# Patient Record
Sex: Male | Born: 1970
Health system: Southern US, Community
[De-identification: ages and names within clinical notes are randomized; demographics above are authoritative.]

## PROBLEM LIST (undated history)

## (undated) DIAGNOSIS — J189 Pneumonia, unspecified organism: Secondary | ICD-10-CM

## (undated) DIAGNOSIS — I2510 Atherosclerotic heart disease of native coronary artery without angina pectoris: Secondary | ICD-10-CM

## (undated) DIAGNOSIS — Z8619 Personal history of other infectious and parasitic diseases: Secondary | ICD-10-CM

## (undated) DIAGNOSIS — I219 Acute myocardial infarction, unspecified: Secondary | ICD-10-CM

## (undated) DIAGNOSIS — C801 Malignant (primary) neoplasm, unspecified: Secondary | ICD-10-CM

## (undated) DIAGNOSIS — K219 Gastro-esophageal reflux disease without esophagitis: Secondary | ICD-10-CM

## (undated) HISTORY — PX: BACK SURGERY: SHX140

## (undated) HISTORY — PX: CARDIAC CATHETERIZATION: SHX172

## (undated) SURGERY — COLONOSCOPY WITH PROPOFOL
Anesthesia: Monitor Anesthesia Care

---

## 1999-09-09 ENCOUNTER — Encounter: Payer: Self-pay | Admitting: Gastroenterology

## 1999-09-09 ENCOUNTER — Ambulatory Visit (HOSPITAL_COMMUNITY): Admission: RE | Admit: 1999-09-09 | Discharge: 1999-09-09 | Payer: Self-pay | Admitting: Gastroenterology

## 2006-07-20 ENCOUNTER — Encounter: Admission: RE | Admit: 2006-07-20 | Discharge: 2006-07-20 | Payer: Self-pay | Admitting: Gastroenterology

## 2013-09-12 ENCOUNTER — Other Ambulatory Visit: Payer: Self-pay | Admitting: Physician Assistant

## 2013-09-12 ENCOUNTER — Ambulatory Visit
Admission: RE | Admit: 2013-09-12 | Discharge: 2013-09-12 | Disposition: A | Discharge status: Home / Self Care | Payer: 59 | Source: Ambulatory Visit | Attending: Physician Assistant | Admitting: Physician Assistant

## 2013-09-12 DIAGNOSIS — R0789 Other chest pain: Secondary | ICD-10-CM

## 2014-07-07 ENCOUNTER — Ambulatory Visit
Admission: RE | Admit: 2014-07-07 | Discharge: 2014-07-07 | Disposition: A | Discharge status: Home / Self Care | Payer: 59 | Source: Ambulatory Visit | Attending: Family Medicine | Admitting: Family Medicine

## 2014-07-07 ENCOUNTER — Other Ambulatory Visit: Payer: Self-pay | Admitting: Family Medicine

## 2014-07-07 DIAGNOSIS — R0781 Pleurodynia: Secondary | ICD-10-CM

## 2014-07-08 ENCOUNTER — Other Ambulatory Visit: Payer: Self-pay | Admitting: Family Medicine

## 2014-07-08 DIAGNOSIS — M546 Pain in thoracic spine: Secondary | ICD-10-CM

## 2014-07-15 ENCOUNTER — Ambulatory Visit
Admission: RE | Admit: 2014-07-15 | Discharge: 2014-07-15 | Disposition: A | Discharge status: Home / Self Care | Payer: 59 | Source: Ambulatory Visit | Attending: Family Medicine | Admitting: Family Medicine

## 2014-07-15 DIAGNOSIS — M546 Pain in thoracic spine: Secondary | ICD-10-CM

## 2014-08-05 ENCOUNTER — Other Ambulatory Visit: Payer: 59

## 2016-01-15 ENCOUNTER — Other Ambulatory Visit: Payer: Self-pay | Admitting: Family Medicine

## 2016-01-15 DIAGNOSIS — M5431 Sciatica, right side: Secondary | ICD-10-CM

## 2016-01-17 ENCOUNTER — Other Ambulatory Visit: Payer: Self-pay

## 2016-01-21 ENCOUNTER — Ambulatory Visit
Admission: RE | Admit: 2016-01-21 | Discharge: 2016-01-21 | Disposition: A | Discharge status: Home / Self Care | Payer: Commercial Managed Care - HMO | Source: Ambulatory Visit | Attending: Family Medicine | Admitting: Family Medicine

## 2016-01-21 DIAGNOSIS — M5431 Sciatica, right side: Secondary | ICD-10-CM

## 2016-10-04 DIAGNOSIS — M5126 Other intervertebral disc displacement, lumbar region: Secondary | ICD-10-CM | POA: Diagnosis not present

## 2016-10-10 ENCOUNTER — Other Ambulatory Visit: Payer: Self-pay | Admitting: Family Medicine

## 2016-10-10 DIAGNOSIS — M5431 Sciatica, right side: Secondary | ICD-10-CM

## 2016-10-12 DIAGNOSIS — Z0000 Encounter for general adult medical examination without abnormal findings: Secondary | ICD-10-CM | POA: Diagnosis not present

## 2016-10-12 DIAGNOSIS — Z13220 Encounter for screening for lipoid disorders: Secondary | ICD-10-CM | POA: Diagnosis not present

## 2016-10-20 DIAGNOSIS — M5126 Other intervertebral disc displacement, lumbar region: Secondary | ICD-10-CM | POA: Diagnosis not present

## 2016-11-03 DIAGNOSIS — Z01812 Encounter for preprocedural laboratory examination: Secondary | ICD-10-CM | POA: Diagnosis not present

## 2016-11-03 DIAGNOSIS — M5126 Other intervertebral disc displacement, lumbar region: Secondary | ICD-10-CM | POA: Diagnosis not present

## 2016-11-08 DIAGNOSIS — M5127 Other intervertebral disc displacement, lumbosacral region: Secondary | ICD-10-CM | POA: Diagnosis not present

## 2016-11-08 DIAGNOSIS — M5117 Intervertebral disc disorders with radiculopathy, lumbosacral region: Secondary | ICD-10-CM | POA: Diagnosis not present

## 2018-04-17 DIAGNOSIS — L237 Allergic contact dermatitis due to plants, except food: Secondary | ICD-10-CM | POA: Diagnosis not present

## 2018-10-02 DIAGNOSIS — M2550 Pain in unspecified joint: Secondary | ICD-10-CM | POA: Diagnosis not present

## 2018-10-02 DIAGNOSIS — Z79899 Other long term (current) drug therapy: Secondary | ICD-10-CM | POA: Diagnosis not present

## 2018-10-02 DIAGNOSIS — R5382 Chronic fatigue, unspecified: Secondary | ICD-10-CM | POA: Diagnosis not present

## 2018-10-18 DIAGNOSIS — N529 Male erectile dysfunction, unspecified: Secondary | ICD-10-CM | POA: Diagnosis not present

## 2019-08-05 ENCOUNTER — Emergency Department (HOSPITAL_COMMUNITY): Payer: 59

## 2019-08-05 ENCOUNTER — Ambulatory Visit (HOSPITAL_COMMUNITY)
Admission: EM | Admit: 2019-08-05 | Discharge: 2019-08-05 | Disposition: A | Discharge status: Home / Self Care | Payer: 59 | Source: Home / Self Care

## 2019-08-05 ENCOUNTER — Encounter (HOSPITAL_COMMUNITY): Payer: Self-pay | Admitting: Family Medicine

## 2019-08-05 ENCOUNTER — Other Ambulatory Visit: Payer: Self-pay

## 2019-08-05 ENCOUNTER — Encounter (HOSPITAL_COMMUNITY): Payer: Self-pay | Admitting: Emergency Medicine

## 2019-08-05 ENCOUNTER — Inpatient Hospital Stay (HOSPITAL_COMMUNITY)
Admission: EM | Admit: 2019-08-05 | Discharge: 2019-08-08 | DRG: 247 | Disposition: A | Discharge status: Home / Self Care | Payer: 59 | Source: Ambulatory Visit | Attending: Internal Medicine | Admitting: Internal Medicine

## 2019-08-05 DIAGNOSIS — I2510 Atherosclerotic heart disease of native coronary artery without angina pectoris: Secondary | ICD-10-CM

## 2019-08-05 DIAGNOSIS — Z8249 Family history of ischemic heart disease and other diseases of the circulatory system: Secondary | ICD-10-CM

## 2019-08-05 DIAGNOSIS — I10 Essential (primary) hypertension: Secondary | ICD-10-CM | POA: Diagnosis present

## 2019-08-05 DIAGNOSIS — E785 Hyperlipidemia, unspecified: Secondary | ICD-10-CM | POA: Diagnosis present

## 2019-08-05 DIAGNOSIS — I200 Unstable angina: Secondary | ICD-10-CM

## 2019-08-05 DIAGNOSIS — I25110 Atherosclerotic heart disease of native coronary artery with unstable angina pectoris: Secondary | ICD-10-CM | POA: Diagnosis present

## 2019-08-05 DIAGNOSIS — K449 Diaphragmatic hernia without obstruction or gangrene: Secondary | ICD-10-CM | POA: Diagnosis present

## 2019-08-05 DIAGNOSIS — I214 Non-ST elevation (NSTEMI) myocardial infarction: Secondary | ICD-10-CM | POA: Diagnosis not present

## 2019-08-05 DIAGNOSIS — I255 Ischemic cardiomyopathy: Secondary | ICD-10-CM | POA: Diagnosis present

## 2019-08-05 DIAGNOSIS — Z955 Presence of coronary angioplasty implant and graft: Secondary | ICD-10-CM

## 2019-08-05 DIAGNOSIS — Z20828 Contact with and (suspected) exposure to other viral communicable diseases: Secondary | ICD-10-CM | POA: Diagnosis present

## 2019-08-05 DIAGNOSIS — R9431 Abnormal electrocardiogram [ECG] [EKG]: Secondary | ICD-10-CM

## 2019-08-05 DIAGNOSIS — Z823 Family history of stroke: Secondary | ICD-10-CM

## 2019-08-05 DIAGNOSIS — I472 Ventricular tachycardia: Secondary | ICD-10-CM | POA: Diagnosis present

## 2019-08-05 DIAGNOSIS — J302 Other seasonal allergic rhinitis: Secondary | ICD-10-CM | POA: Diagnosis present

## 2019-08-05 HISTORY — DX: Gastro-esophageal reflux disease without esophagitis: K21.9

## 2019-08-05 HISTORY — DX: Atherosclerotic heart disease of native coronary artery without angina pectoris: I25.10

## 2019-08-05 HISTORY — DX: Personal history of other infectious and parasitic diseases: Z86.19

## 2019-08-05 LAB — CBC
HCT: 45.5 % (ref 39.0–52.0)
Hemoglobin: 15 g/dL (ref 13.0–17.0)
MCH: 30.7 pg (ref 26.0–34.0)
MCHC: 33 g/dL (ref 30.0–36.0)
MCV: 93.2 fL (ref 80.0–100.0)
Platelets: 232 10*3/uL (ref 150–400)
RBC: 4.88 MIL/uL (ref 4.22–5.81)
RDW: 13.1 % (ref 11.5–15.5)
WBC: 8.5 10*3/uL (ref 4.0–10.5)
nRBC: 0 % (ref 0.0–0.2)

## 2019-08-05 LAB — BASIC METABOLIC PANEL
Anion gap: 14 (ref 5–15)
BUN: 10 mg/dL (ref 6–20)
CO2: 25 mmol/L (ref 22–32)
Calcium: 9.5 mg/dL (ref 8.9–10.3)
Chloride: 103 mmol/L (ref 98–111)
Creatinine, Ser: 0.96 mg/dL (ref 0.61–1.24)
GFR calc Af Amer: >60 mL/min (ref >60)
GFR calc non Af Amer: >60 mL/min (ref >60)
Glucose, Bld: 86 mg/dL (ref 70–99)
Potassium: 4.2 mmol/L (ref 3.5–5.1)
Sodium: 142 mmol/L (ref 135–145)

## 2019-08-05 LAB — HEMOGLOBIN A1C
Hgb A1c MFr Bld: 5.6 % (ref 4.8–5.6)
Mean Plasma Glucose: 114.02 mg/dL

## 2019-08-05 LAB — TROPONIN I (HIGH SENSITIVITY)
Troponin I (High Sensitivity): 2403 ng/L (ref <18)
Troponin I (High Sensitivity): 2143 ng/L (ref <18)

## 2019-08-05 LAB — SARS CORONAVIRUS 2 (TAT 6-24 HRS): SARS Coronavirus 2: NEGATIVE

## 2019-08-05 MED ORDER — ONDANSETRON HCL 4 MG/2ML IJ SOLN: 4.0000 mg | Freq: Four times a day (QID) | INTRAMUSCULAR | Status: DC | PRN

## 2019-08-05 MED ORDER — ASPIRIN EC 81 MG PO TBEC: 81.0000 mg | DELAYED_RELEASE_TABLET | Freq: Every day | ORAL | Status: DC

## 2019-08-05 MED ORDER — ASPIRIN 81 MG PO CHEW
324.0000 mg | CHEWABLE_TABLET | Freq: Once | ORAL | Status: AC
  Administered 2019-08-05: 324 mg via ORAL
  Filled 2019-08-05: qty 4

## 2019-08-05 MED ORDER — CARVEDILOL 3.125 MG PO TABS
3.1250 mg | ORAL_TABLET | Freq: Two times a day (BID) | ORAL | Status: DC
  Filled 2019-08-05: qty 1

## 2019-08-05 MED ORDER — NITROGLYCERIN 0.4 MG SL SUBL
0.4000 mg | SUBLINGUAL_TABLET | SUBLINGUAL | Status: DC | PRN
  Administered 2019-08-05: 0.4 mg via SUBLINGUAL
  Filled 2019-08-05 (×2): qty 1

## 2019-08-05 MED ORDER — NITROGLYCERIN 0.4 MG SL SUBL: 0.4000 mg | SUBLINGUAL_TABLET | SUBLINGUAL | Status: DC | PRN

## 2019-08-05 MED ORDER — SODIUM CHLORIDE 0.9% FLUSH
3.0000 mL | Freq: Two times a day (BID) | INTRAVENOUS | Status: DC
  Administered 2019-08-05 – 2019-08-08 (×4): 3 mL via INTRAVENOUS

## 2019-08-05 MED ORDER — NITROGLYCERIN IN D5W 200-5 MCG/ML-% IV SOLN
0.0000 ug/min | INTRAVENOUS | Status: DC
  Administered 2019-08-05: 5 ug/min via INTRAVENOUS
  Filled 2019-08-05: qty 250

## 2019-08-05 MED ORDER — SODIUM CHLORIDE 0.9% FLUSH
3.0000 mL | Freq: Once | INTRAVENOUS | Status: AC
  Administered 2019-08-05: 3 mL via INTRAVENOUS

## 2019-08-05 MED ORDER — ASPIRIN 81 MG PO CHEW: 324.0000 mg | CHEWABLE_TABLET | ORAL | Status: AC

## 2019-08-05 MED ORDER — ASPIRIN 300 MG RE SUPP: 300.0000 mg | RECTAL | Status: AC

## 2019-08-05 MED ORDER — HEPARIN BOLUS VIA INFUSION
4000.0000 [IU] | Freq: Once | INTRAVENOUS | Status: AC
  Administered 2019-08-05: 4000 [IU] via INTRAVENOUS
  Filled 2019-08-05: qty 4000

## 2019-08-05 MED ORDER — HEPARIN (PORCINE) 25000 UT/250ML-% IV SOLN
800.0000 [IU]/h | INTRAVENOUS | Status: DC
  Administered 2019-08-05: 1000 [IU]/h via INTRAVENOUS
  Filled 2019-08-05: qty 250

## 2019-08-05 MED ORDER — ACETAMINOPHEN 325 MG PO TABS
650.0000 mg | ORAL_TABLET | ORAL | Status: DC | PRN
  Administered 2019-08-06: 01:00:00 650 mg via ORAL
  Filled 2019-08-05: qty 2

## 2019-08-05 NOTE — ED Provider Notes (Signed)
[name]    CSN: [id number] Arrival date & time: 08/05/19  1433      History   Chief Complaint Chief Complaint  Patient presents with  . Chest Pain    HPI Jeff Barnes is a 48 y.o. male.   This is the initial Jeff Barnes urgent care visit for this 48 year old man who is complaining about chest pain for the last 2 weeks.  pcp instructed patient to go to ed.    Patient noticed chest pain 2 weeks ago while hiking.  Patient has noticed chest pain with and without exertion.  Patient has been taking ibuprofen for pain.  Patient reports ibuprofen helps pain.    Patient reports pain initially in center chest and radiates outward to both arms and down both arms.    Patient has a hiatal hernia.   Today noticed pain with the exertion of digging.    However, when he lies down in bed at night, pain reoccurs.    Patient gets sob, diarrhea, diaphoresis during pain episodes     History reviewed. No pertinent past medical history.  There are no active problems to display for this patient.   History reviewed. No pertinent surgical history.     Home Medications    Prior to Admission medications   Medication Sig Start Date End Date Taking? Authorizing Provider  Esomeprazole Magnesium (NEXIUM PO) Take by mouth.   Yes [provider]  Fexofenadine HCl (ALLEGRA PO) Take by mouth.   Yes [provider]    Family History Family History  Problem Relation Age of Onset  . Cancer Father   . CAD Father     Social History Social History   Tobacco Use  . Smoking status: Never Smoker  Substance Use Topics  . Alcohol use: Yes  . Drug use: Never     Allergies   Patient has no known allergies.   Review of Systems Review of Systems   Physical Exam Triage Vital Signs ED Triage Vitals  Enc Vitals Group     BP      Pulse      Resp      Temp      Temp src      SpO2      Weight      Height      Head Circumference    Peak Flow      Pain Score      Pain Loc      Pain Edu?      Excl. in [place]?    No data found.  Updated Vital Signs BP (!) 160/102 (BP Location: Right Arm)   Pulse (!) 103   Temp 98.2 F (36.8 C)   Resp 18   SpO2 100%    Physical Exam Vitals signs and nursing note reviewed.  Constitutional:      General: He is not in acute distress.    Appearance: He is well-developed and normal weight. He is not ill-appearing.  HENT:     Head: Normocephalic.  Cardiovascular:     Rate and Rhythm: Tachycardia present.     Heart sounds: Normal heart sounds.  Pulmonary:     Effort: Pulmonary effort is normal.     Breath sounds: Normal breath sounds.  Abdominal:     Palpations: Abdomen is soft.  Musculoskeletal:     Right lower leg: No edema.     Left lower leg: No edema.  Skin:    General: Skin  is warm.  Neurological:     General: No focal deficit present.     Mental Status: He is alert.  Psychiatric:        Mood and Affect: Mood normal.      UC Treatments / Results  Labs (all labs ordered are listed, but only abnormal results are displayed) Labs Reviewed - No data to display  EKG   Radiology No results found.  Procedures Procedures (including critical care time)  Medications Ordered in UC Medications - No data to display  Initial Impression / Assessment and Plan / UC Course  I have reviewed the triage vital signs and the nursing notes.  Pertinent labs & imaging results that were available during my care of the patient were reviewed by me and considered in my medical decision making (see chart for details).    Final Clinical Impressions(s) / UC Diagnoses   Final diagnoses:  Unstable angina (HCC)  Abnormal EKG     Discharge Instructions     To be treated in the emergency department    ED Prescriptions    None     I have reviewed the PDMP during this encounter.   Jeff Haber, MD 08/05/19 1555

## 2019-08-05 NOTE — ED Notes (Signed)
MD paged d/t pt's pain changing sides from R chest to L chest. MD asked to increase pt's intro. RN increased from 68mcg-10mcg.

## 2019-08-05 NOTE — ED Triage Notes (Signed)
pcp instructed patient to go to ed.    Patient noticed chest pain 2 weeks ago while hiking.  Patient has noticed chest pain with and without exertion.  Patient has been taking ibuprofen for pain.  Patient reports ibuprofen helps pain.    Patient reports pain initially in center chest and radiates outward to both arms and down both arms.    Patient has a hiatal hernia.   Today noticed pain with the exertion of digging.    However, when he lies down in bed at night, pain reoccurs.    Patient gets sob, diarrhea, diaphoresis during pain episodes

## 2019-08-05 NOTE — ED Provider Notes (Signed)
4:33 PM Patient seen by me briefly in triage.  Patient is a pleasant 48 year old male with no significant past medical history who presents from urgent care for further work-up of chest discomfort.  He reports that for the last 2 weeks he has intermittent episodes of a chest discomfort that feels like sharpness and pressure.  It radiates into both of his arms.  He reports nausea, diaphoresis, lightheadedness, and shortness of breath associated with it.  Reports the pain gets very severe and is a 3 out of 10 currently.  It is exertional.  It was worsened when he was hiking.  He reports that his father had heart troubles in his early 51s but the patient has personally never had any history of DVT, PE, or cardiac problems.  He had an EKG urgent care which was concerning prompting him to be sent to the emergency department.  On my brief exam, lungs are clear and chest was nontender.  No murmurs appreciated.  Patient had good pulses in upper extremities.  Patient was not diaphoretic.  I reviewed the EKG from urgent care and then EKG performed here immediately.  I was concerned that although the ST elevations are present in V3 and V4 have normalized, there does appear to be T wave inversion and depression in lead III which is new.  I called Dr. Ellyn Hack with the cardiology STEMI team who did not feel this met STEMI criteria but he agreed with work-up in the emergency department and likely cardiology consultation following.  Patient quickly placed into an exam room to be seen primarily by Dr. Tyrone Nine.  Anticipate Dr. Tyrone Nine working up the patient and speaking to cardiology after.   Kathrine Rieves, Gwenyth Allegra, MD 08/05/19 [phone]

## 2019-08-05 NOTE — ED Notes (Signed)
Pg stemi for doctor tegeler .

## 2019-08-05 NOTE — ED Triage Notes (Signed)
Pt arrives to ED with c/c of chest pain for the last 2 weeks that has seemed to come and go- per pt today he had to dig a grave for his sons dogs and pain got a lot worse- current pain is 4/10.

## 2019-08-05 NOTE — Discharge Instructions (Addendum)
To be treated in the emergency department

## 2019-08-05 NOTE — ED Provider Notes (Signed)
[place] EMERGENCY DEPARTMENT Provider Note   CSN: [id number] Arrival date & time: 08/05/19  1556     History   Chief Complaint Chief Complaint  Patient presents with   Chest Pain    HPI Jeff Barnes is a 48 y.o. male.     48 yo M with a chief complaint of chest pain.  This has been going on for about a week.  Happen first while he was hiking.  Complains of centralized chest pressure that radiates to bilateral arms causes him have shortness of breath diaphoresis and diarrhea.  Has never had anything like this before.  Has gotten better than it was a week ago but is still persistent.  Happens with exertion and improves with rest.  Denies cough congestion or fever denies abdominal pain denies vomiting.  Denies hypertension hyperlipidemia diabetes or smoking.  Father had an MI in his 68s.  Denies history of PE or DVT denies unilateral lower extremity edema denies hemoptysis denies recent surgery immobilization hospitalization or travel.  Denies testosterone use.  Denies history of cancer.  The history is provided by the patient.  Chest Pain Pain location:  Substernal area Pain quality: aching   Pain radiates to:  R shoulder and L shoulder Pain severity:  Severe Onset quality:  Gradual Duration:  1 week Timing:  Intermittent Progression:  Waxing and waning Chronicity:  New Relieved by:  Rest Worsened by:  Exertion Ineffective treatments:  None tried Associated symptoms: diaphoresis and shortness of breath   Associated symptoms: no abdominal pain, no fever, no headache, no palpitations and no vomiting     History reviewed. No pertinent past medical history.  There are no active problems to display for this patient.   No past surgical history on file.      Home Medications    Prior to Admission medications   Medication Sig Start Date End Date Taking? Authorizing Provider  Esomeprazole Magnesium (NEXIUM PO) Take by mouth.    [provider]  Fexofenadine HCl (ALLEGRA PO) Take by mouth.    [provider]    Family History Family History  Problem Relation Age of Onset   Cancer Father    CAD Father     Social History Social History   Tobacco Use   Smoking status: Never Smoker  Substance Use Topics   Alcohol use: Yes   Drug use: Never     Allergies   Patient has no known allergies.   Review of Systems Review of Systems  Constitutional: Positive for diaphoresis. Negative for chills and fever.  HENT: Negative for congestion and facial swelling.   Eyes: Negative for discharge and visual disturbance.  Respiratory: Positive for shortness of breath.   Cardiovascular: Positive for chest pain. Negative for palpitations.  Gastrointestinal: Positive for diarrhea. Negative for abdominal pain and vomiting.  Musculoskeletal: Negative for arthralgias and myalgias.  Skin: Negative for color change and rash.  Neurological: Negative for tremors, syncope and headaches.  Psychiatric/Behavioral: Negative for confusion and dysphoric mood.     Physical Exam Updated Vital Signs BP (!) 153/98    Pulse (!) 102    Temp 98.1 F (36.7 C) (Oral)    Resp (!) 22    Ht 5\' 11"  (1.803 m)    Wt 79.4 kg    SpO2 98%    BMI 24.41 kg/m   Physical Exam Vitals signs and nursing note reviewed.  Constitutional:      Appearance: He is well-developed.  HENT:     Head: Normocephalic and atraumatic.  Eyes:     Pupils: Pupils are equal, round, and reactive to light.  Neck:     Musculoskeletal: Normal range of motion and neck supple.     Vascular: JVD (just above the clavicles) present.  Cardiovascular:     Rate and Rhythm: Normal rate and regular rhythm.     Heart sounds: No murmur. No friction rub. No gallop.   Pulmonary:     Effort: No respiratory distress.     Breath sounds: No wheezing.  Abdominal:     General: There is no distension.     Tenderness: There is no guarding or rebound.  Musculoskeletal:  Normal range of motion.  Skin:    Coloration: Skin is not pale.     Findings: No rash.  Neurological:     Mental Status: He is alert and oriented to person, place, and time.  Psychiatric:        Behavior: Behavior normal.      ED Treatments / Results  Labs (all labs ordered are listed, but only abnormal results are displayed) Labs Reviewed  TROPONIN I (HIGH SENSITIVITY) - Abnormal; Notable for the following components:      Result Value   Troponin I (High Sensitivity) 2,403 (*)    All other components within normal limits  SARS CORONAVIRUS 2 (TAT 6-24 HRS)  BASIC METABOLIC PANEL  CBC  HEPARIN LEVEL (UNFRACTIONATED)  TROPONIN I (HIGH SENSITIVITY)    EKG EKG Interpretation  Date/Time:  Monday August 05 2019 16:00:29 EST Ventricular Rate:  108 PR Interval:  166 QRS Duration: 84 QT Interval:  326 QTC Calculation: 436 R Axis:   116 Text Interpretation: Sinus tachycardia Right axis deviation Septal infarct , age undetermined Abnormal ECG downsloping st depression in inferior leads st elevations in anterior leads now resolved Otherwise no significant change Confirmed by Deno Etienne [phone]) on 08/05/2019 4:51:25 PM   EKG Interpretation  Date/Time:  Monday August 05 2019 17:21:06 EST Ventricular Rate:  98 PR Interval:  166 QRS Duration: 81 QT Interval:  313 QTC Calculation: 400 R Axis:   107 Text Interpretation: Sinus rhythm Right axis deviation Probable anteroseptal infarct, old No significant change since last tracing Confirmed by Deno Etienne [phone]) on 08/05/2019 5:28:14 PM       Radiology No results found.  Procedures Procedures (including critical care time)  Medications Ordered in ED Medications  nitroGLYCERIN (NITROSTAT) SL tablet 0.4 mg (0.4 mg Sublingual Given 08/05/19 1643)  heparin ADULT infusion 100 units/mL (25000 units/228mL sodium chloride 0.45%) (1,000 Units/hr Intravenous New Bag/Given 08/05/19 1730)  nitroGLYCERIN 50 mg in dextrose 5 % 250 mL  (0.2 mg/mL) infusion (has no administration in time range)  sodium chloride flush (NS) 0.9 % injection 3 mL (3 mLs Intravenous Given 08/05/19 1648)  aspirin chewable tablet 324 mg (324 mg Oral Given 08/05/19 1639)  heparin bolus via infusion 4,000 Units (4,000 Units Intravenous Bolus from Bag 08/05/19 1730)     Initial Impression / Assessment and Plan / ED Course  I have reviewed the triage vital signs and the nursing notes.  Pertinent labs & imaging results that were available during my care of the patient were reviewed by me and considered in my medical decision making (see chart for details).        48 yo M with a chief complaint of chest pain.  This is typical in nature, he also had an EKG that was done at urgent care that  was concerning for an ST elevation MI.  Was sent urgently over here repeat EKG without ST elevations.  Case was discussed with the cardiologist who felt that he was not a STEMI currently.  With his dynamic EKG changes felt that he needed to be worked up and likely admitted.  The patient's troponin is elevated.  After some nitroglycerin tablets the patient's pain is down to a 1.  He is started on a heparin drip.  Discussed with cardiology.  Cards to admit.  CRITICAL CARE Performed by: Cecilio Asper   Total critical care time: 35 minutes  Critical care time was exclusive of separately billable procedures and treating other patients.  Critical care was necessary to treat or prevent imminent or life-threatening deterioration.  Critical care was time spent personally by me on the following activities: development of treatment plan with patient and/or surrogate as well as nursing, discussions with consultants, evaluation of patient's response to treatment, examination of patient, obtaining history from patient or surrogate, ordering and performing treatments and interventions, ordering and review of laboratory studies, ordering and review of radiographic  studies, pulse oximetry and re-evaluation of patient's condition.  The patients results and plan were reviewed and discussed.   Any x-rays performed were independently reviewed by myself.   Differential diagnosis were considered with the presenting HPI.  Medications  nitroGLYCERIN (NITROSTAT) SL tablet 0.4 mg (0.4 mg Sublingual Given 08/05/19 1643)  heparin ADULT infusion 100 units/mL (25000 units/259mL sodium chloride 0.45%) (1,000 Units/hr Intravenous New Bag/Given 08/05/19 1730)  nitroGLYCERIN 50 mg in dextrose 5 % 250 mL (0.2 mg/mL) infusion (has no administration in time range)  sodium chloride flush (NS) 0.9 % injection 3 mL (3 mLs Intravenous Given 08/05/19 1648)  aspirin chewable tablet 324 mg (324 mg Oral Given 08/05/19 1639)  heparin bolus via infusion 4,000 Units (4,000 Units Intravenous Bolus from Bag 08/05/19 1730)    Vitals:   08/05/19 1606 08/05/19 1630 08/05/19 1631 08/05/19 1714  BP: (!) 171/111 (!) 166/108  (!) 153/98  Pulse: (!) 110 (!) 108  (!) 102  Resp: 16 (!) 28  (!) 22  Temp: 98.1 F (36.7 C)     TempSrc: Oral     SpO2: 100% 98%  98%  Weight:   79.4 kg   Height:   5\' 11"  (1.803 m)     Final diagnoses:  NSTEMI (non-ST elevated myocardial infarction) [facility])    Admission/ observation were discussed with the admitting physician, patient and/or family and they are comfortable with the plan.    Final Clinical Impressions(s) / ED Diagnoses   Final diagnoses:  NSTEMI (non-ST elevated myocardial infarction) [facility])    ED Discharge Orders    None       Deno Etienne, DO 08/05/19 1745

## 2019-08-05 NOTE — H&P (Addendum)
History & Physical    Patient ID: Jeff Barnes MRN: [id number], DOB/AGE: 1971/02/21   Admit date: 08/05/2019   Primary Physician: Carol Ada, MD Primary Cardiologist: New   Patient Profile    Jeff Barnes is a 48 y.o. male with no prior past medical history who presented to [facility] on 08/05/2019 with a 1 week hx of chest pain .     Past Medical History   History reviewed. No pertinent past medical history.  No past surgical history on file.   Allergies  No Known Allergies  History of Present Illness    Jeff Barnes is a 48 year old male with a history stated above who presented to [facility] from an Urgent Care center for chest pain with an abnormal EKG. Jeff Barnes reports that his chest pain initially occurred while he was hiking last week. He describes the pain as a centralized chest pressure with radiation to his bilateral arms with associated shortness of breath, diaphoresis and diarrhea. He reports it is worse with exertion and improves with rest.  He denies CRFs such as hypertension, hyperlipidemia, DM2 or tobacco use. Does have a significant family history of MI in his father in his 59s.  Given his symptoms, he went to an outside urgent care center in which an EKG was performed with concern for ST elevation in leads V3 and V4 with T wave inversion and depression in lead III. EKG reviewed by interventional cardiologist, Dr. Ellyn Hack, who felt the EKG was not an acute STEMI however would require further ischemic work-up with cardiac catheterization.  HST found to be markedly elevated at 2403. Creatinine stable at 0.96.  All other lab work is within normal limits. CXR with.  Home Medications    Prior to Admission medications   Medication Sig Start Date End Date Taking? Authorizing Provider  Esomeprazole Magnesium (NEXIUM PO) Take by mouth.    [provider]  Fexofenadine HCl (ALLEGRA PO) Take by mouth.    [provider]    Family History     Family History  Problem Relation Age of Onset  . Cancer Father   . CAD Father    Social History    Social History   Socioeconomic History  . Marital status: Single    Spouse name: Not on file  . Number of children: Not on file  . Years of education: Not on file  . Highest education level: Not on file  Occupational History  . Not on file  Social Needs  . Financial resource strain: Not on file  . Food insecurity    Worry: Not on file    Inability: Not on file  . Transportation needs    Medical: Not on file    Non-medical: Not on file  Tobacco Use  . Smoking status: Never Smoker  Substance and Sexual Activity  . Alcohol use: Yes  . Drug use: Never  . Sexual activity: Not on file  Lifestyle  . Physical activity    Days per week: Not on file    Minutes per session: Not on file  . Stress: Not on file  Relationships  . Social Herbalist on phone: Not on file    Gets together: Not on file    Attends religious service: Not on file    Active member of club or organization: Not on file    Attends meetings of clubs or organizations: Not on file    Relationship status: Not on file  .  Intimate partner violence    Fear of current or ex partner: Not on file    Emotionally abused: Not on file    Physically abused: Not on file    Forced sexual activity: Not on file  Other Topics Concern  . Not on file  Social History Narrative  . Not on file    Review of Systems   General: Denies fevers, chills. Admits to good overall health. Cardiovascular: + chest pain. Denies palpitations and SOB at rest or with exertion. Denies lower extremity swelling.  Respiratory- + SOB.denies wheezing, coughing, increased respirations, or sputum production.   Gastrointestinal: + nausea.   Neurological: Denies syncope, falls, extremity weakness, numbness, and tingling. No changes in mental status. Denies abnormal anxiousness or restlessness.  All other systems reviewed and are otherwise  negative except as noted above.  Physical Exam    Blood pressure (!) 153/98, pulse (!) 102, temperature 98.1 F (36.7 C), temperature source Oral, resp. rate (!) 22, height 5\' 11"  (1.803 m), weight 79.4 kg, SpO2 98 %.   General: Well developed, well nourished, NAD Neck: Negative for carotid bruits. No JVD Lungs:Clear to ausculation bilaterally. No wheeze Cardiovascular: RRR with S1 S2. No murmur Abdomen: Soft, non-tender, non-distended. No obvious abdominal masses. Extremities: No edema. DP pulses 2+ bilaterally Neuro: Alert and oriented. No focal deficits. No facial asymmetry. MAE spontaneously. Psych: Responds to questions appropriately with normal affect.    Labs    Troponin (Point of Care Test) No results for input(s): TROPIPOC in the last 72 hours. No results for input(s): CKTOTAL, CKMB, TROPONINI in the last 72 hours. Lab Results  Component Value Date   WBC 8.5 08/05/2019   HGB 15.0 08/05/2019   HCT 45.5 08/05/2019   MCV 93.2 08/05/2019   PLT 232 08/05/2019    Recent Labs  Lab 08/05/19 1613  NA 142  K 4.2  CL 103  CO2 25  BUN 10  CREATININE 0.96  CALCIUM 9.5  GLUCOSE 86   No results found for: CHOL, HDL, LDLCALC, TRIG No results found for: [facility]   Radiology Studies    No results found.  ECG & Cardiac Imaging    08/05/2019: NSR with ST changes in lead III with Q waves in lead V2  Assessment & Plan    1.  Non-STEMI: -Patient presented to an UC center with a one week hx of chest pain which initially occurred while he was hiking last week. He describes the pain as a centralized chest pressure with radiation to his bilateral arms with associated shortness of breath, diaphoresis and diarrhea. He reports it is worse with exertion and improves with rest.   -No CRFs such as hypertension, hyperlipidemia, DM2 or tobacco use. -Continue IV heparin gtt for ACS -HsT>>2403>>>2143 -Keep n.p.o. after midnight and plan for cardiac catheterization for further ischemic  evaluation -Obtain lipid panel and HbA1c for risk stratification -Obtain echocardiogram to evaluate LV function -The patient understands that risks include but are not limited to stroke (1 in 1000), death (1 in 1000), kidney failure [usually temporary] (1 in 500), bleeding (1 in 200), allergic reaction [possibly serious] (1 in 200), and agrees to proceed.    Severity of Illness: The appropriate patient status for this patient is OBSERVATION. Observation status is judged to be reasonable and necessary in order to provide the required intensity of service to ensure the patient's safety. The patient's presenting symptoms, physical exam findings, and initial radiographic and laboratory data in the context of their medical condition  is felt to place them at decreased risk for further clinical deterioration. Furthermore, it is anticipated that the patient will be medically stable for discharge from the hospital within 2 midnights of admission. The following factors support the patient status of observation.   " The patient's presenting symptoms include chest pain . " The physical exam findings include elevated troponin. " The initial radiographic and laboratory data are elevated troponin and abnormal EKG.    Signed, Kathyrn Drown NP-C [facility] Pager: [phone] 08/05/2019, 7:28 PM  ---------------------------------------------------------------------------------------------   History and all data above reviewed.  Patient examined.  I agree with the findings as above.  Jeff Barnes is a pleasant 48 yo male with new onset chest pain that started while hiking and worsened on 07/31/2019. He has no significant past history. His family history is significant for father with MI and stroke. He is currently chest pain free. Troponin elevated at 2400 and downtrending.   Constitutional: No acute distress Eyes: pupils equally round and reactive to light, sclera non-icteric, normal conjunctiva and  lids ENMT: normal dentition, moist mucous membranes Cardiovascular: regular rhythm, normal rate, no murmurs. S1 and S2 normal. Radial pulses normal bilaterally. No jugular venous distention.  Respiratory: clear to auscultation bilaterally GI : normal bowel sounds, soft and nontender. No distention.   MSK: extremities warm, well perfused. No edema.  NEURO: grossly nonfocal exam, moves all extremities. PSYCH: alert and oriented x 3, normal mood and affect.   All available labs, radiology testing, previous records reviewed. Agree with documented assessment and plan of my colleague as stated above with the following additions or changes:  Active Problems:   NSTEMI (non-ST elevated myocardial infarction) ([place])    Plan: We will plan for coronary angiography tomorrow. Informed consent reviewed by Tonny Branch, NP and confirmed. We will also obtain an echo to ensure no evidence of WMA or systolic dysfunction, as patient is mildly tachycardic. No other evidence of HF.   Length of Stay:  LOS: 0 days   Elouise Munroe, MD [facility] 7:28 PM  08/05/2019

## 2019-08-05 NOTE — Progress Notes (Signed)
ANTICOAGULATION CONSULT NOTE - Initial Consult  Pharmacy Consult for heparin Indication: chest pain/ACS   Patient Measurements: Height: 5\' 11"  (180.3 cm) Weight: 175 lb (79.4 kg) IBW/kg (Calculated) : 75.3 Heparin Dosing Weight: 79.4 kg  Vital Signs: Temp: 98.1 F (36.7 C) (11/30 1606) Temp Source: Oral (11/30 1606) BP: 166/108 (11/30 1630) Pulse Rate: 108 (11/30 1630)  Labs: Recent Labs    08/05/19 1613  HGB 15.0  HCT 45.5  PLT 232     Assessment: 49 yo male with chest pain, sent to [facility] from urgent care. Troponin +, EKG changes +. SCr 0.9, h/h plts wnl. Starting heparin infusion while undergoing cardiac workup.   Goal of Therapy:  Heparin level 0.3-0.7 units/ml Monitor platelets by anticoagulation protocol: Yes    Plan:  -Heparin bolus 4000 units x1 then 1000 units/hr -Daily HL, CBC -Check level in 6 hours   Harvel Quale 08/05/2019,5:13 PM

## 2019-08-05 NOTE — ED Notes (Signed)
Patient is being discharged from the Urgent [facility] and sent to the Emergency Department via wheelchair by staff. Per dr Joseph Art, patient is stable but in need of higher level of care due to chest pain for 2 weeks, abnormal EKG. Patient is aware and verbalizes understanding of plan of care.  Vitals:   08/05/19 1534  BP: (!) 160/102  Pulse: (!) 103  Resp: 18  Temp: 98.2 F (36.8 C)  SpO2: 100%

## 2019-08-06 ENCOUNTER — Encounter (HOSPITAL_COMMUNITY): Payer: Self-pay | Admitting: General Practice

## 2019-08-06 ENCOUNTER — Other Ambulatory Visit (HOSPITAL_COMMUNITY): Payer: 59

## 2019-08-06 ENCOUNTER — Encounter (HOSPITAL_COMMUNITY)
Admission: EM | Disposition: A | Discharge status: Home / Self Care | Payer: Self-pay | Source: Home / Self Care | Attending: Internal Medicine

## 2019-08-06 DIAGNOSIS — Z8249 Family history of ischemic heart disease and other diseases of the circulatory system: Secondary | ICD-10-CM | POA: Diagnosis not present

## 2019-08-06 DIAGNOSIS — I2510 Atherosclerotic heart disease of native coronary artery without angina pectoris: Secondary | ICD-10-CM

## 2019-08-06 DIAGNOSIS — I25110 Atherosclerotic heart disease of native coronary artery with unstable angina pectoris: Secondary | ICD-10-CM | POA: Diagnosis present

## 2019-08-06 DIAGNOSIS — I255 Ischemic cardiomyopathy: Secondary | ICD-10-CM | POA: Diagnosis present

## 2019-08-06 DIAGNOSIS — I340 Nonrheumatic mitral (valve) insufficiency: Secondary | ICD-10-CM | POA: Diagnosis not present

## 2019-08-06 DIAGNOSIS — Z823 Family history of stroke: Secondary | ICD-10-CM | POA: Diagnosis not present

## 2019-08-06 DIAGNOSIS — I10 Essential (primary) hypertension: Secondary | ICD-10-CM | POA: Diagnosis present

## 2019-08-06 DIAGNOSIS — I472 Ventricular tachycardia: Secondary | ICD-10-CM | POA: Diagnosis present

## 2019-08-06 DIAGNOSIS — I214 Non-ST elevation (NSTEMI) myocardial infarction: Secondary | ICD-10-CM | POA: Diagnosis present

## 2019-08-06 DIAGNOSIS — J302 Other seasonal allergic rhinitis: Secondary | ICD-10-CM | POA: Diagnosis present

## 2019-08-06 DIAGNOSIS — Z20828 Contact with and (suspected) exposure to other viral communicable diseases: Secondary | ICD-10-CM | POA: Diagnosis present

## 2019-08-06 DIAGNOSIS — E785 Hyperlipidemia, unspecified: Secondary | ICD-10-CM | POA: Diagnosis present

## 2019-08-06 DIAGNOSIS — K449 Diaphragmatic hernia without obstruction or gangrene: Secondary | ICD-10-CM | POA: Diagnosis present

## 2019-08-06 HISTORY — PX: LEFT HEART CATH AND CORONARY ANGIOGRAPHY: CATH118249

## 2019-08-06 HISTORY — PX: CORONARY STENT INTERVENTION: CATH118234

## 2019-08-06 LAB — LIPID PANEL
Cholesterol: 178 mg/dL (ref 0–200)
HDL: 39 mg/dL — ABNORMAL LOW (ref >40)
LDL Cholesterol: 121 mg/dL — ABNORMAL HIGH (ref 0–99)
Total CHOL/HDL Ratio: 4.6 RATIO
Triglycerides: 90 mg/dL (ref <150)
VLDL: 18 mg/dL (ref 0–40)

## 2019-08-06 LAB — CBC
HCT: 43.8 % (ref 39.0–52.0)
HCT: 39.5 % (ref 39.0–52.0)
HCT: 38.7 % — ABNORMAL LOW (ref 39.0–52.0)
Hemoglobin: 14.3 g/dL (ref 13.0–17.0)
Hemoglobin: 12.8 g/dL — ABNORMAL LOW (ref 13.0–17.0)
Hemoglobin: 12.9 g/dL — ABNORMAL LOW (ref 13.0–17.0)
MCH: 30.6 pg (ref 26.0–34.0)
MCH: 30.6 pg (ref 26.0–34.0)
MCH: 30.5 pg (ref 26.0–34.0)
MCHC: 32.6 g/dL (ref 30.0–36.0)
MCHC: 32.4 g/dL (ref 30.0–36.0)
MCHC: 33.3 g/dL (ref 30.0–36.0)
MCV: 93.6 fL (ref 80.0–100.0)
MCV: 94.5 fL (ref 80.0–100.0)
MCV: 91.5 fL (ref 80.0–100.0)
Platelets: 205 10*3/uL (ref 150–400)
Platelets: 197 10*3/uL (ref 150–400)
Platelets: 207 10*3/uL (ref 150–400)
RBC: 4.68 MIL/uL (ref 4.22–5.81)
RBC: 4.18 MIL/uL — ABNORMAL LOW (ref 4.22–5.81)
RBC: 4.23 MIL/uL (ref 4.22–5.81)
RDW: 13.3 % (ref 11.5–15.5)
RDW: 13.2 % (ref 11.5–15.5)
RDW: 13.3 % (ref 11.5–15.5)
WBC: 7.3 10*3/uL (ref 4.0–10.5)
WBC: 9.8 10*3/uL (ref 4.0–10.5)
WBC: 9.4 10*3/uL (ref 4.0–10.5)
nRBC: 0 % (ref 0.0–0.2)
nRBC: 0 % (ref 0.0–0.2)
nRBC: 0 % (ref 0.0–0.2)

## 2019-08-06 LAB — HEPARIN LEVEL (UNFRACTIONATED)
Heparin Unfractionated: 0.9 IU/mL — ABNORMAL HIGH (ref 0.30–0.70)
Heparin Unfractionated: 0.95 IU/mL — ABNORMAL HIGH (ref 0.30–0.70)

## 2019-08-06 LAB — BASIC METABOLIC PANEL
Anion gap: 15 (ref 5–15)
BUN: 12 mg/dL (ref 6–20)
CO2: 20 mmol/L — ABNORMAL LOW (ref 22–32)
Calcium: 9.1 mg/dL (ref 8.9–10.3)
Chloride: 107 mmol/L (ref 98–111)
Creatinine, Ser: 0.87 mg/dL (ref 0.61–1.24)
GFR calc Af Amer: >60 mL/min (ref >60)
GFR calc non Af Amer: >60 mL/min (ref >60)
Glucose, Bld: 93 mg/dL (ref 70–99)
Potassium: 3.9 mmol/L (ref 3.5–5.1)
Sodium: 142 mmol/L (ref 135–145)

## 2019-08-06 LAB — TROPONIN I (HIGH SENSITIVITY)
Troponin I (High Sensitivity): 6442 ng/L (ref <18)
Troponin I (High Sensitivity): 6712 ng/L (ref <18)

## 2019-08-06 LAB — HIV ANTIBODY (ROUTINE TESTING W REFLEX): HIV Screen 4th Generation wRfx: NONREACTIVE

## 2019-08-06 LAB — POCT ACTIVATED CLOTTING TIME
Activated Clotting Time: 373 seconds
Activated Clotting Time: 555 seconds

## 2019-08-06 LAB — CREATININE, SERUM
Creatinine, Ser: 0.87 mg/dL (ref 0.61–1.24)
GFR calc Af Amer: >60 mL/min (ref >60)
GFR calc non Af Amer: >60 mL/min (ref >60)

## 2019-08-06 SURGERY — LEFT HEART CATH AND CORONARY ANGIOGRAPHY
Anesthesia: LOCAL

## 2019-08-06 MED ORDER — SODIUM CHLORIDE 0.9 % WEIGHT BASED INFUSION: 3.0000 mL/kg/h | INTRAVENOUS | Status: DC

## 2019-08-06 MED ORDER — MIDAZOLAM HCL 2 MG/2ML IJ SOLN
INTRAMUSCULAR | Status: DC | PRN
  Administered 2019-08-06: 2 mg via INTRAVENOUS
  Administered 2019-08-06 (×2): 1 mg via INTRAVENOUS

## 2019-08-06 MED ORDER — ACETAMINOPHEN 325 MG PO TABS
650.0000 mg | ORAL_TABLET | ORAL | Status: DC | PRN
  Administered 2019-08-06 (×2): 650 mg via ORAL
  Filled 2019-08-06 (×2): qty 2

## 2019-08-06 MED ORDER — CARVEDILOL 3.125 MG PO TABS
3.1250 mg | ORAL_TABLET | Freq: Two times a day (BID) | ORAL | Status: DC
  Administered 2019-08-06 – 2019-08-07 (×2): 3.125 mg via ORAL
  Filled 2019-08-06 (×2): qty 1

## 2019-08-06 MED ORDER — FENTANYL CITRATE (PF) 100 MCG/2ML IJ SOLN
INTRAMUSCULAR | Status: AC
  Filled 2019-08-06: qty 2

## 2019-08-06 MED ORDER — ATORVASTATIN CALCIUM 80 MG PO TABS
80.0000 mg | ORAL_TABLET | Freq: Every day | ORAL | Status: DC
  Administered 2019-08-06 – 2019-08-07 (×2): 80 mg via ORAL
  Filled 2019-08-06 (×2): qty 1

## 2019-08-06 MED ORDER — LIDOCAINE HCL (PF) 1 % IJ SOLN
INTRAMUSCULAR | Status: DC | PRN
  Administered 2019-08-06: 2 mL

## 2019-08-06 MED ORDER — SODIUM CHLORIDE 0.9% FLUSH
3.0000 mL | Freq: Two times a day (BID) | INTRAVENOUS | Status: DC
  Administered 2019-08-06 – 2019-08-08 (×4): 3 mL via INTRAVENOUS

## 2019-08-06 MED ORDER — CHLORHEXIDINE GLUCONATE CLOTH 2 % EX PADS
6.0000 | MEDICATED_PAD | Freq: Every day | CUTANEOUS | Status: DC
  Administered 2019-08-06 – 2019-08-08 (×3): 6 via TOPICAL

## 2019-08-06 MED ORDER — ASPIRIN 81 MG PO CHEW
81.0000 mg | CHEWABLE_TABLET | Freq: Every day | ORAL | Status: DC
  Administered 2019-08-07 – 2019-08-08 (×2): 81 mg via ORAL
  Filled 2019-08-06 (×2): qty 1

## 2019-08-06 MED ORDER — SODIUM CHLORIDE 0.9 % WEIGHT BASED INFUSION: 1.0000 mL/kg/h | INTRAVENOUS | Status: DC

## 2019-08-06 MED ORDER — LABETALOL HCL 5 MG/ML IV SOLN: 10.0000 mg | INTRAVENOUS | Status: AC | PRN

## 2019-08-06 MED ORDER — OXYCODONE-ACETAMINOPHEN 5-325 MG PO TABS
1.0000 | ORAL_TABLET | Freq: Once | ORAL | Status: AC
  Administered 2019-08-06: 16:00:00 1 via ORAL
  Filled 2019-08-06: qty 1

## 2019-08-06 MED ORDER — VERAPAMIL HCL 2.5 MG/ML IV SOLN
INTRAVENOUS | Status: DC | PRN
  Administered 2019-08-06: 10 mL via INTRA_ARTERIAL

## 2019-08-06 MED ORDER — ONDANSETRON HCL 4 MG/2ML IJ SOLN
INTRAMUSCULAR | Status: AC
  Filled 2019-08-06: qty 2

## 2019-08-06 MED ORDER — TIROFIBAN (AGGRASTAT) BOLUS VIA INFUSION
INTRAVENOUS | Status: DC | PRN
  Administered 2019-08-06: 1985 ug via INTRAVENOUS

## 2019-08-06 MED ORDER — NITROGLYCERIN IN D5W 200-5 MCG/ML-% IV SOLN: 0.0000 ug/min | INTRAVENOUS | Status: DC

## 2019-08-06 MED ORDER — NITROGLYCERIN 1 MG/10 ML FOR IR/CATH LAB
INTRA_ARTERIAL | Status: AC
  Filled 2019-08-06: qty 10

## 2019-08-06 MED ORDER — SODIUM CHLORIDE 0.9% FLUSH: 3.0000 mL | INTRAVENOUS | Status: DC | PRN

## 2019-08-06 MED ORDER — SODIUM CHLORIDE 0.9 % IV SOLN: 250.0000 mL | INTRAVENOUS | Status: DC | PRN

## 2019-08-06 MED ORDER — TIROFIBAN HCL IN NACL 5-0.9 MG/100ML-% IV SOLN
INTRAVENOUS | Status: AC | PRN
  Administered 2019-08-06: 0.15 ug/kg/min via INTRAVENOUS

## 2019-08-06 MED ORDER — HEPARIN SODIUM (PORCINE) 1000 UNIT/ML IJ SOLN
INTRAMUSCULAR | Status: DC | PRN
  Administered 2019-08-06: 3000 [IU] via INTRAVENOUS
  Administered 2019-08-06: 4000 [IU] via INTRAVENOUS
  Administered 2019-08-06: 6000 [IU] via INTRAVENOUS

## 2019-08-06 MED ORDER — SODIUM CHLORIDE 0.9 % WEIGHT BASED INFUSION
3.0000 mL/kg/h | INTRAVENOUS | Status: DC
  Administered 2019-08-06: 06:00:00 3 mL/kg/h via INTRAVENOUS

## 2019-08-06 MED ORDER — IOHEXOL 350 MG/ML SOLN
INTRAVENOUS | Status: DC | PRN
  Administered 2019-08-06: 300 mL

## 2019-08-06 MED ORDER — SODIUM CHLORIDE 0.9 % IV SOLN: INTRAVENOUS | Status: AC

## 2019-08-06 MED ORDER — HEPARIN SODIUM (PORCINE) 1000 UNIT/ML IJ SOLN
INTRAMUSCULAR | Status: AC
  Filled 2019-08-06: qty 1

## 2019-08-06 MED ORDER — ASPIRIN 81 MG PO CHEW
81.0000 mg | CHEWABLE_TABLET | ORAL | Status: AC
  Administered 2019-08-06: 06:00:00 81 mg via ORAL
  Filled 2019-08-06: qty 1

## 2019-08-06 MED ORDER — HEPARIN (PORCINE) IN NACL 1000-0.9 UT/500ML-% IV SOLN
INTRAVENOUS | Status: AC
  Filled 2019-08-06: qty 1000

## 2019-08-06 MED ORDER — SODIUM CHLORIDE 0.9 % IV SOLN
INTRAVENOUS | Status: AC
  Administered 2019-08-06: 16:00:00 via INTRAVENOUS

## 2019-08-06 MED ORDER — ONDANSETRON HCL 4 MG/2ML IJ SOLN
INTRAMUSCULAR | Status: DC | PRN
  Administered 2019-08-06: 4 mg via INTRAVENOUS

## 2019-08-06 MED ORDER — VERAPAMIL HCL 2.5 MG/ML IV SOLN
INTRAVENOUS | Status: AC
  Filled 2019-08-06: qty 2

## 2019-08-06 MED ORDER — MIDAZOLAM HCL 2 MG/2ML IJ SOLN
INTRAMUSCULAR | Status: AC
  Filled 2019-08-06: qty 2

## 2019-08-06 MED ORDER — TICAGRELOR 90 MG PO TABS
ORAL_TABLET | ORAL | Status: AC
  Filled 2019-08-06: qty 2

## 2019-08-06 MED ORDER — TICAGRELOR 90 MG PO TABS
ORAL_TABLET | ORAL | Status: DC | PRN
  Administered 2019-08-06: 180 mg via ORAL

## 2019-08-06 MED ORDER — LIDOCAINE HCL (PF) 1 % IJ SOLN
INTRAMUSCULAR | Status: AC
  Filled 2019-08-06: qty 30

## 2019-08-06 MED ORDER — ONDANSETRON HCL 4 MG/2ML IJ SOLN: 4.0000 mg | Freq: Four times a day (QID) | INTRAMUSCULAR | Status: DC | PRN

## 2019-08-06 MED ORDER — ENOXAPARIN SODIUM 40 MG/0.4ML ~~LOC~~ SOLN
40.0000 mg | SUBCUTANEOUS | Status: DC
  Administered 2019-08-07 – 2019-08-08 (×2): 40 mg via SUBCUTANEOUS
  Filled 2019-08-06 (×2): qty 0.4

## 2019-08-06 MED ORDER — NITROGLYCERIN 1 MG/10 ML FOR IR/CATH LAB
INTRA_ARTERIAL | Status: DC | PRN
  Administered 2019-08-06 (×5): 200 ug via INTRACORONARY

## 2019-08-06 MED ORDER — TIROFIBAN HCL IN NACL 5-0.9 MG/100ML-% IV SOLN
0.0750 ug/kg/min | INTRAVENOUS | Status: AC
  Administered 2019-08-06: 0.075 ug/kg/min via INTRAVENOUS
  Filled 2019-08-06: qty 250
  Filled 2019-08-06: qty 100

## 2019-08-06 MED ORDER — TIROFIBAN HCL IN NACL 5-0.9 MG/100ML-% IV SOLN
INTRAVENOUS | Status: AC
  Filled 2019-08-06: qty 100

## 2019-08-06 MED ORDER — HEPARIN (PORCINE) IN NACL 1000-0.9 UT/500ML-% IV SOLN
INTRAVENOUS | Status: DC | PRN
  Administered 2019-08-06 (×2): 500 mL

## 2019-08-06 MED ORDER — ASPIRIN 81 MG PO CHEW: 81.0000 mg | CHEWABLE_TABLET | ORAL | Status: DC

## 2019-08-06 MED ORDER — TICAGRELOR 90 MG PO TABS
90.0000 mg | ORAL_TABLET | Freq: Two times a day (BID) | ORAL | Status: DC
  Administered 2019-08-06 – 2019-08-08 (×4): 90 mg via ORAL
  Filled 2019-08-06 (×4): qty 1

## 2019-08-06 MED ORDER — HYDRALAZINE HCL 20 MG/ML IJ SOLN: 10.0000 mg | INTRAMUSCULAR | Status: AC | PRN

## 2019-08-06 MED ORDER — SODIUM CHLORIDE 0.9 % WEIGHT BASED INFUSION
1.0000 mL/kg/h | INTRAVENOUS | Status: DC
  Administered 2019-08-06: 1 mL/kg/h via INTRAVENOUS

## 2019-08-06 MED ORDER — ASPIRIN EC 81 MG PO TBEC: 81.0000 mg | DELAYED_RELEASE_TABLET | Freq: Every day | ORAL | Status: DC

## 2019-08-06 MED ORDER — DIAZEPAM 5 MG PO TABS: 5.0000 mg | ORAL_TABLET | Freq: Four times a day (QID) | ORAL | Status: DC | PRN

## 2019-08-06 MED ORDER — FENTANYL CITRATE (PF) 100 MCG/2ML IJ SOLN
INTRAMUSCULAR | Status: DC | PRN
  Administered 2019-08-06 (×3): 25 ug via INTRAVENOUS
  Administered 2019-08-06: 50 ug via INTRAVENOUS

## 2019-08-06 SURGICAL SUPPLY — 20 items
BALLN SAPPHIRE 2.0X12 (BALLOONS) ×2
BALLN SAPPHIRE 2.5X12 (BALLOONS) ×2
BALLN SAPPHIRE ~~LOC~~ 3.25X12 (BALLOONS) ×1 IMPLANT
BALLOON SAPPHIRE 2.0X12 (BALLOONS) IMPLANT
BALLOON SAPPHIRE 2.5X12 (BALLOONS) IMPLANT
CATH INFINITI 5FR ANG PIGTAIL (CATHETERS) ×1 IMPLANT
CATH OPTITORQUE TIG 4.0 5F (CATHETERS) ×1 IMPLANT
CATH VISTA GUIDE 6FR XBLAD3.5 (CATHETERS) ×1 IMPLANT
DEVICE RAD COMP TR BAND LRG (VASCULAR PRODUCTS) ×1 IMPLANT
GLIDESHEATH SLEND SS 6F .021 (SHEATH) ×1 IMPLANT
GUIDEWIRE INQWIRE 1.5J.035X260 (WIRE) IMPLANT
INQWIRE 1.5J .035X260CM (WIRE) ×2
KIT ENCORE 26 ADVANTAGE (KITS) ×1 IMPLANT
KIT HEART LEFT (KITS) ×2 IMPLANT
PACK CARDIAC CATHETERIZATION (CUSTOM PROCEDURE TRAY) ×2 IMPLANT
STENT RESOLUTE ONYX 3.0X18 (Permanent Stent) ×1 IMPLANT
TRANSDUCER W/STOPCOCK (MISCELLANEOUS) ×2 IMPLANT
TUBING CIL FLEX 10 FLL-RA (TUBING) ×2 IMPLANT
WIRE COUGAR XT STRL 190CM (WIRE) ×1 IMPLANT
WIRE PT2 MS 185 (WIRE) ×1 IMPLANT

## 2019-08-06 NOTE — Progress Notes (Signed)
I have reviewed the images from the patient's angiogram with the interventional cardiologist Dr. Shelva Majestic.  The patient had an ostial LAD occlusion that was successfully stented.  Dr. Claiborne Billings has ordered aspirin 81 mg daily, ticagrelor 90 mg twice daily, atorvastatin 80 mg daily, carvedilol 3.125 mg twice daily.  We will obtain an echocardiogram for LV function.

## 2019-08-06 NOTE — Progress Notes (Signed)
ANTICOAGULATION CONSULT NOTE   Pharmacy Consult for heparin/tirofiban Indication: chest pain/ACS   Patient Measurements: Height: 5\' 11"  (180.3 cm) Weight: 175 lb (79.4 kg) IBW/kg (Calculated) : 75.3 Heparin Dosing Weight: 79.4 kg  Vital Signs: Temp: 98.8 F (37.1 C) (12/01 1100) Temp Source: Oral (12/01 1100) BP: 117/81 (12/01 1135) Pulse Rate: 105 (12/01 1135)  Labs: Recent Labs    08/05/19 1613 08/05/19 1807 08/05/19 2300 08/06/19 0407  HGB 15.0  --   --  14.3  HCT 45.5  --   --  43.8  PLT 232  --   --  205  HEPARINUNFRC  --   --  0.90*  --   CREATININE 0.96  --   --  0.87  TROPONINIHS 2,403* 2,143*  --   --      Assessment: 48 yo male with chest pain/NSTEMI now s/p LHC and coronary angio. Pharmacy consulted for tirofiban 18hr post cath. Heparin was stopped before cath.   Heparin level 0.87 (supratherapeutic) on gtt at 800 units/hr but was drawn 2 hr after rate decrease so does not reflect true level.  Goal of Therapy:  Heparin level 0.3-0.7 units/ml Monitor platelets by anticoagulation protocol: Yes   Plan:  -Tirofiban 0.090mcg/kg/min for 18 hrs post cath -Continue to hold heparin  -CBC ~6hr after tirofiban started in cath lab -Monitor for signs of bleeding   Benetta Spar, PharmD, BCPS, BCCP Clinical Pharmacist  Please check AMION for all [place] phone numbers After 10:00 PM, call [place]

## 2019-08-06 NOTE — Care Management (Signed)
08-06-19 Benefits Check submitted for Brilinta. CM will make patient aware of cost once completed. Bethena Roys , RN,BSN Case Manager [phone]

## 2019-08-06 NOTE — ED Notes (Signed)
Pt's cardiac cath informed consent at bedside.

## 2019-08-06 NOTE — Progress Notes (Signed)
ANTICOAGULATION CONSULT NOTE   Pharmacy Consult for heparin Indication: chest pain/ACS   Patient Measurements: Height: 5\' 11"  (180.3 cm) Weight: 175 lb (79.4 kg) IBW/kg (Calculated) : 75.3 Heparin Dosing Weight: 79.4 kg  Vital Signs: Temp: 98.1 F (36.7 C) (11/30 1606) Temp Source: Oral (11/30 1606) BP: 143/94 (12/01 0100) Pulse Rate: 92 (12/01 0100)  Labs: Recent Labs    08/05/19 1613 08/05/19 1807 08/05/19 2300  HGB 15.0  --   --   HCT 45.5  --   --   PLT 232  --   --   HEPARINUNFRC  --   --  0.90*  CREATININE 0.96  --   --   TROPONINIHS 2,403* 2,143*  --      Assessment: 48 yo male with chest pain, sent to [facility] from urgent care. Troponin +, EKG changes +. SCr 0.9, h/h plts wnl. Starting heparin infusion while undergoing cardiac workup.  Heparin level 0.9 (supratherapeutic) on gtt at 1000 units/hr. No issues with line or bleeding reported per RN.  Goal of Therapy:  Heparin level 0.3-0.7 units/ml Monitor platelets by anticoagulation protocol: Yes   Plan:  -Decrease heparin to 800 units/hr -Check level in 6 hours  Sherlon Handing, PharmD, BCPS Please see amion for complete clinical pharmacist phone list 08/06/2019,1:56 AM

## 2019-08-06 NOTE — Interval H&P Note (Signed)
Cath Lab Visit (complete for each Cath Lab visit)  Clinical Evaluation Leading to the Procedure:   ACS: Yes.    Non-ACS:    Anginal Classification: CCS III  Anti-ischemic medical therapy: Minimal Therapy (1 class of medications)  Non-Invasive Test Results: No non-invasive testing performed  Prior CABG: No previous CABG      History and Physical Interval Note:  08/06/2019 7:44 AM  Arlon A Calandrino  has presented today for surgery, with the diagnosis of unstable angina.  The various methods of treatment have been discussed with the patient and family. After consideration of risks, benefits and other options for treatment, the patient has consented to  Procedure(s): LEFT HEART CATH AND CORONARY ANGIOGRAPHY (N/A) as a surgical intervention.  The patient's history has been reviewed, patient examined, no change in status, stable for surgery.  I have reviewed the patient's chart and labs.  Questions were answered to the patient's satisfaction.     Shelva Majestic

## 2019-08-07 ENCOUNTER — Encounter (HOSPITAL_COMMUNITY): Payer: Self-pay | Admitting: Cardiovascular Disease

## 2019-08-07 ENCOUNTER — Inpatient Hospital Stay (HOSPITAL_COMMUNITY): Payer: 59

## 2019-08-07 ENCOUNTER — Telehealth: Payer: Self-pay

## 2019-08-07 DIAGNOSIS — I340 Nonrheumatic mitral (valve) insufficiency: Secondary | ICD-10-CM

## 2019-08-07 LAB — BASIC METABOLIC PANEL
Anion gap: 8 (ref 5–15)
BUN: 7 mg/dL (ref 6–20)
CO2: 24 mmol/L (ref 22–32)
Calcium: 8.5 mg/dL — ABNORMAL LOW (ref 8.9–10.3)
Chloride: 107 mmol/L (ref 98–111)
Creatinine, Ser: 0.72 mg/dL (ref 0.61–1.24)
GFR calc Af Amer: >60 mL/min (ref >60)
GFR calc non Af Amer: >60 mL/min (ref >60)
Glucose, Bld: 93 mg/dL (ref 70–99)
Potassium: 3.7 mmol/L (ref 3.5–5.1)
Sodium: 139 mmol/L (ref 135–145)

## 2019-08-07 LAB — CBC
HCT: 38.9 % — ABNORMAL LOW (ref 39.0–52.0)
Hemoglobin: 12.7 g/dL — ABNORMAL LOW (ref 13.0–17.0)
MCH: 30.5 pg (ref 26.0–34.0)
MCHC: 32.6 g/dL (ref 30.0–36.0)
MCV: 93.3 fL (ref 80.0–100.0)
Platelets: 217 10*3/uL (ref 150–400)
RBC: 4.17 MIL/uL — ABNORMAL LOW (ref 4.22–5.81)
RDW: 13.4 % (ref 11.5–15.5)
WBC: 7.8 10*3/uL (ref 4.0–10.5)
nRBC: 0 % (ref 0.0–0.2)

## 2019-08-07 LAB — ECHOCARDIOGRAM COMPLETE
Height: 71 in
Weight: 2758.4 oz

## 2019-08-07 MED ORDER — ALUM & MAG HYDROXIDE-SIMETH 200-200-20 MG/5ML PO SUSP
30.0000 mL | ORAL | Status: DC | PRN
  Administered 2019-08-07: 14:00:00 30 mL via ORAL
  Filled 2019-08-07: qty 30

## 2019-08-07 MED ORDER — FLUTICASONE PROPIONATE 50 MCG/ACT NA SUSP
2.0000 | Freq: Every day | NASAL | Status: DC | PRN
  Administered 2019-08-07: 10:00:00 2 via NASAL
  Filled 2019-08-07: qty 16

## 2019-08-07 MED ORDER — CARVEDILOL 6.25 MG PO TABS
6.2500 mg | ORAL_TABLET | Freq: Two times a day (BID) | ORAL | Status: DC
  Administered 2019-08-07 – 2019-08-08 (×2): 6.25 mg via ORAL
  Filled 2019-08-07 (×2): qty 1

## 2019-08-07 MED ORDER — POTASSIUM CHLORIDE CRYS ER 20 MEQ PO TBCR
40.0000 meq | EXTENDED_RELEASE_TABLET | Freq: Once | ORAL | Status: AC
  Administered 2019-08-07: 10:00:00 40 meq via ORAL
  Filled 2019-08-07: qty 2

## 2019-08-07 MED ORDER — FLUTICASONE PROPIONATE 50 MCG/ACT NA SUSP: 2.0000 | Freq: Every day | NASAL | Status: DC

## 2019-08-07 MED ORDER — CARVEDILOL 3.125 MG PO TABS: 3.1250 mg | ORAL_TABLET | Freq: Once | ORAL | Status: AC

## 2019-08-07 NOTE — Telephone Encounter (Signed)
Hospital Follow-Up Received: Today Message Contents  Darreld Mclean, PA-C  P Cv Div Nl Toc        Patient already has TOC visit scheduled for 08/15/2019 at 9:20am with Dr. Margaretann Loveless. He will just need TOC phone call.   Thank you!  Callie       Pt currently admitted as of 08/05/2019

## 2019-08-07 NOTE — Progress Notes (Signed)
  Echocardiogram 2D Echocardiogram has been performed.  Bertrand Vowels G Shailynn Fong 08/07/2019, 10:19 AM

## 2019-08-07 NOTE — Progress Notes (Addendum)
Progress Note  Patient Name: Jeff Barnes Date of Encounter: 08/07/2019  Primary Cardiologist: Dr. Elouise Munroe   Subjective   Feels well, just endorses allergy symptoms. Would like to go home when ready. No chest pain or SOB.  Inpatient Medications    Scheduled Meds:  aspirin  81 mg Oral Daily   atorvastatin  80 mg Oral q1800   carvedilol  3.125 mg Oral Once   carvedilol  6.25 mg Oral BID WC   Chlorhexidine Gluconate Cloth  6 each Topical Daily   enoxaparin (LOVENOX) injection  40 mg Subcutaneous Q24H   potassium chloride  40 mEq Oral Once   sodium chloride flush  3 mL Intravenous Q12H   sodium chloride flush  3 mL Intravenous Q12H   ticagrelor  90 mg Oral BID   Continuous Infusions:  sodium chloride     sodium chloride     nitroGLYCERIN Stopped (08/06/19 1851)   PRN Meds: sodium chloride, sodium chloride, acetaminophen, diazepam, nitroGLYCERIN, ondansetron (ZOFRAN) IV, sodium chloride flush, sodium chloride flush   Vital Signs    Vitals:   08/07/19 0640 08/07/19 0700 08/07/19 0741 08/07/19 0900  BP: 136/89 128/85  (!) 143/93  Pulse: 90 100  93  Resp: 19 18  (!) 22  Temp:   98.5 F (36.9 C)   TempSrc:   Oral   SpO2: 98% 97%  99%  Weight:      Height:        Intake/Output Summary (Last 24 hours) at 08/07/2019 0909 Last data filed at 08/07/2019 0700 Gross per 24 hour  Intake 2413.08 ml  Output 600 ml  Net 1813.08 ml   Last 3 Weights 08/07/2019 08/05/2019  Weight (lbs) 172 lb 6.4 oz 175 lb  Weight (kg) 78.2 kg 79.379 kg      Telemetry    2 episodes of NSVT, 6 beats and 7 beats on 12/1, otherwise SR - Personally Reviewed  ECG    NSR, anteroseptal infarct - Personally Reviewed  Physical Exam   GEN: No acute distress.   Neck: No JVD Cardiac: RRR, no murmurs, rubs, or gallops.  Respiratory: Clear to auscultation bilaterally. GI: Soft, nontender, non-distended  MS: No edema; No deformity. Neuro:  Nonfocal  Psych: Normal  affect   Labs    High Sensitivity Troponin:   Recent Labs  Lab 08/05/19 1613 08/05/19 1807 08/06/19 1131 08/06/19 1320  TROPONINIHS 2,403* 2,143* 6,442* 6,712*      Chemistry Recent Labs  Lab 08/05/19 1613 08/06/19 0407 08/06/19 1131 08/07/19 0209  NA 142 142  --  139  K 4.2 3.9  --  3.7  CL 103 107  --  107  CO2 25 20*  --  24  GLUCOSE 86 93  --  93  BUN 10 12  --  7  CREATININE 0.96 0.87 0.87 0.72  CALCIUM 9.5 9.1  --  8.5*  GFRNONAA >60 >60 >60 >60  GFRAA >60 >60 >60 >60  ANIONGAP 14 15  --  8     Hematology Recent Labs  Lab 08/06/19 1131 08/06/19 1600 08/07/19 0209  WBC 9.8 9.4 7.8  RBC 4.18* 4.23 4.17*  HGB 12.8* 12.9* 12.7*  HCT 39.5 38.7* 38.9*  MCV 94.5 91.5 93.3  MCH 30.6 30.5 30.5  MCHC 32.4 33.3 32.6  RDW 13.2 13.3 13.4  PLT 197 207 217    BNPNo results for input(s): BNP, PROBNP in the last 168 hours.   DDimer No results for input(s): DDIMER  in the last 168 hours.   Radiology    Dg Chest Port 1 View  Result Date: 08/05/2019 CLINICAL DATA:  Chest pain EXAM: PORTABLE CHEST 1 VIEW COMPARISON:  07/07/2014 FINDINGS: The heart size and mediastinal contours are within normal limits. Both lungs are clear. The visualized skeletal structures are unremarkable. IMPRESSION: No active disease. Electronically Signed   By: Donavan Foil M.D.   On: 08/05/2019 18:16    Cardiac Studies   Echo pending.  Cath:  Ost LAD to Prox LAD lesion is 100% stenosed.  Post intervention, there is a 0% residual stenosis.  Mid LAD lesion is 50% stenosed.  A stent was successfully placed.   Recent acute coronary syndrome secondary to total ostial occlusion of a large LAD.  Normal large ramus intermediate, dominant left circumflex and nondominant RCA.  There is  faint distal LAD collateralization from the ramus and left circumflex injection.  Mild to moderate LV dysfunction with mid distal anterolateral hypocontractility and estimated EF at approximately 40%.   LVEDP is 11 mm.  Difficult but successful percutaneous coronary intervention with ultimate insertion of a 3.0 x 18 mm Resolute Onyx DES stent postdilated to 3.25 mm with the 100% ostial LAD occlusion being reduced to 0% and TIMI 0 flow being improved to TIMI-3 flow.  Once initially dilated with PTCA there was evidence for ulcerated plaque with thrombus and Aggrastat bolus plus infusion was administered.  Once the LAD was open, there appears to be a smooth 50% narrowing in the mid LAD with a component of systolic muscle bridging.  RECOMMENDATION: DAPT for minimum of 1 year but possibly longer due to the location of the stent.  Initiation of aggressive lipid-lowering therapy with target LDL less than 70.  A 2D echo Doppler study will be performed for improved assessment of LV function.  Initiate carvedilol post MI and initiate ACE-I orARB or possibly Entresto depending upon LV function.   Patient Profile     Jeff A Wilsonis a 48 y.o.male with no prior past medical history who presented to [facility] on 08/05/2019 with a 2 week hx of chest pain .   Assessment & Plan    1. NSTEMI - ostial LAD 100% occlusion, received DES to the LAD. Thrombus present at site of lesion. Continue ASA and Ticagrelor. Continue atorvastatin 80 mg daily. Will increase carvedilol to 6.25 mg BID. Consider ACE ARB ARNI depending on echo. EF appears mildly decreased on LV gram per interventional note. HBA1c normal.  Residual mid LAD disease with bridging, monitor for symptoms and continue GDMT.  2. HLD - continue atorvastatin, recheck lipids in 3 mo for response to therapy LDL is 121.  3. NSVT- monitor on tele.   4. Seasonal allergies - provide flonase per patient request.     For questions or updates, please contact [place] Please consult [email or web address] for contact info under        Signed, Elouise Munroe, MD  08/07/2019, 9:09 AM

## 2019-08-07 NOTE — TOC Benefit Eligibility Note (Signed)
Transition of Care [facility]) Benefit Eligibility Note    Patient Details  Name: Jeff Barnes MRN: [id number] Date of Birth: 1970/11/13   Medication/Dose: Jeff Barnes 90mg  BID  Covered?: No     Prescription Coverage Preferred Pharmacy: [facility]  Spoke with Person/Company/Phone Number:: [employer]       Additional Notes: Clopidogrel is the alternative listed $10.00 for 30 day retail    [employer] Phone Number: 08/07/2019, 8:06 AM

## 2019-08-07 NOTE — Care Management (Signed)
08-07-19 1136 Benefits check completed for Brilinta. Per CMA- Insurance will not pay for Brilinta. CM did make Pharmacist Pilar Plate aware that the patient will need an alternative medication post 30 day free of Brilinta. Patient is agreeable to use the Transition of Care [facility]) Pharmacy. CM will continue to follow for transition of care needs. Bethena Roys, RN,BSN Case Manager [phone]

## 2019-08-07 NOTE — Progress Notes (Signed)
CARDIAC REHAB PHASE I   PRE:  Rate/Rhythm: 89 SR  BP:  Supine:   Sitting: 143/102  Standing:    SaO2: 99%RA  MODE:  Ambulation: 660 ft   POST:  Rate/Rhythm: 101 ST  BP:  Supine:   Sitting: 152/95  Standing:    SaO2: 98%RA 1020-1110 Pt walked 660 ft on RA with steady gait and tolerated well. No CP. MI education completed who voiced understanding. Stressed importance of brilinta with stent. Reviewed NTG use, MI restrictions, heart healthy food choices, walking for ex, risk factors, and CRP 2. Referred to [place].  Pt is interested in participating in Virtual Cardiac and Pulmonary Rehab. Pt advised that Virtual Cardiac and Pulmonary Rehab is provided at no cost to the patient.  Checklist:  1. Pt has smart device  ie smartphone and/or ipad for downloading an app  Yes 2. Reliable internet/wifi service    Yes 3. Understands how to use their smartphone and navigate within an app.  Yes   Pt verbalized understanding and is in agreement.    Graylon Good, RN BSN  08/07/2019 11:06 AM

## 2019-08-08 ENCOUNTER — Inpatient Hospital Stay (HOSPITAL_COMMUNITY): Payer: 59

## 2019-08-08 DIAGNOSIS — I200 Unstable angina: Secondary | ICD-10-CM

## 2019-08-08 DIAGNOSIS — I10 Essential (primary) hypertension: Secondary | ICD-10-CM

## 2019-08-08 DIAGNOSIS — I2510 Atherosclerotic heart disease of native coronary artery without angina pectoris: Secondary | ICD-10-CM

## 2019-08-08 DIAGNOSIS — I214 Non-ST elevation (NSTEMI) myocardial infarction: Secondary | ICD-10-CM

## 2019-08-08 DIAGNOSIS — E785 Hyperlipidemia, unspecified: Secondary | ICD-10-CM

## 2019-08-08 DIAGNOSIS — I255 Ischemic cardiomyopathy: Secondary | ICD-10-CM

## 2019-08-08 LAB — CBC
HCT: 39.8 % (ref 39.0–52.0)
Hemoglobin: 13.2 g/dL (ref 13.0–17.0)
MCH: 30.6 pg (ref 26.0–34.0)
MCHC: 33.2 g/dL (ref 30.0–36.0)
MCV: 92.3 fL (ref 80.0–100.0)
Platelets: 217 10*3/uL (ref 150–400)
RBC: 4.31 MIL/uL (ref 4.22–5.81)
RDW: 13.1 % (ref 11.5–15.5)
WBC: 8 10*3/uL (ref 4.0–10.5)
nRBC: 0 % (ref 0.0–0.2)

## 2019-08-08 LAB — ECHOCARDIOGRAM LIMITED
Height: 71 in
Weight: 2758.4 oz

## 2019-08-08 LAB — MRSA PCR SCREENING: MRSA by PCR: NEGATIVE

## 2019-08-08 MED ORDER — CARVEDILOL 6.25 MG PO TABS: 6.2500 mg | ORAL_TABLET | Freq: Two times a day (BID) | ORAL | 6 refills | Status: DC

## 2019-08-08 MED ORDER — ASPIRIN 81 MG PO CHEW: 81.0000 mg | CHEWABLE_TABLET | Freq: Every day | ORAL | 3 refills | Status: DC

## 2019-08-08 MED ORDER — NITROGLYCERIN 0.4 MG SL SUBL: 0.4000 mg | SUBLINGUAL_TABLET | SUBLINGUAL | 1 refills | Status: DC | PRN

## 2019-08-08 MED ORDER — ATORVASTATIN CALCIUM 80 MG PO TABS: 80.0000 mg | ORAL_TABLET | Freq: Every day | ORAL | 6 refills | Status: DC

## 2019-08-08 MED ORDER — SACUBITRIL-VALSARTAN 24-26 MG PO TABS
1.0000 | ORAL_TABLET | Freq: Two times a day (BID) | ORAL | Status: DC
  Administered 2019-08-08: 11:00:00 1 via ORAL
  Filled 2019-08-08 (×2): qty 1

## 2019-08-08 MED ORDER — PERFLUTREN LIPID MICROSPHERE
1.0000 mL | INTRAVENOUS | Status: AC | PRN
  Administered 2019-08-08: 2 mL via INTRAVENOUS
  Filled 2019-08-08: qty 10

## 2019-08-08 MED ORDER — SACUBITRIL-VALSARTAN 24-26 MG PO TABS: 1.0000 | ORAL_TABLET | Freq: Two times a day (BID) | ORAL | 6 refills | Status: DC

## 2019-08-08 MED ORDER — TICAGRELOR 90 MG PO TABS: 90.0000 mg | ORAL_TABLET | Freq: Two times a day (BID) | ORAL | 3 refills | Status: DC

## 2019-08-08 MED FILL — ASPIRIN LOW DOSE 81 MG CHEW: 81 | 30 days supply | Qty: 30 | Fill #0

## 2019-08-08 MED FILL — NITROGLYCERIN 0.4 MG TAB SL: 0.4 | 8 days supply | Qty: 25 | Fill #0

## 2019-08-08 MED FILL — ENTRESTO 24 MG-26 MG TABLET: 24-26 | 30 days supply | Qty: 60 | Fill #0

## 2019-08-08 MED FILL — ATORVASTATIN CALCIUM 80 MG: 80 | 30 days supply | Qty: 30 | Fill #0

## 2019-08-08 MED FILL — CARVEDILOL 6.25 MG TABLET: 6.25 | 30 days supply | Qty: 60 | Fill #0

## 2019-08-08 MED FILL — BRILINTA 90 MG TABLET: 90 | 30 days supply | Qty: 60 | Fill #0

## 2019-08-08 NOTE — Telephone Encounter (Signed)
08/05/2019 - present (3 days) Jeff Barnes

## 2019-08-08 NOTE — Progress Notes (Signed)
  Echocardiogram 2D Echocardiogram has been performed.  Jeff Barnes A Jeff Barnes 08/08/2019, 10:11 AM

## 2019-08-08 NOTE — Discharge Summary (Addendum)
Discharge Summary    Patient ID: Jeff Barnes MRN: [id number]; DOB: 1971-08-22  Admit date: 08/05/2019 Discharge date: 08/08/2019  Primary Care Provider: Carol Ada, MD  Primary Cardiologist: Elouise Munroe, MD  Primary Electrophysiologist:  None   Discharge Diagnoses    Principal Problem:   NSTEMI (non-ST elevated myocardial infarction) [facility]) Active Problems:   Hyperlipidemia   Hypertension   Ischemic cardiomyopathy   Unstable angina ([place])   CAD (coronary artery disease) s/p DES x2 mid LAD/prox LAD 08/07/19   Diagnostic Studies/Procedures    Cath:  Ost LAD to Prox LAD lesion is 100% stenosed.  Post intervention, there is a 0% residual stenosis.  Mid LAD lesion is 50% stenosed.  A stent was successfully placed.  Recent acute coronary syndrome secondary to total ostial occlusion of a large LAD.  Normal large ramus intermediate, dominant left circumflex and nondominant RCA. There is faint distal LAD collateralization from the ramus and left circumflex injection.  Mild to moderate LV dysfunction with mid distal anterolateral hypocontractility and estimated EF at approximately 40%. LVEDP is 11 mm.  Difficult but successful percutaneous coronary intervention with ultimate insertion of a 3.0 x 18 mm Resolute Onyx DES stent postdilated to 3.25 mm with the 100% ostial LAD occlusion being reduced to 0% and TIMI 0 flow being improved to TIMI-3 flow. Once initially dilated with PTCA there was evidence for ulcerated plaque with thrombus and Aggrastat bolus plus infusion was administered. Once the LAD was open, there appears to be a smooth 50% narrowing in the mid LAD with a component of systolic muscle bridging.  RECOMMENDATION: DAPT for minimum of 1 year but possibly longer due to the location of the stent. Initiation of aggressive lipid-lowering therapy with target LDL less than 70. A 2D echo Doppler study will be performed for improved assessment of LV  function. Initiate carvedilol post MI and initiate ACE-I orARB or possibly Entresto depending upon LV function.   Coronary Diagrams  Diagnostic Dominance: Left  Intervention    Echo 08/07/19  1. Left ventricular ejection fraction, by visual estimation, is 40 to 45%. The left ventricle has mild to moderately decreased function. There is mildly increased left ventricular hypertrophy.  2. Mid and apical anterior wall, mid and apical anterior septum, and apex are abnormal.  3. Given apical akinesis would consider repeat limited echo with contrast to rule out LV thrombus  4. Left ventricular diastolic parameters are consistent with Grade I diastolic dysfunction (impaired relaxation).  5. Global right ventricle has normal systolic function.The right ventricular size is normal. No increase in right ventricular wall thickness.  6. Left atrial size was normal.  7. Right atrial size was normal.  8. The mitral valve is normal in structure. Mild mitral valve regurgitation.  9. The tricuspid valve is normal in structure. Tricuspid valve regurgitation is trivial. 10. The aortic valve is tricuspid. Aortic valve regurgitation is not visualized. No evidence of aortic valve sclerosis or stenosis. 11. The pulmonic valve was not well visualized. Pulmonic valve regurgitation is trivial. 12. TR signal is inadequate for assessing pulmonary artery systolic pressure. 13. The inferior vena cava is normal in size with greater than 50% respiratory variability, suggesting right atrial pressure of 3 mmHg.   Echo limited 08/08/19  1. Left ventricular ejection fraction, by visual estimation, is 30 to 35%. The left ventricle has normal function. There is no left ventricular hypertrophy.  2. There is akinesis of the mid inferoseptal and anterosepatal, apical septal and apical anterior walls.  3. Global right ventricle has normal systolic function.The right ventricular size is normal. No increase in right ventricular  wall thickness.  4. Left atrial size was normal.  5. Right atrial size was normal.  6. The mitral valve is normal in structure. No evidence of mitral valve regurgitation. No evidence of mitral stenosis.  7. The tricuspid valve is normal in structure. Tricuspid valve regurgitation is not demonstrated.  8. The aortic valve is normal in structure. Aortic valve regurgitation is not visualized. No evidence of aortic valve sclerosis or stenosis.  9. The pulmonic valve was normal in structure. Pulmonic valve regurgitation is not visualized. 10. The inferior vena cava is normal in size with greater than 50% respiratory variability, suggesting right atrial pressure of 3 mmHg.  _____________   History of Present Illness     Jeff Barnes is a 48 y.o. male with no prior pmh who presented to [facility] from an urgent care on 08/05/19 for 2 weeks of chest pain. His chest pain began while he was hiking the week prior. It was a centralized chest pressure with radiation to bilateral arms with associated shortness of breath, diaphoresis, and diarrhea. Pain was worse with exertion and improved with rest. He denied history of hypertension, hyperlipidemia, diabetes, or tobacco use. His family history positive for MI in his father in his 35s. The patient first went to an urgent care in which a EKG was performed with concern for ST elevation in the anterior leads with TWI and depression in lead III. EKG was read by Dr. Ellyn Hack who felt this was not an acute STEMI, but needed further ischemic work-up.   Hospital Course     Consultants: None  Upon arrival to the ED HS troponin was markedly elevated at 2403. Creatinine was stable at 0.96. All other labs were otherwise normal. He was started on IV heparin and made NPO for cardiac catheterization the next day. The following day he was taken back to the cath lab and underwent left heart cath showing ostial to proximal lesion 100% stenosed and a mid LAD 50% stenosed. It also  showed mild to mod LV dysfunction EF 40% and LVEDP 11 mmHg. He was treated with successful PCI, DES to prox LAD and DES to mid LAD. Post procedure recommendations for DAPT with Aspirin and Brilinta for 12 months. Also recommended initiation of high intensity statin and BB and a 2D echo doppler to assess improvement of LV function. Carvedilol and Atorvastatin were started. LDL came back at 121. 2D echo later that day showed EF 40-45% and possible LV thrombus with G1DD. Overnight NSVT was noted on telemetry and carvedilol was increased. A1C came back at 5.6. The patient remained chest pain free. A limited echo was obtained to evaluate possible LV thrombus which did not show evidence of this. EF on the limited echo was estimated at 30-35%. Delene Loll was initiated and pressures remained normotensive. The cath site, right radial, remained clean and dry with no signs of hematoma or bruit. Cardiac rehab worked with the patient and he was able to ambulated without chest pain or shortness of breath. Plan to discharge patient with prescriptions for DAPT with Aspirin and Plavix for 12 months. New prescriptions for carvedilol 6.25mg  BID, entresto 24-46 mg BID and Atorvastatin 80 mg daily also sent in. Patient will need follow-up labs in 6-8 weeks for lipid management. TOC visit was made for hospital follow up.   The patient was evaluated by Dr. Margaretann Loveless on 08/08/19 and felt to be  stable for discharge.   GEN:No acute distress.   Neck:No JVD Cardiac:RRR, no murmurs, rubs, or gallops.  Respiratory:Clear to auscultation bilaterally. NX:[id number], nontender, non-distended  MS:No edema; No deformity; right radial cath site clean and dry; no bruit or hematoma Neuro:Nonfocal  Psych: Normal affect    Did the patient have an acute coronary syndrome (MI, NSTEMI, STEMI, etc) this admission?:  Yes                               AHA/ACC Clinical Performance & Quality Measures: 1. Aspirin prescribed? - Yes 2. ADP Receptor  Inhibitor (Plavix/Clopidogrel, Brilinta/Ticagrelor or Effient/Prasugrel) prescribed (includes medically managed patients)? - Yes 3. Beta Blocker prescribed? - Yes 4. High Intensity Statin (Lipitor 40-80mg  or Crestor 20-40mg ) prescribed? - Yes 5. EF assessed during THIS hospitalization? - Yes 6. For EF <40%, was ACEI/ARB prescribed? -Not Applicable 7. For EF <40%, Aldosterone Antagonist (Spironolactone or Eplerenone) prescribed? - Not Applicable (EF >/= [id number]) 8. Cardiac Rehab Phase II ordered (Included Medically managed Patients)? - Yes   _____________  Discharge Vitals Blood pressure 124/88, pulse 88, temperature 98.6 F (37 C), temperature source Oral, resp. rate 16, height 5\' 11"  (1.803 m), weight 78.2 kg, SpO2 100 %.  Filed Weights   08/05/19 1631 08/07/19 0500  Weight: 79.4 kg 78.2 kg    Labs & Radiologic Studies    CBC Recent Labs    08/07/19 0209 08/08/19 0223  WBC 7.8 8.0  HGB 12.7* 13.2  HCT 38.9* 39.8  MCV 93.3 92.3  PLT 217 [id number]   Basic Metabolic Panel Recent Labs    08/06/19 0407 08/06/19 1131 08/07/19 0209  NA 142  --  139  K 3.9  --  3.7  CL 107  --  107  CO2 20*  --  24  GLUCOSE 93  --  93  BUN 12  --  7  CREATININE 0.87 0.87 0.72  CALCIUM 9.1  --  8.5*   Liver Function Tests No results for input(s): AST, ALT, ALKPHOS, BILITOT, PROT, ALBUMIN in the last 72 hours. No results for input(s): LIPASE, AMYLASE in the last 72 hours. High Sensitivity Troponin:   Recent Labs  Lab 08/05/19 1613 08/05/19 1807 08/06/19 1131 08/06/19 1320  TROPONINIHS 2,403* 2,143* 6,442* 6,712*    BNP Invalid input(s): POCBNP D-Dimer No results for input(s): DDIMER in the last 72 hours. Hemoglobin A1C Recent Labs    08/05/19 1900  HGBA1C 5.6   Fasting Lipid Panel Recent Labs    08/06/19 0407  CHOL 178  HDL 39*  LDLCALC 121*  TRIG 90  CHOLHDL 4.6   Thyroid Function Tests No results for input(s): TSH, T4TOTAL, T3FREE, THYROIDAB in the last 72 hours.   Invalid input(s): FREET3 _____________  Dg Chest Port 1 View  Result Date: 08/05/2019 CLINICAL DATA:  Chest pain EXAM: PORTABLE CHEST 1 VIEW COMPARISON:  07/07/2014 FINDINGS: The heart size and mediastinal contours are within normal limits. Both lungs are clear. The visualized skeletal structures are unremarkable. IMPRESSION: No active disease. Electronically Signed   By: Donavan Foil M.D.   On: 08/05/2019 18:16   Disposition   Pt is being discharged home today in good condition.  Follow-up Plans & Appointments    Follow-up Information    Elouise Munroe, MD Follow up on 08/16/2019.   Specialties: Cardiology, Radiology Why: Please go to hospital follow-up December 11th at 8:00 AM Contact information: [place]  [place] [address] [phone]          Discharge Instructions    Amb Referral to Cardiac Rehabilitation   Complete by: As directed    Diagnosis:  Coronary Stents NSTEMI     After initial evaluation and assessments completed: Virtual Based Care may be provided alone or in conjunction with Phase 2 Cardiac Rehab based on patient barriers.: Yes      Discharge Medications   Allergies as of 08/08/2019   No Known Allergies     Medication List    TAKE these medications   ALLEGRA PO Take 1 tablet by mouth daily.   aspirin 81 MG chewable tablet Chew 1 tablet (81 mg total) by mouth daily. Start taking on: August 09, 2019   atorvastatin 80 MG tablet Commonly known as: LIPITOR Take 1 tablet (80 mg total) by mouth daily at 6 PM.   carvedilol 6.25 MG tablet Commonly known as: COREG Take 1 tablet (6.25 mg total) by mouth 2 (two) times daily with a meal.   NEXIUM PO Take 1 tablet by mouth daily.   nitroGLYCERIN 0.4 MG SL tablet Commonly known as: NITROSTAT Place 1 tablet (0.4 mg total) under the tongue every 5 (five) minutes x 3 doses as needed for chest pain.   sacubitril-valsartan 24-26 MG Commonly known as: ENTRESTO Take 1  tablet by mouth 2 (two) times daily.   ticagrelor 90 MG Tabs tablet Commonly known as: BRILINTA Take 1 tablet (90 mg total) by mouth 2 (two) times daily.          Outstanding Labs/Studies     Duration of Discharge Encounter   Greater than 30 minutes including physician time.  Signed, Ramar Nobrega Ninfa Meeker, PA-C 08/08/2019, 11:44 AM  ---------------------------------------------------------------------------------------------   History and all data above reviewed.  Patient examined.  I agree with the findings as above.  Jeff Barnes feels well today but is eager to go home.  He is inquiring about his restrictions.  We discussed stress management.  Constitutional: No acute distress Eyes: pupils equally round and reactive to light, sclera non-icteric, normal conjunctiva and lids ENMT: normal dentition, moist mucous membranes Cardiovascular: regular rhythm, normal rate, no murmurs. S1 and S2 normal. Radial pulses normal bilaterally. No jugular venous distention.  Respiratory: clear to auscultation bilaterally GI : normal bowel sounds, soft and nontender. No distention.   MSK: extremities warm, well perfused. No edema.  NEURO: grossly nonfocal exam, moves all extremities. PSYCH: alert and oriented x 3, normal mood and affect.   All available labs, radiology testing, previous records reviewed. Agree with documented assessment and plan of my colleague as stated above with the following additions or changes:  Principal Problem:   NSTEMI (non-ST elevated myocardial infarction) [facility]) Active Problems:   Hyperlipidemia   Hypertension   Ischemic cardiomyopathy   Unstable angina ([place])   CAD (coronary artery disease) s/p DES x2 mid LAD/prox LAD 08/07/19    Plan: NSTEMI-patient will be dismissed on aspirin 81 mg daily, ticagrelor 90 mg twice daily.  His insurance does not cover ticagrelor, so at our follow-up appointment we will discuss transition to Plavix after the first 30 days.   We will also dismissed on carvedilol status post MI as well as Entresto given that LV function is mild to moderately reduced at 40 to 45%.  We have initiated atorvastatin 80 mg daily.  Echocardiogram demonstrates mid and apical anterior wall, mid and apical anterior septum hypokinesis and apical akinesis.  Limited echo was performed to ensure no  apical thrombus was present I have personally reviewed these images.  I discussed with the patient that I would like to see him in follow-up next week to ensure that he is feeling well.  We will review how he is tolerating Entresto and medical therapy.  He felt well at time of discharge.  Time Spent Directly with Patient:  I have spent a total of 35 minutes with the patient reviewing hospital notes, telemetry, EKGs, labs and examining the patient as well as establishing an assessment and plan that was discussed personally with the patient.  > 50% of time was spent in direct patient care.  Length of Stay:  LOS: 2 days   Elouise Munroe, MD [facility] 3:01 PM  08/08/2019

## 2019-08-08 NOTE — Discharge Instructions (Signed)
Heart Attack A heart attack occurs when blood and oxygen supply to the heart is cut off. A heart attack causes damage to the heart that cannot be fixed. A heart attack is also called a myocardial infarction, or MI. If you think you are having a heart attack, do not wait to see if the symptoms will go away. Get medical help right away. What are the causes? This condition may be caused by:  A fatty substance (plaque) in the blood vessels (arteries). This can block the flow of blood to the heart.  A blood clot in the blood vessels that go to the heart. The blood clot blocks blood flow.  Low blood pressure.  An abnormal heartbeat.  Some diseases, such as problems in red blood cells (anemia)orproblems in breathing (respiratory failure).  Tightening (spasm) of a blood vessel that cuts off blood to the heart.  A tear in a blood vessel of the heart.  High blood pressure. What increases the risk? The following factors may make you more likely to develop this condition:  Aging. The older you are, the higher your risk.  Having a personal or family history of chest pain, heart attack, stroke, or narrowing of the arteries in the legs, arms, head, or stomach (peripheral artery disease).  Being male.  Smoking.  Not getting regular exercise.  Being overweight or obese.  Having high blood pressure.  Having high cholesterol.  Having diabetes.  Drinking too much alcohol.  Using illegal drugs, such as cocaine or methamphetamine. What are the signs or symptoms? Symptoms of this condition include:  Chest pain. It may feel like: ? Crushing or squeezing. ? Tightness, pressure, fullness, or heaviness.  Pain in the arm, neck, jaw, back, or upper body.  Shortness of breath.  Heartburn.  Upset stomach (indigestion).  Feeling like you may vomit (nauseous).  Cold sweats.  Feeling tired.  Sudden light-headedness. How is this treated? A heart attack must be treated as soon as  possible. Treatment may include:  Medicines to: ? Break up or dissolve blood clots. ? Thin blood and help prevent blood clots. ? Treat blood pressure. ? Improve blood flow to the heart. ? Reduce pain. ? Reduce cholesterol.  Procedures to widen a blocked artery and keep it open.  Open heart surgery.  Receiving oxygen.  Making your heart strong again (cardiac rehabilitation) through exercise, education, and counseling. Follow these instructions at home: Medicines  Take over-the-counter and prescription medicines only as told by your doctor. You may need to take medicine: ? To keep your blood from clotting too easily. ? To control blood pressure. ? To lower cholesterol. ? To control heart rhythms.  Do not take these medicines unless your doctor says it is okay: ? NSAIDs, such as ibuprofen. ? Supplements that have vitamin A, vitamin E, or both. ? Hormone replacement therapy that has estrogen with or without progestin. Lifestyle      Do not use any products that have nicotine or tobacco, such as cigarettes, e-cigarettes, and chewing tobacco. If you need help quitting, ask your doctor.  Avoid secondhand smoke.  Exercise regularly. Ask your doctor about a cardiac rehab program.  Eat heart-healthy foods. Your doctor will tell you what foods to eat.  Stay at a healthy weight.  Lower your stress level.  Do not use illegal drugs. Alcohol use  Do not drink alcohol if: ? Your doctor tells you not to drink. ? You are pregnant, may be pregnant, or are planning to become pregnant.  If you drink alcohol: ? Limit how much you use to:  0-1 drink a day for women.  0-2 drinks a day for men. ? Know how much alcohol is in your drink. In the [place] one drink equals one 12 oz bottle of beer (355 mL), one 5 oz glass of wine (148 mL), or one 1 oz glass of hard liquor (44 mL). General instructions  Work with your doctor to treat other problems you may have, such as diabetes or high  blood pressure.  Get screened for depression. Get treatment if needed.  Keep your vaccines up to date. Get the flu shot (influenza vaccine) every year.  Keep all follow-up visits as told by your doctor. This is important. Contact a doctor if:  You feel very sad.  You have trouble doing your daily activities. Get help right away if:  You have sudden, unexplained discomfort in your chest, arms, back, neck, jaw, or upper body.  You have shortness of breath.  You have sudden sweating or clammy skin.  You feel like you may vomit.  You vomit.  You feel tired or weak.  You get light-headed or dizzy.  You feel your heart beating fast.  You feel your heart skipping beats.  You have blood pressure that is higher than 180/120. These symptoms may be an emergency. Do not wait to see if the symptoms will go away. Get medical help right away. Call your local emergency services (911 in the [place] Do not drive yourself to the hospital. Summary  A heart attack occurs when blood and oxygen supply to the heart is cut off.  Do not take NSAIDs unless your doctor says it is okay.  Do not smoke. Avoid secondhand smoke.  Exercise regularly. Ask your doctor about a cardiac rehab program. This information is not intended to replace advice given to you by your health care provider. Make sure you discuss any questions you have with your health care provider. Document Released: 02/21/2012 Document Revised: 12/03/2018 Document Reviewed: 12/03/2018 Elsevier Patient Education  [employer].

## 2019-08-08 NOTE — Progress Notes (Signed)
CARDIAC REHAB PHASE I   PRE:  Rate/Rhythm: 103 ST  BP:  Supine:   Sitting: 126/89  Standing:    SaO2:   MODE:  Ambulation: 740 ft   POST:  Rate/Rhythm: 105 ST  BP:  Supine:   Sitting: 132/82  Standing:    SaO2: 98%RA 0822-0836 Pt walked 740 ft on RA and tolerated well. No CP. Hoping to go home.   Graylon Good, RN BSN  08/08/2019 8:32 AM

## 2019-08-12 NOTE — Telephone Encounter (Signed)
[id number] 

## 2019-08-13 NOTE — Telephone Encounter (Signed)
Follow up ° ° °Patient is returning your call. Please call. ° ° ° °

## 2019-08-13 NOTE — Telephone Encounter (Signed)
Lm to call back ./cy 

## 2019-08-13 NOTE — Telephone Encounter (Signed)
  Patient contacted regarding discharge from [facility] on 08/08/19.  Patient understands to follow up with provider DR Margaretann Loveless on 08/15/19 at 9:20 AM at [facility]. Patient understands discharge instructions? YES Patient understands medications and regiment? YES Patient understands to bring all medications to this visit? YES

## 2019-08-15 ENCOUNTER — Other Ambulatory Visit: Payer: Self-pay

## 2019-08-15 ENCOUNTER — Ambulatory Visit: Payer: 59 | Admitting: Internal Medicine

## 2019-08-15 ENCOUNTER — Telehealth: Payer: Self-pay | Admitting: *Deleted

## 2019-08-15 ENCOUNTER — Encounter: Payer: Self-pay | Admitting: Internal Medicine

## 2019-08-15 VITALS — BP 114/74 | HR 79 | Ht 71.0 in | Wt 176.4 lb

## 2019-08-15 DIAGNOSIS — I214 Non-ST elevation (NSTEMI) myocardial infarction: Secondary | ICD-10-CM | POA: Diagnosis not present

## 2019-08-15 DIAGNOSIS — E785 Hyperlipidemia, unspecified: Secondary | ICD-10-CM | POA: Diagnosis not present

## 2019-08-15 DIAGNOSIS — I2510 Atherosclerotic heart disease of native coronary artery without angina pectoris: Secondary | ICD-10-CM

## 2019-08-15 DIAGNOSIS — I255 Ischemic cardiomyopathy: Secondary | ICD-10-CM | POA: Diagnosis not present

## 2019-08-15 NOTE — Patient Instructions (Signed)
Medication Instructions:  NO CHANGES *If you need a refill on your cardiac medications before your next appointment, please call your pharmacy*  Lab Work: LIPID IN 3 MONTHS - MARCH 2021  If you have labs (blood work) drawn today and your tests are completely normal, you will receive your results only by: Marland Kitchen MyChart Message (if you have MyChart) OR . A paper copy in the mail If you have any lab test that is abnormal or we need to change your treatment, we will call you to review the results.  Testing/Procedures: WILL BE SCHEDULE AT [facility] Your physician has requested that you have an echocardiogram. Echocardiography is a painless test that uses sound waves to create images of your heart. It provides your doctor with information about the size and shape of your heart and how well your heart's chambers and valves are working. This procedure takes approximately one hour. There are no restrictions for this procedure.    Follow-Up: At [facility], you and your health needs are our priority.  As part of our continuing mission to provide you with exceptional heart care, we have created designated Provider Care Teams.  These Care Teams include your primary Cardiologist (physician) and Advanced Practice Providers (APPs -  Physician Assistants and Nurse Practitioners) who all work together to provide you with the care you need, when you need it.  Your next appointment:   3 month(s)  The format for your next appointment:   In Person  Provider:   Cherlynn Kaiser, MD  Other Instructions

## 2019-08-15 NOTE — Progress Notes (Signed)
Cardiology Office Note:    Date:  08/15/2019   ID:  Jeff Barnes, DOB [birth year], MRN [id number]  PCP:  Carol Ada, MD  Cardiologist:  Elouise Munroe, MD  Electrophysiologist:  None   Referring MD: Carol Ada, MD   Chief Complaint: f/u NSTEMI  History of Present Illness:    Jeff Barnes is a 48 y.o. male with a hx of GERD who presents for follow-up after hospitalization for NSTEMI.  Initially met the patient in hospital.  He presented to [facility] from an urgent care on August 05, 2019 for 2 weeks of chest pain, which he initially noticed while hiking.  Family history of MI with his father having an MI in his 3s.  Initial ECG on presentation to the hospital was concerning for ST elevations however was reviewed by the on-call interventional cardiologist Dr. Ellyn Hack who felt it did not represent an ST elevation MI but probable NSTEMI.  Troponin was 2400.  The patient proceeded to cath where he was found to have an ostial to proximal LAD lesion which was 100% stenosed in the mid LAD 50% stenosis, LV function was felt to be mildly reduced with an EF of 40%.  He received a DES to the proximal LAD and DES to the mid LAD.  He was initiated on aspirin and Brilinta for dual antiplatelet therapy for 12 months.  Echocardiogram obtained showed EF 40 to 45%.  Though the dismissal summary mentions possibility of LV thrombus, echocardiogram mentioned excluding thrombus, none was definitively seen.  Limited echo pursued with contrast and no LV thrombus was noted.  Patient is feeling quite well and is appreciative of care in the hospital.  He is tolerating medical therapy well at this time.  Post MI medical therapy includes: aspirin 81 mg daily, Brilinta 90 mg twice daily, atorvastatin 80 mg daily, carvedilol 6.25 mg twice daily.  Due to reduced ejection fraction Entresto 24-26 mg twice daily was initiated while in hospital.  He is tolerating it well.  The patient denies  chest pain, chest pressure, dyspnea at rest or with exertion, palpitations, PND, orthopnea, or leg swelling. Denies syncope or presyncope. Denies dizziness or lightheadedness. Denies snoring and has not been evaluated for sleep apnea.  Past Medical History:  Diagnosis Date  . Coronary artery disease   . GERD (gastroesophageal reflux disease)   . H/O scarlet fever     Past Surgical History:  Procedure Laterality Date  . BACK SURGERY    . CARDIAC CATHETERIZATION    . CORONARY STENT INTERVENTION  08/06/2019  . CORONARY STENT INTERVENTION N/A 08/06/2019   Procedure: CORONARY STENT INTERVENTION;  Surgeon: Troy Sine, MD;  Location: [facility];  Service: Cardiovascular;  Laterality: N/A;  . LEFT HEART CATH AND CORONARY ANGIOGRAPHY N/A 08/06/2019   Procedure: LEFT HEART CATH AND CORONARY ANGIOGRAPHY;  Surgeon: Troy Sine, MD;  Location: [facility];  Service: Cardiovascular;  Laterality: N/A;    Current Medications: No outpatient medications have been marked as taking for the 08/15/19 encounter (Office Visit) with Elouise Munroe, MD.     Allergies:   Patient has no known allergies.   Social History   Socioeconomic History  . Marital status: Single    Spouse name: Not on file  . Number of children: Not on file  . Years of education: Not on file  . Highest education level: Not on file  Occupational History  . Not on file  Tobacco Use  .  Smoking status: Never Smoker  . Smokeless tobacco: Never Used  Substance and Sexual Activity  . Alcohol use: Yes  . Drug use: Never  . Sexual activity: Not on file  Other Topics Concern  . Not on file  Social History Narrative  . Not on file   Social Determinants of Health   Financial Resource Strain:   . Difficulty of Paying Living Expenses: Not on file  Food Insecurity:   . Worried About Charity fundraiser in the Last Year: Not on file  . Ran Out of Food in the Last Year: Not on file  Transportation Needs:    . Lack of Transportation (Medical): Not on file  . Lack of Transportation (Non-Medical): Not on file  Physical Activity:   . Days of Exercise per Week: Not on file  . Minutes of Exercise per Session: Not on file  Stress:   . Feeling of Stress : Not on file  Social Connections:   . Frequency of Communication with Friends and Family: Not on file  . Frequency of Social Gatherings with Friends and Family: Not on file  . Attends Religious Services: Not on file  . Active Member of Clubs or Organizations: Not on file  . Attends Archivist Meetings: Not on file  . Marital Status: Not on file     Family History: The patient's family history includes CAD in his father; Cancer in his father.  ROS:   Please see the history of present illness.    All other systems reviewed and are negative.  EKGs/Labs/Other Studies Reviewed:    The following studies were reviewed today:  EKG: Normal sinus rhythm, rightward axis, anteroseptal infarct age indeterminate  Epworth Sleepiness Scale: N/A  Recent Labs: 08/07/2019: BUN 7; Creatinine, Ser 0.72; Potassium 3.7; Sodium 139 08/08/2019: Hemoglobin 13.2; Platelets 217  Recent Lipid Panel    Component Value Date/Time   CHOL 178 08/06/2019 0407   TRIG 90 08/06/2019 0407   HDL 39 (L) 08/06/2019 0407   CHOLHDL 4.6 08/06/2019 0407   VLDL 18 08/06/2019 0407   LDLCALC 121 (H) 08/06/2019 0407    Physical Exam:    VS:  BP 114/74   Pulse 79   Ht '5\' 11"'  (1.803 m)   Wt 176 lb 6.4 oz (80 kg)   SpO2 98%   BMI 24.60 kg/m     Wt Readings from Last 5 Encounters:  08/15/19 176 lb 6.4 oz (80 kg)  08/07/19 172 lb 6.4 oz (78.2 kg)     Constitutional: No acute distress Eyes: sclera non-icteric, normal conjunctiva and lids ENMT: normal dentition, moist mucous membranes Cardiovascular: regular rhythm, normal rate, no murmurs. S1 and S2 normal. Radial pulses normal bilaterally. No jugular venous distention.  Radial cath site is well-healing.  Respiratory: clear to auscultation bilaterally GI : normal bowel sounds, soft and nontender. No distention.   MSK: extremities warm, well perfused. No edema.  NEURO: grossly nonfocal exam, moves all extremities. PSYCH: alert and oriented x 3, normal mood and affect.   ASSESSMENT:    1. NSTEMI (non-ST elevated myocardial infarction) ([place])   2. Hyperlipidemia LDL goal <70   3. Ischemic cardiomyopathy   4. Coronary artery disease involving native coronary artery of native heart without angina pectoris    PLAN:    NSTEMI-Mr. Lerew is on appropriate therapy after NSTEMI status post PCI.  Continue dual antiplatelet therapy. I have instructed the patient that dual antiplatelet therapy should be taken for 1 year without interruption.  We have discussed the consequences of interrupted dual antiplatelet therapy and the risk for in-stent thrombosis.  The pharmacist in the hospital raise questions about insurance coverage of Brilinta.  After looking into this with our pharmacist colleagues, appropriate coverage options have been identified.  The patient will not need to switch to Plavix at this time, however we have discussed reloading and transition to Plavix if necessary prior to the 33-monthmark.  It may be reasonable to continue Brilinta for a longer period of time given the ostial LAD lesion, we will revisit this on subsequent visits.  Continue atorvastatin, beta-blocker, Entresto.  Ischemic cardiomyopathy with reduced systolic function-we have initiated Entresto in the hospital.  Patient's blood pressure is appropriate but may not tolerate further up titration at this time, we will continue this dose of Entresto currently and repeat an echocardiogram in 3 months to ensure we have appropriate impact of therapy.  Hyperlipidemia-patient's LDL is greater than goal of 70, he is on atorvastatin 80 mg daily.  We should recheck a lipid panel at the time of his next visit in March if not at goal, we should  discuss additional therapy is needed to help with LDL to goal.  TIME SPENT WITH PATIENT: 25 minutes of direct patient care. More than 50% of that time was spent on coordination of care and counseling regarding the above noted problems..Cherlynn Kaiser MD [place]  [facility]   Medication Adjustments/Labs and Tests Ordered: Current medicines are reviewed at length with the patient today.  Concerns regarding medicines are outlined above.  Orders Placed This Encounter  Procedures  . Lipid panel  . EKG 12-Lead  . ECHOCARDIOGRAM COMPLETE   No orders of the defined types were placed in this encounter.   Patient Instructions  Medication Instructions:  NO CHANGES *If you need a refill on your cardiac medications before your next appointment, please call your pharmacy*  Lab Work: LIPID IN 3 MONTHS - MARCH 2021  If you have labs (blood work) drawn today and your tests are completely normal, you will receive your results only by: .Marland KitchenMyChart Message (if you have MyChart) OR . A paper copy in the mail If you have any lab test that is abnormal or we need to change your treatment, we will call you to review the results.  Testing/Procedures: WILL BE SCHEDULE AT 1[place]Your physician has requested that you have an echocardiogram. Echocardiography is a painless test that uses sound waves to create images of your heart. It provides your doctor with information about the size and shape of your heart and how well your heart's chambers and valves are working. This procedure takes approximately one hour. There are no restrictions for this procedure.    Follow-Up: [facility] and your health needs are our priority.  As part of our continuing mission to provide you with exceptional heart care, we have created designated Provider Care Teams.  These Care Teams include your primary Cardiologist (physician) and Advanced Practice Providers (APPs -  Physician  Assistants and Nurse Practitioners) who all work together to provide you with the care you need, when you need it.  Your next appointment:   3 month(s)  The format for your next appointment:   In Person  Provider:   GCherlynn Kaiser MD  Other Instructions

## 2019-08-15 NOTE — Telephone Encounter (Signed)
LEft message to call back -   OFFICE CONTACTED  Jeff Barnes  Is available for  Pick up - at the cost of $24. 99-  Office can supply a  [employer] for  Cost of $5 if patient choose to use. Awaiting return call.

## 2019-08-15 NOTE — Telephone Encounter (Signed)
CALLED LEFT DETAILED MESSAGE  THAT MEDICATION CAN BE PURCHASED AT PHARMACY - IF PATIENT WANTS SAVING CARD IT IS AVAILABLE MAY PICK UP OR CAN GO ONLINE  OBTAIN A COPY .  MAY CALL BACK IF NEEDED

## 2019-08-16 ENCOUNTER — Ambulatory Visit: Payer: 59 | Admitting: Internal Medicine

## 2019-08-27 MED ORDER — CARVEDILOL 6.25 MG PO TABS: 6.2500 mg | ORAL_TABLET | Freq: Two times a day (BID) | ORAL | 3 refills | Status: DC

## 2019-08-27 MED ORDER — SACUBITRIL-VALSARTAN 24-26 MG PO TABS: 1.0000 | ORAL_TABLET | Freq: Two times a day (BID) | ORAL | 3 refills | Status: DC

## 2019-08-27 MED ORDER — ATORVASTATIN CALCIUM 80 MG PO TABS: 80.0000 mg | ORAL_TABLET | Freq: Every day | ORAL | 3 refills | Status: DC

## 2019-08-27 NOTE — Telephone Encounter (Signed)
Refilled medication and sent to [facility],

## 2019-09-02 ENCOUNTER — Telehealth: Payer: Self-pay | Admitting: *Deleted

## 2019-09-02 NOTE — Telephone Encounter (Signed)
Started  Prior authorization for  Praxair 24/26mg   180 tablets , 3 refills   called and spoke to  Representative  dx  Ischemic cardiomapthy I25.5 NSTEMI- I21.4  medication was approved.  called and verified with  Pharmacy  Sent a mychart message to patient.

## 2019-09-10 NOTE — Telephone Encounter (Signed)
Open error 

## 2019-09-11 ENCOUNTER — Telehealth (HOSPITAL_COMMUNITY): Payer: Self-pay

## 2019-09-11 ENCOUNTER — Telehealth (HOSPITAL_COMMUNITY): Payer: Self-pay | Admitting: *Deleted

## 2019-09-11 NOTE — Telephone Encounter (Signed)
Cardiac Rehab Medication Review by a Pharmacist  Does the patient  feel that his/her medications are working for him/her?  yes  Has the patient been experiencing any side effects to the medications prescribed?  no  Does the patient measure his/her own blood pressure?  yes   Does the patient have any problems obtaining medications due to transportation or finances?   no  Understanding of regimen: good Understanding of indications: good Potential of compliance: excellent    Pharmacist comments: none    Acey Lav, PharmD  PGY1 Acute Care Pharmacy Resident [phone] 09/11/2019 5:53 PM

## 2019-09-11 NOTE — Telephone Encounter (Signed)
Spoke with patient completed health history. Confirmed appointment for orientation tomorrow 09/11/18.Barnet Pall, RN,BSN 09/11/2019 9:16 AM

## 2019-09-12 ENCOUNTER — Other Ambulatory Visit: Payer: Self-pay

## 2019-09-12 ENCOUNTER — Encounter (HOSPITAL_COMMUNITY)
Admission: RE | Admit: 2019-09-12 | Discharge: 2019-09-12 | Disposition: A | Discharge status: Home / Self Care | Payer: 59 | Source: Ambulatory Visit | Attending: Internal Medicine | Admitting: Internal Medicine

## 2019-09-12 ENCOUNTER — Encounter (HOSPITAL_COMMUNITY): Payer: Self-pay

## 2019-09-12 VITALS — BP 108/80 | HR 83 | Temp 96.6°F | Ht 69.75 in | Wt 171.3 lb

## 2019-09-12 DIAGNOSIS — Z955 Presence of coronary angioplasty implant and graft: Secondary | ICD-10-CM

## 2019-09-12 DIAGNOSIS — I214 Non-ST elevation (NSTEMI) myocardial infarction: Secondary | ICD-10-CM | POA: Insufficient documentation

## 2019-09-12 NOTE — Progress Notes (Signed)
Pt is interested in participating in Virtual Cardiac and Pulmonary Rehab. Pt advised that Virtual Cardiac and Pulmonary Rehab is provided at no cost to the patient.  Checklist:  1. Pt has smart device  ie smartphone and/or ipad for downloading an app  Yes 2. Reliable internet/wifi service    Yes 3. Understands how to use their smartphone and navigate within an app.  Yes   Pt verbalized understanding and is in agreement.            Confirm Consent - In the setting of the current Covid19 crisis, you are scheduled for a phone visit with your Cardiac or Pulmonary team member.  Just as we do with many in-gym visits, in order for you to participate in this visit, we must obtain consent.  If you'd like, I can send this to your mychart (if signed up) or email for you to review.  Otherwise, I can obtain your verbal consent now.  By agreeing to a telephone visit, we'd like you to understand that the technology does not allow for your Cardiac or Pulmonary Rehab team member to perform a physical assessment, and thus may limit their ability to fully assess your ability to perform exercise programs. If your provider identifies any concerns that need to be evaluated in person, we will make arrangements to do so.  Finally, though the technology is pretty good, we cannot assure that it will always work on either your or our end and we cannot ensure that we have a secure connection.  Cardiac and Pulmonary Rehab Telehealth visits and "At Home" cardiac and pulmonary rehab are provided at no cost to you.        Are you willing to proceed?"        STAFF: Did the patient verbally acknowledge consent to telehealth visit? Document YES/NO here: Yes     Barnet Pall RN  Cardiac and Pulmonary Rehab Staff        Date 09/12/19    @ Time 1437

## 2019-09-12 NOTE — Progress Notes (Signed)
Cardiac Individual Treatment Plan  Patient Details  Name: Jeff Barnes MRN: [id number] Date of Birth: 07/15/1971 Referring Provider:     CARDIAC REHAB PHASE II ORIENTATION from 09/12/2019 in [place]  Referring Provider  Cherlynn Kaiser A MD      Initial Encounter Date:    CARDIAC REHAB PHASE II ORIENTATION from 09/12/2019 in [place]  Date  09/12/19      Visit Diagnosis: NSTEMI (non-ST elevated myocardial infarction) ([place]) 08/05/19  S/P drug eluting coronary stent placement 08/06/19  Patient's Home Medications on Admission:  Current Outpatient Medications:  .  aspirin 81 MG chewable tablet, Chew 1 tablet (81 mg total) by mouth daily., Disp: 90 tablet, Rfl: 3 .  atorvastatin (LIPITOR) 80 MG tablet, Take 1 tablet (80 mg total) by mouth daily at 6 PM., Disp: 90 tablet, Rfl: 3 .  carvedilol (COREG) 6.25 MG tablet, Take 1 tablet (6.25 mg total) by mouth 2 (two) times daily with a meal., Disp: 180 tablet, Rfl: 3 .  Esomeprazole Magnesium (NEXIUM PO), Take 1 tablet by mouth daily. , Disp: , Rfl:  .  Fexofenadine HCl (ALLEGRA PO), Take 1 tablet by mouth daily. , Disp: , Rfl:  .  nitroGLYCERIN (NITROSTAT) 0.4 MG SL tablet, Place 1 tablet (0.4 mg total) under the tongue every 5 (five) minutes x 3 doses as needed for chest pain., Disp: 25 tablet, Rfl: 1 .  sacubitril-valsartan (ENTRESTO) 24-26 MG, Take 1 tablet by mouth 2 (two) times daily., Disp: 180 tablet, Rfl: 3 .  ticagrelor (BRILINTA) 90 MG TABS tablet, Take 1 tablet (90 mg total) by mouth 2 (two) times daily., Disp: 180 tablet, Rfl: 3  Past Medical History: Past Medical History:  Diagnosis Date  . Coronary artery disease   . GERD (gastroesophageal reflux disease)   . H/O scarlet fever     Tobacco Use: Social History   Tobacco Use  Smoking Status Never Smoker  Smokeless Tobacco Never Used    Labs: Recent Review Scientist, physiological    Labs for ITP Cardiac  and Pulmonary Rehab Latest Ref Rng & Units 08/05/2019 08/06/2019   Cholestrol 0 - 200 mg/dL - 178   LDLCALC 0 - 99 mg/dL - 121(H)   HDL >40 mg/dL - 39(L)   Trlycerides <150 mg/dL - 90   Hemoglobin A1c 4.8 - 5.6 % 5.6 -      Capillary Blood Glucose: No results found for: GLUCAP   Exercise Target Goals: Exercise Program Goal: Individual exercise prescription set using results from initial 6 min walk test and THRR while considering  patient's activity barriers and safety.   Exercise Prescription Goal: Starting with aerobic activity 30 plus minutes a day, 3 days per week for initial exercise prescription. Provide home exercise prescription and guidelines that participant acknowledges understanding prior to discharge.  Activity Barriers & Risk Stratification: Activity Barriers & Cardiac Risk Stratification - 09/12/19 1423      Activity Barriers & Cardiac Risk Stratification   Activity Barriers  None    Cardiac Risk Stratification  High       6 Minute Walk: 6 Minute Walk    Row Name 09/12/19 1330         6 Minute Walk   Phase  Initial     Distance  1852 feet     Walk Time  6 minutes     # of Rest Breaks  0     MPH  3.51  METS  5.3     RPE  11     Perceived Dyspnea   0     VO2 Peak  18.5     Symptoms  No     Resting HR  83 bpm     Resting BP  108/80     Resting Oxygen Saturation   97 %     Exercise Oxygen Saturation  during 6 min walk  97 %     Max Ex. HR  108 bpm     Max Ex. BP  116/62     2 Minute Post BP  102/60        Oxygen Initial Assessment:   Oxygen Re-Evaluation:   Oxygen Discharge (Final Oxygen Re-Evaluation):   Initial Exercise Prescription: Initial Exercise Prescription - 09/12/19 1300      Date of Initial Exercise RX and Referring Provider   Date  09/12/19    Referring Provider  Elouise Munroe MD      Track   Minutes  45      Prescription Details   Frequency (times per week)  3x    Duration  Progress to 30 minutes of continuous  aerobic without signs/symptoms of physical distress      Intensity   THRR 40-80% of Max Heartrate  69-138    Ratings of Perceived Exertion  11-13    Perceived Dyspnea  0-4      Progression   Progression  Continue progressive overload as per policy without signs/symptoms or physical distress.      Resistance Training   Training Prescription  No       Perform Capillary Blood Glucose checks as needed.  Exercise Prescription Changes:   Exercise Comments:   Exercise Goals and Review: Exercise Goals    Row Name 09/12/19 1339             Exercise Goals   Increase Physical Activity  Yes       Intervention  Provide advice, education, support and counseling about physical activity/exercise needs.;Develop an individualized exercise prescription for aerobic and resistive training based on initial evaluation findings, risk stratification, comorbidities and participant's personal goals.       Expected Outcomes  Short Term: Attend rehab on a regular basis to increase amount of physical activity.;Long Term: Add in home exercise to make exercise part of routine and to increase amount of physical activity.;Long Term: Exercising regularly at least 3-5 days a week.       Increase Strength and Stamina  Yes       Intervention  Provide advice, education, support and counseling about physical activity/exercise needs.;Develop an individualized exercise prescription for aerobic and resistive training based on initial evaluation findings, risk stratification, comorbidities and participant's personal goals.       Expected Outcomes  Short Term: Increase workloads from initial exercise prescription for resistance, speed, and METs.;Short Term: Perform resistance training exercises routinely during rehab and add in resistance training at home;Long Term: Improve cardiorespiratory fitness, muscular endurance and strength as measured by increased METs and functional capacity (6MWT)       Able to understand and  use rate of perceived exertion (RPE) scale  Yes       Intervention  Provide education and explanation on how to use RPE scale       Expected Outcomes  Short Term: Able to use RPE daily in rehab to express subjective intensity level;Long Term:  Able to use RPE to guide intensity level when exercising independently  Knowledge and understanding of Target Heart Rate Range (THRR)  Yes       Intervention  Provide education and explanation of THRR including how the numbers were predicted and where they are located for reference       Expected Outcomes  Short Term: Able to state/look up THRR;Long Term: Able to use THRR to govern intensity when exercising independently;Short Term: Able to use daily as guideline for intensity in rehab       Able to check pulse independently  Yes       Intervention  Provide education and demonstration on how to check pulse in carotid and radial arteries.;Review the importance of being able to check your own pulse for safety during independent exercise       Expected Outcomes  Short Term: Able to explain why pulse checking is important during independent exercise;Long Term: Able to check pulse independently and accurately       Understanding of Exercise Prescription  Yes       Intervention  Provide education, explanation, and written materials on patient's individual exercise prescription       Expected Outcomes  Short Term: Able to explain program exercise prescription;Long Term: Able to explain home exercise prescription to exercise independently          Exercise Goals Re-Evaluation :    Discharge Exercise Prescription (Final Exercise Prescription Changes):   Nutrition:  Target Goals: Understanding of nutrition guidelines, daily intake of sodium 1500mg , cholesterol 200mg , calories 30% from fat and 7% or less from saturated fats, daily to have 5 or more servings of fruits and vegetables.  Biometrics: Pre Biometrics - 09/12/19 1331      Pre Biometrics    Height  5' 9.75" (1.772 m)    Weight  77.7 kg    Waist Circumference  35 inches    Hip Circumference  36 inches    Waist to Hip Ratio  0.97 %    BMI (Calculated)  24.75    Triceps Skinfold  12 mm    % Body Fat  22.3 %    Grip Strength  44 kg    Flexibility  17 in    Single Leg Stand  30 seconds        Nutrition Therapy Plan and Nutrition Goals:   Nutrition Assessments:   Nutrition Goals Re-Evaluation:   Nutrition Goals Discharge (Final Nutrition Goals Re-Evaluation):   Psychosocial: Target Goals: Acknowledge presence or absence of significant depression and/or stress, maximize coping skills, provide positive support system. Participant is able to verbalize types and ability to use techniques and skills needed for reducing stress and depression.  Initial Review & Psychosocial Screening: Initial Psych Review & Screening - 09/12/19 1139      Initial Review   Current issues with  None Identified      Family Dynamics   Good Support System?  Yes   [name] has his girlfriend and two children for support     Barriers   Psychosocial barriers to participate in program  The patient should benefit from training in stress management and relaxation.      Screening Interventions   Interventions  Encouraged to exercise    Expected Outcomes  Short Term goal: Identification and review with participant of any Quality of Life or Depression concerns found by scoring the questionnaire.;Long Term Goal: Stressors or current issues are controlled or eliminated.       Quality of Life Scores: Quality of Life - 09/12/19 1346  Quality of Life   Select  Quality of Life      Quality of Life Scores   Health/Function Pre  18.07 %    Socioeconomic Pre  17.44 %    Psych/Spiritual Pre  18.93 %    Family Pre  21.5 %    GLOBAL Pre  18.59 %      Scores of 19 and below usually indicate a poorer quality of life in these areas.  A difference of  2-3 points is a clinically meaningful  difference.  A difference of 2-3 points in the total score of the Quality of Life Index has been associated with significant improvement in overall quality of life, self-image, physical symptoms, and general health in studies assessing change in quality of life.  PHQ-9: Recent Review Flowsheet Data    Depression screen [facility] 2/9 09/12/2019   Decreased Interest 0   Down, Depressed, Hopeless 0   PHQ - 2 Score 0     Interpretation of Total Score  Total Score Depression Severity:  1-4 = Minimal depression, 5-9 = Mild depression, 10-14 = Moderate depression, 15-19 = Moderately severe depression, 20-27 = Severe depression   Psychosocial Evaluation and Intervention:   Psychosocial Re-Evaluation:   Psychosocial Discharge (Final Psychosocial Re-Evaluation):   Vocational Rehabilitation: Provide vocational rehab assistance to qualifying candidates.   Vocational Rehab Evaluation & Intervention: Vocational Rehab - 09/12/19 1141      Initial Vocational Rehab Evaluation & Intervention   Assessment shows need for Vocational Rehabilitation  No       Education: Education Goals: Education classes will be provided on a weekly basis, covering required topics. Participant will state understanding/return demonstration of topics presented.  Learning Barriers/Preferences: Learning Barriers/Preferences - 09/12/19 1347      Learning Barriers/Preferences   Learning Barriers  None    Learning Preferences  Individual Instruction;Written Material;Verbal Instruction;Skilled Demonstration       Education Topics: Hypertension, Hypertension Reduction -Define heart disease and high blood pressure. Discus how high blood pressure affects the body and ways to reduce high blood pressure.   Exercise and Your Heart -Discuss why it is important to exercise, the FITT principles of exercise, normal and abnormal responses to exercise, and how to exercise safely.   Angina -Discuss definition of angina, causes  of angina, treatment of angina, and how to decrease risk of having angina.   Cardiac Medications -Review what the following cardiac medications are used for, how they affect the body, and side effects that may occur when taking the medications.  Medications include Aspirin, Beta blockers, calcium channel blockers, ACE Inhibitors, angiotensin receptor blockers, diuretics, digoxin, and antihyperlipidemics.   Congestive Heart Failure -Discuss the definition of CHF, how to live with CHF, the signs and symptoms of CHF, and how keep track of weight and sodium intake.   Heart Disease and Intimacy -Discus the effect sexual activity has on the heart, how changes occur during intimacy as we age, and safety during sexual activity.   Smoking Cessation / COPD -Discuss different methods to quit smoking, the health benefits of quitting smoking, and the definition of COPD.   Nutrition I: Fats -Discuss the types of cholesterol, what cholesterol does to the heart, and how cholesterol levels can be controlled.   Nutrition II: Labels -Discuss the different components of food labels and how to read food label   Heart Parts/Heart Disease and PAD -Discuss the anatomy of the heart, the pathway of blood circulation through the heart, and these are affected by heart  disease.   Stress I: Signs and Symptoms -Discuss the causes of stress, how stress may lead to anxiety and depression, and ways to limit stress.   Stress II: Relaxation -Discuss different types of relaxation techniques to limit stress.   Warning Signs of Stroke / TIA -Discuss definition of a stroke, what the signs and symptoms are of a stroke, and how to identify when someone is having stroke.   Knowledge Questionnaire Score: Knowledge Questionnaire Score - 09/12/19 1424      Knowledge Questionnaire Score   Pre Score  22/24       Core Components/Risk Factors/Patient Goals at Admission: Personal Goals and Risk Factors at Admission  - 09/12/19 1347      Core Components/Risk Factors/Patient Goals on Admission   Intervention  Provide education and support for participant on nutrition & aerobic/resistive exercise along with prescribed medications to achieve LDL 70mg , HDL >40mg .    Expected Outcomes  Short Term: Participant states understanding of desired cholesterol values and is compliant with medications prescribed. Participant is following exercise prescription and nutrition guidelines.;Long Term: Cholesterol controlled with medications as prescribed, with individualized exercise RX and with personalized nutrition plan. Value goals: LDL < 70mg , HDL > 40 mg.       Core Components/Risk Factors/Patient Goals Review:    Core Components/Risk Factors/Patient Goals at Discharge (Final Review):    ITP Comments: ITP Comments    Row Name 09/12/19 1138           ITP Comments  Dr Fransico Him MD, Medical Director          Comments:  attended orientation on 09/12/2019 to review rules and guidelines for program.  Completed 6 minute walk test, Intitial ITP, and exercise prescription.  VSS. Telemetry-Sinus Rhythm.  Asymptomatic. Safety measures and social distancing in place per CDC guidelines. Patient downloaded the virtual cardiac rehab better heart APP and set up an appointment to meet with the RD.Barnet Pall, RN,BSN 09/12/2019 2:36 PM

## 2019-09-12 NOTE — Progress Notes (Signed)
I have reviewed a Home Exercise Prescription with Jeff Barnes . Jeff Barnes is currently exercising at home. The patient was advised to continue to walk 5-7 days a week for 30-45 minutes.  Jeff Barnes and I discussed how to progress their exercise prescription.  Pt will start to incorporate hills in his hike in about two weeks for 1 day to see how it is tolerated. If it is tolerated well, pt will begin to walk trails a little more challenging. The patient stated that their goals were build stamina back up for jogging/running The patient stated that they understand the exercise prescription. We reviewed exercise guidelines, target heart rate during exercise, RPE Scale, weather conditions, NTG use, endpoints for exercise, warmup and cool down. Patient is encouraged to come to me with any questions. I will continue to follow up with the patient to assist them with progression and safety.    Carma Lair MS, ACSM CEP 3:08 PM 09/12/2019

## 2019-09-18 ENCOUNTER — Inpatient Hospital Stay (HOSPITAL_COMMUNITY)
Admission: RE | Admit: 2019-09-18 | Discharge: 2019-09-18 | Disposition: A | Discharge status: Home / Self Care | Payer: 59 | Source: Ambulatory Visit

## 2019-09-18 ENCOUNTER — Other Ambulatory Visit: Payer: Self-pay

## 2019-09-18 NOTE — Progress Notes (Signed)
Jeff Barnes 49 y.o. male Nutrition Note - VIRTUAL CARDIAC REHAB Spoke with pt. Nutrition Plan and Nutrition Survey goals reviewed with pt. Pt is following a Heart Healthy diet.  Pt with dx of CHF. Per discussion, pt does not use canned/convenience foods often. Pt does not add salt to food. Pt does not eat out frequently.   Reviewed diet recall.  [name] has made appropriate diet changes for heart health. He is eating very limited types of foods. Discussed expanding food groups. Recommended <2 alcoholic beverages/day.  Reviewed beverage size. [name] is experiencing a lot of stress around his diet and craving foods he feels are restricted. Recommended finding balance between stress and strict diet. Discussed incorporating cooking some of his favorite foods at home to limit saturated fats and sodium while also feeling satisfied.   Education provided: heart healthy/mediterranean, low sodium, label reading.  Provided handouts and reinforcement via email.   Pt expressed understanding of the information reviewed.  Lab Results  Component Value Date   HGBA1C 5.6 08/05/2019    Wt Readings from Last 3 Encounters:  09/12/19 171 lb 4.8 oz (77.7 kg)  08/15/19 176 lb 6.4 oz (80 kg)  08/07/19 172 lb 6.4 oz (78.2 kg)    Nutrition Diagnosis Food-and nutrition-related knowledge deficit related to lack of exposure to information as related to diagnosis of: ? CVD ?   Nutrition Intervention ? Pt's individual nutrition plan reviewed with pt. ? Benefits of adopting Heart Healthy diet discussed  ? Continue client-centered nutrition education by RD, as part of interdisciplinary care.  Goal(s)  ? Pt to build a healthy plate including vegetables, fruits, whole grains, and low-fat dairy products in a heart healthy meal plan. ? Pt to minimize stress around diet.   Plan:    Will provide client-centered nutrition education as part of interdisciplinary care  Monitor and evaluate progress toward  nutrition goal with team.   Michaele Offer, MS, RDN, LDN

## 2019-09-25 ENCOUNTER — Encounter (HOSPITAL_COMMUNITY)
Admission: RE | Admit: 2019-09-25 | Discharge: 2019-09-25 | Disposition: A | Discharge status: Home / Self Care | Payer: Self-pay | Source: Ambulatory Visit | Attending: Internal Medicine | Admitting: Internal Medicine

## 2019-09-25 ENCOUNTER — Telehealth: Payer: Self-pay | Admitting: Internal Medicine

## 2019-09-25 ENCOUNTER — Other Ambulatory Visit: Payer: Self-pay

## 2019-09-25 DIAGNOSIS — I214 Non-ST elevation (NSTEMI) myocardial infarction: Secondary | ICD-10-CM | POA: Insufficient documentation

## 2019-09-25 DIAGNOSIS — Z955 Presence of coronary angioplasty implant and graft: Secondary | ICD-10-CM | POA: Insufficient documentation

## 2019-09-25 NOTE — Progress Notes (Addendum)
Virtual Cardiac Rehab Telephone Note:  Successful telephone call to patient to continue follow up and assessment of patients "chest discomfort" during exercise today. Per patient, his chest discomfort today was less of a "stabbing pain" and more of an "ache" that occurred during sit-ups and lasted approximately 3 min. Rated discomfort 2/10 The "ache" resolved on its own once resting. Patient denies correlation between meals, exercise, stress, and  chest discomfort. Per patient, "ache" and "stabbing" pain "seems to occur early morning or late evening".  Jeff Barnes re-educated on the appropriate use of SL nitroglycerine.   Notified cardiology office and detailed message left with [facility] requesting call from office to patient for further assessment and recommendations.  Plan: Will continue to monitor patient closely via [facility] app/telephone encounters  Jeff Barnes, BSN

## 2019-09-25 NOTE — Telephone Encounter (Signed)
Attempted to call the [facility].Marland Kitchen but LM for her to call our office back when she is back in the morning. Dr. Margaretann Loveless will be here 09/26/19.   Tried to call the pt but No answer at his home number to assess his symptoms and possible appt.

## 2019-09-25 NOTE — Telephone Encounter (Signed)
Pt had chest discomfort 2/10 pain scale lasting 3 minutes during his virtual cardiac rehab today. He said the CP happens both resting and with exertion, and it was not GERD or food related. The  Truddie Crumble would like to speak with Dr. Margaretann Loveless or her Nurse to discuss this patient. She is concerned that he is not providing accurate information to both Dr. Margaretann Loveless and the Rehab facility.   Truddie Crumble was getting ready to leave for the day. If Dr. Margaretann Loveless would like to touch base with her it would have to be tomorrow

## 2019-09-25 NOTE — Progress Notes (Signed)
Virtual Cardiac Rehab Note:  Successful telephone encounter to Mr. Jeff Barnes to follow up on symptoms with exercise documented in the [facility] Virtual Cardiac Rehab App. [name] documented "chest discomfort" during his exercise session today. [name] is s/p NSTEMI 08/05/19 with DES to LAD 08/06/19. Unfortunately [name] can not talk in depth as he is "late logging into a meeting". He does state he is not having any chest discomfort currently, that this is not the first time he has had discomfort since discharged from hospital, and his cardiologist is aware, however upon review of last  Cardiology OV note from 08/15/19, there is not mention of intermittent chest discomfort. Dr. Margaretann Loveless specifically documents "The patient denies chest pain, chest pressure, dyspnea at rest or with exertion, palpitations, PND, orthopnea, or leg swelling. Denies syncope or presyncope. Denies dizziness or lightheadedness. Denies snoring and has not been evaluated for sleep apnea."   [name] is open to a follow up call today after 4 pm to discuss symptoms more in depth. Will also address elevated HR of 153 during today's exercise which is > max target of 138.  Plan: Will call patient back today after 4. Will also route this note to Dr. Wonda Cheng E. Laray Anger, BSN

## 2019-10-01 NOTE — Telephone Encounter (Signed)
Called Jeff Barnes to discuss patient, she stated that she wanted to just check to make sure patient was okay as he states he has chest pain with the activities- I tried to contact patient, unable to reach him.  Will try again and see if symptoms are better.

## 2019-10-02 NOTE — Progress Notes (Signed)
Cardiology Office Note:    Date:  10/03/2019   ID:  JACINTO Barnes, DOB June 05, 1971, MRN [id number]  PCP:  Carol Ada, MD  Cardiologist:  Elouise Munroe, MD  Electrophysiologist:  None   Referring MD: Carol Ada, MD   Chief Complaint: Chest pain  History of Present Illness:    Jeff Barnes is a 49 y.o. male with a hx of GERD who presents for follow-up after hospitalization for NSTEMI, now with chest pain during cardiac rehab.   I initially met the patient in hospital.  He presented to [facility] from an urgent care on August 05, 2019 for 2 weeks of chest pain, which he initially noticed while hiking.  Family history of MI with his father having an MI in his 21s.  Initial ECG on presentation to the hospital was concerning for ST elevations however was reviewed by the on-call interventional cardiologist Dr. Ellyn Hack who felt it did not represent an ST elevation MI but probable NSTEMI.  Troponin was 2400.  The patient proceeded to cath where he was found to have an ostial to proximal LAD lesion which was 100% stenosed in the mid LAD 50% stenosis, LV function was felt to be mildly reduced with an EF of 40%.  He received a DES to the proximal LAD and DES to the mid LAD.  He was initiated on aspirin and Brilinta for dual antiplatelet therapy for 12 months.  Echocardiogram obtained showed EF 40 to 45%.  Though the dismissal summary mentions possibility of LV thrombus, echocardiogram mentioned excluding thrombus, none was definitively seen. Limited echo pursued with contrast and no LV thrombus was noted.  Symptoms today: Chest pressure - morning and evening, with exertion and can be at rest. Not like mi pain in his opinion. Daily at least once. Lasts minutes, 5-10, he will push through, 10-20 mins before it goes away.  He was encouraged by cardiac rehab to take nitroglycerin when he experienced chest pressure, and his chest discomfort is nitro responsive.  Fast hr 160 -  no sx when exercising.  Incidentally noted on his smart watch and when he reported in his cardiac rehab journal.  It can occur while exercising or at rest but he has fast recovery.  No palpitations.  Continues on medical therapy for ischemic cardiomyopathy with mildly-moderately reduced ejection fraction.  Has mild positional dizziness but otherwise is overall well.  Blood pressure is low today.  Heart rate normal.  Continues on carvedilol 6.25 mg twice daily, Entresto 24-26 mg twice daily, aspirin 81 mg daily, ticagrelor 90 mg twice daily.  No missed doses of dual antiplatelet therapy.  Past Medical History:  Diagnosis Date  . Coronary artery disease   . GERD (gastroesophageal reflux disease)   . H/O scarlet fever     Past Surgical History:  Procedure Laterality Date  . BACK SURGERY    . CARDIAC CATHETERIZATION    . CORONARY STENT INTERVENTION  08/06/2019  . CORONARY STENT INTERVENTION N/A 08/06/2019   Procedure: CORONARY STENT INTERVENTION;  Surgeon: Troy Sine, MD;  Location: [place] [facility];  Service: Cardiovascular;  Laterality: N/A;  . LEFT HEART CATH AND CORONARY ANGIOGRAPHY N/A 08/06/2019   Procedure: LEFT HEART CATH AND CORONARY ANGIOGRAPHY;  Surgeon: Troy Sine, MD;  Location: [place] [facility];  Service: Cardiovascular;  Laterality: N/A;    Current Medications: Current Meds  Medication Sig  . aspirin 81 MG chewable tablet Chew 1 tablet (81 mg total) by mouth  daily.  . atorvastatin (LIPITOR) 80 MG tablet Take 1 tablet (80 mg total) by mouth daily at 6 PM.  . carvedilol (COREG) 6.25 MG tablet Take 1 tablet (6.25 mg total) by mouth 2 (two) times daily with a meal.  . Esomeprazole Magnesium (NEXIUM PO) Take 1 tablet by mouth daily.   Marland Kitchen Fexofenadine HCl (ALLEGRA PO) Take 1 tablet by mouth daily.   . nitroGLYCERIN (NITROSTAT) 0.4 MG SL tablet Place 1 tablet (0.4 mg total) under the tongue every 5 (five) minutes x 3 doses as needed for chest pain.  .  sacubitril-valsartan (ENTRESTO) 24-26 MG Take 1 tablet by mouth 2 (two) times daily.  . ticagrelor (BRILINTA) 90 MG TABS tablet Take 1 tablet (90 mg total) by mouth 2 (two) times daily.     Allergies:   Patient has no known allergies.   Social History   Socioeconomic History  . Marital status: Single    Spouse name: Not on file  . Number of children: 2  . Years of education: 16  . Highest education level: Bachelor's degree (e.g., BA, AB, BS)  Occupational History  . Not on file  Tobacco Use  . Smoking status: Never Smoker  . Smokeless tobacco: Never Used  Substance and Sexual Activity  . Alcohol use: Yes  . Drug use: Never  . Sexual activity: Not on file  Other Topics Concern  . Not on file  Social History Narrative  . Not on file   Social Determinants of Health   Financial Resource Strain:   . Difficulty of Paying Living Expenses: Not on file  Food Insecurity:   . Worried About Charity fundraiser in the Last Year: Not on file  . Ran Out of Food in the Last Year: Not on file  Transportation Needs:   . Lack of Transportation (Medical): Not on file  . Lack of Transportation (Non-Medical): Not on file  Physical Activity:   . Days of Exercise per Week: Not on file  . Minutes of Exercise per Session: Not on file  Stress:   . Feeling of Stress : Not on file  Social Connections:   . Frequency of Communication with Friends and Family: Not on file  . Frequency of Social Gatherings with Friends and Family: Not on file  . Attends Religious Services: Not on file  . Active Member of Clubs or Organizations: Not on file  . Attends Archivist Meetings: Not on file  . Marital Status: Not on file     Family History: The patient's family history includes CAD in his father; Cancer in his father.  ROS:   Please see the history of present illness.    All other systems reviewed and are negative.  EKGs/Labs/Other Studies Reviewed:    The following studies were reviewed  today:  EKG: Normal sinus rhythm, rate 88, rightward axis, septal infarct.  Compared to the ECG 08/15/2019, improvement in the anterior T wave abnormality related to NSTEMI with ostial 100% occlusion of the LAD.  Recent Labs: 08/07/2019: BUN 7; Creatinine, Ser 0.72; Potassium 3.7; Sodium 139 08/08/2019: Hemoglobin 13.2; Platelets 217  Recent Lipid Panel    Component Value Date/Time   CHOL 178 08/06/2019 0407   TRIG 90 08/06/2019 0407   HDL 39 (L) 08/06/2019 0407   CHOLHDL 4.6 08/06/2019 0407   VLDL 18 08/06/2019 0407   LDLCALC 121 (H) 08/06/2019 0407    Physical Exam:    VS:  BP 93/66   Pulse 88  Temp (!) 96.8 F (36 C)   Ht '5\' 11"'  (1.803 m)   Wt 167 lb 9.6 oz (76 kg)   SpO2 95%   BMI 23.38 kg/m     Wt Readings from Last 5 Encounters:  10/03/19 167 lb 9.6 oz (76 kg)  10/01/19 171 lb (77.6 kg)  09/12/19 171 lb 4.8 oz (77.7 kg)  08/15/19 176 lb 6.4 oz (80 kg)  08/07/19 172 lb 6.4 oz (78.2 kg)     Constitutional: No acute distress Eyes: sclera non-icteric, normal conjunctiva and lids ENMT: normal dentition, moist mucous membranes Cardiovascular: regular rhythm, normal rate, no murmurs. S1 and S2 normal. Radial cath site on right well-healed, small incisional scar. No jugular venous distention.  Respiratory: clear to auscultation bilaterally GI : normal bowel sounds, soft and nontender. No distention.   MSK: extremities warm, well perfused. No edema.  NEURO: grossly nonfocal exam, moves all extremities. PSYCH: alert and oriented x 3, normal mood and affect.      ASSESSMENT:    1. Stable angina pectoris ([place])   2. NSTEMI (non-ST elevated myocardial infarction) ([place])   3. Hyperlipidemia LDL goal <70   4. Ischemic cardiomyopathy   5. Coronary artery disease involving native coronary artery of native heart without angina pectoris   6. Precordial pain    PLAN:    Chest pain -chest pain with typical features including with exertion, though can occur at rest as well,  relieved by rest and nitroglycerin, and substernal to left-sided, it will be important to exclude ischemia early in his recovery from high risk NSTEMI.  We will schedule a treadmill Myoview stress test to evaluate exercise capacity and symptoms with exertion as well as perfusion.  We will follow-up after stress testing.  I am encouraged to see that the ECG T wave abnormality in the anterior leads has improved, though there is evidence of septal Q wave as expected.  Continues on medical therapy with dual antiplatelet therapy. I have instructed the patient that dual antiplatelet therapy should be taken for 1 year without interruption.  We have discussed the consequences of interrupted dual antiplatelet therapy and the risk for in-stent thrombosis.  Continues on atorvastatin, carvedilol, and Entresto for heart failure therapy.  We discussed that if the Myoview is abnormal or he has concerning symptoms on the treadmill, we will have a low threshold for heart catheterization to evaluate his LAD stent and 50% occlusion distal to the stent.  If stress Myoview and treadmill test are normal, we will likely consider long-acting nitrate therapy.  Given his relative hypotension we will have to adjust medications to account for this.  He does get a mild headache with sublingual nitroglycerin.  HLD -continues on atorvastatin 80 mg daily without incident.  We will recheck lipids in a few months.  ICM/CAD -continues on Entresto and carvedilol without difficulties.  Has mild dizziness with change in position, we will observe this over time.  Total time of encounter: 30 minutes total time of encounter, including 20 minutes spent in face-to-face patient care. This time includes coordination of care and counseling regarding chest pain after high risk NSTEMI. Remainder of non-face-to-face time involved reviewing chart documents/testing relevant to the patient encounter and documentation in the medical record.  Cherlynn Kaiser,  MD [place]  [facility]   Medication Adjustments/Labs and Tests Ordered: Current medicines are reviewed at length with the patient today.  Concerns regarding medicines are outlined above.  Orders Placed This Encounter  Procedures  . Myocardial  Perfusion Imaging  . EKG 12-Lead   No orders of the defined types were placed in this encounter.   Patient Instructions  Medication Instructions: Continue Same Medications  Lab Work: None ordered   Testing/Procedures: Stress Myoview   Follow-Up: At [facility], you and your health needs are our priority.  As part of our continuing mission to provide you with exceptional heart care, we have created designated Provider Care Teams.  These Care Teams include your primary Cardiologist (physician) and Advanced Practice Providers (APPs -  Physician Assistants and Nurse Practitioners) who all work together to provide you with the care you need, when you need it.  Your next appointment:  After Stress Myoview   The format for your next appointment:  Office   Provider:  Dr.Zaniya Mcaulay

## 2019-10-02 NOTE — Telephone Encounter (Signed)
Lm to call back ./cy 

## 2019-10-02 NOTE — Telephone Encounter (Signed)
Spoke with pt and has noted chest pain with activity and pt has taken ntg a few times with relief Per pt this pain is different than when had MI but at the same time ntg has relieved pain  Appt made with Dr Margaretann Loveless for tom at 8:00 am .Adonis Housekeeper

## 2019-10-03 ENCOUNTER — Encounter: Payer: Self-pay | Admitting: Internal Medicine

## 2019-10-03 ENCOUNTER — Ambulatory Visit: Payer: 59 | Admitting: Internal Medicine

## 2019-10-03 ENCOUNTER — Other Ambulatory Visit: Payer: Self-pay

## 2019-10-03 VITALS — BP 93/66 | HR 88 | Temp 96.8°F | Ht 71.0 in | Wt 167.6 lb

## 2019-10-03 DIAGNOSIS — E785 Hyperlipidemia, unspecified: Secondary | ICD-10-CM

## 2019-10-03 DIAGNOSIS — I255 Ischemic cardiomyopathy: Secondary | ICD-10-CM

## 2019-10-03 DIAGNOSIS — I208 Other forms of angina pectoris: Secondary | ICD-10-CM | POA: Diagnosis not present

## 2019-10-03 DIAGNOSIS — I214 Non-ST elevation (NSTEMI) myocardial infarction: Secondary | ICD-10-CM | POA: Diagnosis not present

## 2019-10-03 DIAGNOSIS — I2089 Other forms of angina pectoris: Secondary | ICD-10-CM

## 2019-10-03 DIAGNOSIS — I2510 Atherosclerotic heart disease of native coronary artery without angina pectoris: Secondary | ICD-10-CM

## 2019-10-03 DIAGNOSIS — R072 Precordial pain: Secondary | ICD-10-CM

## 2019-10-03 NOTE — Patient Instructions (Addendum)
Medication Instructions: Continue Same Medications  Lab Work: None ordered   Testing/Procedures: Stress Myoview   Follow-Up: At [facility], you and your health needs are our priority.  As part of our continuing mission to provide you with exceptional heart care, we have created designated Provider Care Teams.  These Care Teams include your primary Cardiologist (physician) and Advanced Practice Providers (APPs -  Physician Assistants and Nurse Practitioners) who all work together to provide you with the care you need, when you need it.  Your next appointment:  After Stress Myoview   The format for your next appointment:  Office   Provider:  Dr.Acharya

## 2019-10-04 ENCOUNTER — Other Ambulatory Visit (HOSPITAL_COMMUNITY): Payer: 59

## 2019-10-04 ENCOUNTER — Telehealth (HOSPITAL_COMMUNITY): Payer: Self-pay | Admitting: *Deleted

## 2019-10-04 ENCOUNTER — Encounter (HOSPITAL_COMMUNITY)
Admission: RE | Admit: 2019-10-04 | Discharge: 2019-10-04 | Disposition: A | Discharge status: Home / Self Care | Payer: 59 | Source: Ambulatory Visit | Attending: Internal Medicine | Admitting: Internal Medicine

## 2019-10-04 NOTE — Telephone Encounter (Signed)
Spoke to [place] this morning regarding the virtual cardiac rehab better heats APP. Jeff Barnes is exercising 4-6 days a week and says that using the better hearts APP helps to keep him accountable. Jeff Barnes says that he generally feels good and is also using exercise bands at home. Jeff Barnes also he does not feel that he chest pressure he as experienced is not as bad as he talked to Dr Margaretann Loveless yesterday. Jeff Barnes appreciates the consultation with Michaele Offer RD so that he has more choices in his diet.Barnet Pall, RN,BSN 10/04/2019 10:33 AM

## 2019-10-05 ENCOUNTER — Other Ambulatory Visit: Payer: Self-pay | Admitting: Internal Medicine

## 2019-10-07 ENCOUNTER — Other Ambulatory Visit (HOSPITAL_COMMUNITY)
Admission: RE | Admit: 2019-10-07 | Discharge: 2019-10-07 | Disposition: A | Discharge status: Home / Self Care | Payer: 59 | Source: Ambulatory Visit | Attending: Cardiovascular Disease | Admitting: Cardiovascular Disease

## 2019-10-07 DIAGNOSIS — Z20822 Contact with and (suspected) exposure to covid-19: Secondary | ICD-10-CM | POA: Insufficient documentation

## 2019-10-07 DIAGNOSIS — Z01812 Encounter for preprocedural laboratory examination: Secondary | ICD-10-CM | POA: Diagnosis not present

## 2019-10-07 LAB — SARS CORONAVIRUS 2 (TAT 6-24 HRS): SARS Coronavirus 2: NEGATIVE

## 2019-10-08 ENCOUNTER — Encounter (HOSPITAL_COMMUNITY): Payer: 59

## 2019-10-08 ENCOUNTER — Telehealth (HOSPITAL_COMMUNITY): Payer: Self-pay

## 2019-10-08 NOTE — Telephone Encounter (Signed)
Encounter complete. 

## 2019-10-10 ENCOUNTER — Ambulatory Visit (HOSPITAL_COMMUNITY)
Admission: RE | Admit: 2019-10-10 | Discharge: 2019-10-10 | Disposition: A | Discharge status: Home / Self Care | Payer: 59 | Source: Ambulatory Visit | Attending: Cardiovascular Disease | Admitting: Cardiovascular Disease

## 2019-10-10 ENCOUNTER — Other Ambulatory Visit: Payer: Self-pay

## 2019-10-10 DIAGNOSIS — R072 Precordial pain: Secondary | ICD-10-CM | POA: Insufficient documentation

## 2019-10-10 LAB — MYOCARDIAL PERFUSION IMAGING
Estimated workload: 13.4 METS
Exercise duration (min): 12 min
Exercise duration (sec): 0 s
LV dias vol: 116 mL (ref 62–150)
LV sys vol: 50 mL
MPHR: 173 {beats}/min
Peak HR: 162 {beats}/min
Percent HR: 93 %
Rest HR: 71 {beats}/min
SDS: 1
SRS: 0
SSS: 1
TID: 0.89

## 2019-10-10 MED ORDER — TECHNETIUM TC 99M TETROFOSMIN IV KIT
31.5000 | PACK | Freq: Once | INTRAVENOUS | Status: AC | PRN
  Administered 2019-10-10: 31.5 via INTRAVENOUS
  Filled 2019-10-10: qty 32

## 2019-10-10 MED ORDER — TECHNETIUM TC 99M TETROFOSMIN IV KIT
10.1000 | PACK | Freq: Once | INTRAVENOUS | Status: AC | PRN
  Administered 2019-10-10: 10.1 via INTRAVENOUS
  Filled 2019-10-10: qty 11

## 2019-10-11 ENCOUNTER — Telehealth (HOSPITAL_COMMUNITY): Payer: Self-pay

## 2019-10-11 NOTE — Telephone Encounter (Signed)
Pt insurance is active and benefits verified through [employer] Co-pay $25, DED $500/0 met, out of pocket $3,000/$163.64 met, co-insurance 0%. no pre-authorization required. Passport, 10/11/2019@1:22pm, REF# [id number]  Will contact patient to see if he is interested in the Cardiac Rehab Program. If interested, patient will need to complete follow up appt. Once completed, patient will be contacted for scheduling upon review by the RN Navigator. 

## 2019-10-14 ENCOUNTER — Ambulatory Visit: Payer: 59 | Admitting: Internal Medicine

## 2019-10-14 NOTE — Addendum Note (Signed)
Encounter addended by: Sol Passer on: 10/14/2019 11:31 AM  Actions taken: Flowsheet data copied forward, Flowsheet accepted

## 2019-10-14 NOTE — Addendum Note (Signed)
Encounter addended by: Sol Passer on: 10/14/2019 11:37 AM  Actions taken: Flowsheet accepted

## 2019-10-15 ENCOUNTER — Telehealth (HOSPITAL_COMMUNITY): Payer: Self-pay

## 2019-10-15 NOTE — Telephone Encounter (Signed)
Pt called to return phone call. Pt would love to return for in person exercise, but states work schedule is hectic. Pt will continue to participate in virtual cardiac rehab. Encouraged pt to call and schedule in person exercise once his schedule levels out. Will continue to follow pt on Better Hearts App.   Carma Lair MS, ACSM CEP 9:22 AM 10/15/2019

## 2019-10-16 ENCOUNTER — Encounter (HOSPITAL_COMMUNITY): Payer: Self-pay | Admitting: *Deleted

## 2019-10-16 DIAGNOSIS — I214 Non-ST elevation (NSTEMI) myocardial infarction: Secondary | ICD-10-CM

## 2019-10-16 DIAGNOSIS — Z955 Presence of coronary angioplasty implant and graft: Secondary | ICD-10-CM

## 2019-10-16 NOTE — Progress Notes (Signed)
Cardiac Individual Treatment Plan  Patient Details  Name: Jeff Barnes MRN: [id number] Date of Birth: Aug 18, 1971 Referring Provider:     CARDIAC REHAB PHASE II ORIENTATION from 09/12/2019 in [facility]  Referring Provider  Cherlynn Kaiser A MD      Initial Encounter Date:    CARDIAC REHAB PHASE II ORIENTATION from 09/12/2019 in [facility]  Date  09/12/19      Visit Diagnosis: NSTEMI (non-ST elevated myocardial infarction) ([place]) 08/05/19  S/P drug eluting coronary stent placement 08/06/19  Patient's Home Medications on Admission:  Current Outpatient Medications:  .  aspirin 81 MG chewable tablet, Chew 1 tablet (81 mg total) by mouth daily., Disp: 90 tablet, Rfl: 3 .  atorvastatin (LIPITOR) 80 MG tablet, Take 1 tablet (80 mg total) by mouth daily at 6 PM., Disp: 90 tablet, Rfl: 3 .  carvedilol (COREG) 6.25 MG tablet, Take 1 tablet (6.25 mg total) by mouth 2 (two) times daily with a meal., Disp: 180 tablet, Rfl: 3 .  Esomeprazole Magnesium (NEXIUM PO), Take 1 tablet by mouth daily. , Disp: , Rfl:  .  Fexofenadine HCl (ALLEGRA PO), Take 1 tablet by mouth daily. , Disp: , Rfl:  .  nitroGLYCERIN (NITROSTAT) 0.4 MG SL tablet, Place 1 tablet (0.4 mg total) under the tongue every 5 (five) minutes x 3 doses as needed for chest pain., Disp: 25 tablet, Rfl: 1 .  sacubitril-valsartan (ENTRESTO) 24-26 MG, Take 1 tablet by mouth 2 (two) times daily., Disp: 180 tablet, Rfl: 3 .  ticagrelor (BRILINTA) 90 MG TABS tablet, Take 1 tablet (90 mg total) by mouth 2 (two) times daily., Disp: 180 tablet, Rfl: 3  Past Medical History: Past Medical History:  Diagnosis Date  . Coronary artery disease   . GERD (gastroesophageal reflux disease)   . H/O scarlet fever     Tobacco Use: Social History   Tobacco Use  Smoking Status Never Smoker  Smokeless Tobacco Never Used    Labs: Recent Review Scientist, physiological    Labs for ITP Cardiac  and Pulmonary Rehab Latest Ref Rng & Units 08/05/2019 08/06/2019   Cholestrol 0 - 200 mg/dL - 178   LDLCALC 0 - 99 mg/dL - 121(H)   HDL >40 mg/dL - 39(L)   Trlycerides <150 mg/dL - 90   Hemoglobin A1c 4.8 - 5.6 % 5.6 -      Capillary Blood Glucose: No results found for: GLUCAP   Exercise Target Goals: Exercise Program Goal: Individual exercise prescription set using results from initial 6 min walk test and THRR while considering  patient's activity barriers and safety.   Exercise Prescription Goal: Starting with aerobic activity 30 plus minutes a day, 3 days per week for initial exercise prescription. Provide home exercise prescription and guidelines that participant acknowledges understanding prior to discharge.  Activity Barriers & Risk Stratification: Activity Barriers & Cardiac Risk Stratification - 09/12/19 1423      Activity Barriers & Cardiac Risk Stratification   Activity Barriers  None    Cardiac Risk Stratification  High       6 Minute Walk: 6 Minute Walk    Row Name 09/12/19 1330         6 Minute Walk   Phase  Initial     Distance  1852 feet     Walk Time  6 minutes     # of Rest Breaks  0     MPH  3.51  METS  5.3     RPE  11     Perceived Dyspnea   0     VO2 Peak  18.5     Symptoms  No     Resting HR  83 bpm     Resting BP  108/80     Resting Oxygen Saturation   97 %     Exercise Oxygen Saturation  during 6 min walk  97 %     Max Ex. HR  108 bpm     Max Ex. BP  116/62     2 Minute Post BP  102/60        Oxygen Initial Assessment:   Oxygen Re-Evaluation:   Oxygen Discharge (Final Oxygen Re-Evaluation):   Initial Exercise Prescription: Initial Exercise Prescription - 09/12/19 1300      Date of Initial Exercise RX and Referring Provider   Date  09/12/19    Referring Provider  Elouise Munroe MD      Track   Minutes  45      Prescription Details   Frequency (times per week)  3x    Duration  Progress to 30 minutes of continuous  aerobic without signs/symptoms of physical distress      Intensity   THRR 40-80% of Max Heartrate  69-138    Ratings of Perceived Exertion  11-13    Perceived Dyspnea  0-4      Progression   Progression  Continue progressive overload as per policy without signs/symptoms or physical distress.      Resistance Training   Training Prescription  No       Perform Capillary Blood Glucose checks as needed.  Exercise Prescription Changes:  Exercise Prescription Changes    Row Name 10/12/19 1127             Response to Exercise   Heart Rate (Exercise)  139 bpm       Rating of Perceived Exertion (Exercise)  13       Symptoms  none       Comments  Virtual cardiac rehab session.         Progression   Progression  Continue to progress workloads to maintain intensity without signs/symptoms of physical distress.         Resistance Training   Training Prescription  Yes       Weight  -- Exercise bands         Track   Minutes  120         Home Exercise Plan   Plans to continue exercise at  Home (comment) Walking       Initial Home Exercises Provided  09/12/19          Exercise Comments:  Exercise Comments    Row Name 10/14/19 1131           Exercise Comments  Patient will be contacted to begin participation in the onsite cardiac rehab program after temporary closure due to COVID-19 pandemic.          Exercise Goals and Review:  Exercise Goals    Row Name 09/12/19 1339             Exercise Goals   Increase Physical Activity  Yes       Intervention  Provide advice, education, support and counseling about physical activity/exercise needs.;Develop an individualized exercise prescription for aerobic and resistive training based on initial evaluation findings, risk stratification, comorbidities and participant's personal goals.  Expected Outcomes  Short Term: Attend rehab on a regular basis to increase amount of physical activity.;Long Term: Add in home exercise to  make exercise part of routine and to increase amount of physical activity.;Long Term: Exercising regularly at least 3-5 days a week.       Increase Strength and Stamina  Yes       Intervention  Provide advice, education, support and counseling about physical activity/exercise needs.;Develop an individualized exercise prescription for aerobic and resistive training based on initial evaluation findings, risk stratification, comorbidities and participant's personal goals.       Expected Outcomes  Short Term: Increase workloads from initial exercise prescription for resistance, speed, and METs.;Short Term: Perform resistance training exercises routinely during rehab and add in resistance training at home;Long Term: Improve cardiorespiratory fitness, muscular endurance and strength as measured by increased METs and functional capacity (6MWT)       Able to understand and use rate of perceived exertion (RPE) scale  Yes       Intervention  Provide education and explanation on how to use RPE scale       Expected Outcomes  Short Term: Able to use RPE daily in rehab to express subjective intensity level;Long Term:  Able to use RPE to guide intensity level when exercising independently       Knowledge and understanding of Target Heart Rate Range (THRR)  Yes       Intervention  Provide education and explanation of THRR including how the numbers were predicted and where they are located for reference       Expected Outcomes  Short Term: Able to state/look up THRR;Long Term: Able to use THRR to govern intensity when exercising independently;Short Term: Able to use daily as guideline for intensity in rehab       Able to check pulse independently  Yes       Intervention  Provide education and demonstration on how to check pulse in carotid and radial arteries.;Review the importance of being able to check your own pulse for safety during independent exercise       Expected Outcomes  Short Term: Able to explain why pulse  checking is important during independent exercise;Long Term: Able to check pulse independently and accurately       Understanding of Exercise Prescription  Yes       Intervention  Provide education, explanation, and written materials on patient's individual exercise prescription       Expected Outcomes  Short Term: Able to explain program exercise prescription;Long Term: Able to explain home exercise prescription to exercise independently          Exercise Goals Re-Evaluation : Exercise Goals Re-Evaluation    Row Name 10/14/19 1130             Exercise Goal Re-Evaluation   Comments  Patient has been actively participating in the virtual cardiac rehab program via the [facility] app and has been progressing well. Patient is walking at least 30 minutes,6-7 days/week and using exercise bands for his resistance training.       Expected Outcomes  Patient will transition to the onsite cardiac rehab program if interested.           Discharge Exercise Prescription (Final Exercise Prescription Changes): Exercise Prescription Changes - 10/12/19 1127      Response to Exercise   Heart Rate (Exercise)  139 bpm    Rating of Perceived Exertion (Exercise)  13    Symptoms  none  Comments  Virtual cardiac rehab session.      Progression   Progression  Continue to progress workloads to maintain intensity without signs/symptoms of physical distress.      Resistance Training   Training Prescription  Yes    Weight  --   Exercise bands     Track   Minutes  120      Home Exercise Plan   Plans to continue exercise at  Home (comment)   Walking   Initial Home Exercises Provided  09/12/19       Nutrition:  Target Goals: Understanding of nutrition guidelines, daily intake of sodium 1500mg , cholesterol 200mg , calories 30% from fat and 7% or less from saturated fats, daily to have 5 or more servings of fruits and vegetables.  Biometrics: Pre Biometrics - 09/12/19 1331      Pre  Biometrics   Height  5' 9.75" (1.772 m)    Weight  171 lb 4.8 oz (77.7 kg)    Waist Circumference  35 inches    Hip Circumference  36 inches    Waist to Hip Ratio  0.97 %    BMI (Calculated)  24.75    Triceps Skinfold  12 mm    % Body Fat  22.3 %    Grip Strength  44 kg    Flexibility  17 in    Single Leg Stand  30 seconds        Nutrition Therapy Plan and Nutrition Goals: Nutrition Therapy & Goals - 10/01/19 1012      Nutrition Therapy   Diet  Heart Health    Drug/Food Interactions  Statins/Certain Fruits      Personal Nutrition Goals   Nutrition Goal  Pt to eat a variety of foods and include unsaturated fats, whole grains, fruits, and veggies to have balanced meals    Personal Goal #2  Pt to decrease stress around eating by incorporating a variety of foods      Intervention Plan   Intervention  Prescribe, educate and counsel regarding individualized specific dietary modifications aiming towards targeted core components such as weight, hypertension, lipid management, diabetes, heart failure and other comorbidities.;Nutrition handout(s) given to patient.    Expected Outcomes  Long Term Goal: Adherence to prescribed nutrition plan.;Short Term Goal: A plan has been developed with personal nutrition goals set during dietitian appointment.       Nutrition Assessments: Nutrition Assessments - 10/01/19 1012      MEDFICTS Scores   Pre Score  6       Nutrition Goals Re-Evaluation: Nutrition Goals Re-Evaluation    Row Name 10/01/19 1014             Goals   Current Weight  171 lb (77.6 kg)       Nutrition Goal  Pt to eat a variety of foods and include unsaturated fats, whole grains, fruits, and veggies to have balanced meals         Personal Goal #2 Re-Evaluation   Personal Goal #2  Pt to decrease stress around eating by incorporating a variety of foods          Nutrition Goals Discharge (Final Nutrition Goals Re-Evaluation): Nutrition Goals Re-Evaluation - 10/01/19  1014      Goals   Current Weight  171 lb (77.6 kg)    Nutrition Goal  Pt to eat a variety of foods and include unsaturated fats, whole grains, fruits, and veggies to have balanced meals      Personal  Goal #2 Re-Evaluation   Personal Goal #2  Pt to decrease stress around eating by incorporating a variety of foods       Psychosocial: Target Goals: Acknowledge presence or absence of significant depression and/or stress, maximize coping skills, provide positive support system. Participant is able to verbalize types and ability to use techniques and skills needed for reducing stress and depression.  Initial Review & Psychosocial Screening: Initial Psych Review & Screening - 09/12/19 1139      Initial Review   Current issues with  None Identified      Family Dynamics   Good Support System?  Yes   [name] has his girlfriend and two children for support     Barriers   Psychosocial barriers to participate in program  The patient should benefit from training in stress management and relaxation.      Screening Interventions   Interventions  Encouraged to exercise    Expected Outcomes  Short Term goal: Identification and review with participant of any Quality of Life or Depression concerns found by scoring the questionnaire.;Long Term Goal: Stressors or current issues are controlled or eliminated.       Quality of Life Scores: Quality of Life - 09/12/19 1346      Quality of Life   Select  Quality of Life      Quality of Life Scores   Health/Function Pre  18.07 %    Socioeconomic Pre  17.44 %    Psych/Spiritual Pre  18.93 %    Family Pre  21.5 %    GLOBAL Pre  18.59 %      Scores of 19 and below usually indicate a poorer quality of life in these areas.  A difference of  2-3 points is a clinically meaningful difference.  A difference of 2-3 points in the total score of the Quality of Life Index has been associated with significant improvement in overall quality of life, self-image,  physical symptoms, and general health in studies assessing change in quality of life.  PHQ-9: Recent Review Flowsheet Data    Depression screen [facility] 2/9 09/12/2019 09/12/2019   Decreased Interest 0 0   Down, Depressed, Hopeless 0 0   PHQ - 2 Score 0 0     Interpretation of Total Score  Total Score Depression Severity:  1-4 = Minimal depression, 5-9 = Mild depression, 10-14 = Moderate depression, 15-19 = Moderately severe depression, 20-27 = Severe depression   Psychosocial Evaluation and Intervention:   Psychosocial Re-Evaluation: Psychosocial Re-Evaluation    [place] Name 10/16/19 1203             Psychosocial Re-Evaluation   Current issues with  Current Stress Concerns       Interventions  Encouraged to attend Cardiac Rehabilitation for the exercise       Continue Psychosocial Services   No Follow up required         Initial Review   Source of Stress Concerns  Occupation          Psychosocial Discharge (Final Psychosocial Re-Evaluation): Psychosocial Re-Evaluation - 10/16/19 1203      Psychosocial Re-Evaluation   Current issues with  Current Stress Concerns    Interventions  Encouraged to attend Cardiac Rehabilitation for the exercise    Continue Psychosocial Services   No Follow up required      Initial Review   Source of Stress Concerns  Occupation       Vocational Rehabilitation: Provide vocational rehab assistance to qualifying candidates.  Vocational Rehab Evaluation & Intervention: Vocational Rehab - 09/12/19 1141      Initial Vocational Rehab Evaluation & Intervention   Assessment shows need for Vocational Rehabilitation  No       Education: Education Goals: Education classes will be provided on a weekly basis, covering required topics. Participant will state understanding/return demonstration of topics presented.  Learning Barriers/Preferences: Learning Barriers/Preferences - 09/12/19 1347      Learning Barriers/Preferences   Learning Barriers  None     Learning Preferences  Individual Instruction;Written Material;Verbal Instruction;Skilled Demonstration       Education Topics: Hypertension, Hypertension Reduction -Define heart disease and high blood pressure. Discus how high blood pressure affects the body and ways to reduce high blood pressure.   Exercise and Your Heart -Discuss why it is important to exercise, the FITT principles of exercise, normal and abnormal responses to exercise, and how to exercise safely.   Angina -Discuss definition of angina, causes of angina, treatment of angina, and how to decrease risk of having angina.   Cardiac Medications -Review what the following cardiac medications are used for, how they affect the body, and side effects that may occur when taking the medications.  Medications include Aspirin, Beta blockers, calcium channel blockers, ACE Inhibitors, angiotensin receptor blockers, diuretics, digoxin, and antihyperlipidemics.   Congestive Heart Failure -Discuss the definition of CHF, how to live with CHF, the signs and symptoms of CHF, and how keep track of weight and sodium intake.   Heart Disease and Intimacy -Discus the effect sexual activity has on the heart, how changes occur during intimacy as we age, and safety during sexual activity.   Smoking Cessation / COPD -Discuss different methods to quit smoking, the health benefits of quitting smoking, and the definition of COPD.   Nutrition I: Fats -Discuss the types of cholesterol, what cholesterol does to the heart, and how cholesterol levels can be controlled.   Nutrition II: Labels -Discuss the different components of food labels and how to read food label   Heart Parts/Heart Disease and PAD -Discuss the anatomy of the heart, the pathway of blood circulation through the heart, and these are affected by heart disease.   Stress I: Signs and Symptoms -Discuss the causes of stress, how stress may lead to anxiety and depression,  and ways to limit stress.   Stress II: Relaxation -Discuss different types of relaxation techniques to limit stress.   Warning Signs of Stroke / TIA -Discuss definition of a stroke, what the signs and symptoms are of a stroke, and how to identify when someone is having stroke.   Knowledge Questionnaire Score: Knowledge Questionnaire Score - 09/12/19 1424      Knowledge Questionnaire Score   Pre Score  22/24       Core Components/Risk Factors/Patient Goals at Admission: Personal Goals and Risk Factors at Admission - 09/12/19 1347      Core Components/Risk Factors/Patient Goals on Admission   Intervention  Provide education and support for participant on nutrition & aerobic/resistive exercise along with prescribed medications to achieve LDL 70mg , HDL >40mg .    Expected Outcomes  Short Term: Participant states understanding of desired cholesterol values and is compliant with medications prescribed. Participant is following exercise prescription and nutrition guidelines.;Long Term: Cholesterol controlled with medications as prescribed, with individualized exercise RX and with personalized nutrition plan. Value goals: LDL < 70mg , HDL > 40 mg.       Core Components/Risk Factors/Patient Goals Review:  Goals and Risk Factor Review  [place] Name 10/16/19 1204             Core Components/Risk Factors/Patient Goals Review   Personal Goals Review  Weight Management/Obesity;Hypertension;Lipids;Stress       Review  [name] has been exercising 4-6 days a week and using the virtual [facility] cardiac rehab APP       Expected Outcomes  [name] will continue to participate in cardiac rehab for exercise, nutrtion and lifestyle modifications          Core Components/Risk Factors/Patient Goals at Discharge (Final Review):  Goals and Risk Factor Review - 10/16/19 1204      Core Components/Risk Factors/Patient Goals Review   Personal Goals Review  Weight  Management/Obesity;Hypertension;Lipids;Stress    Review  [name] has been exercising 4-6 days a week and using the virtual [facility] cardiac rehab APP    Expected Outcomes  [name] will continue to participate in cardiac rehab for exercise, nutrtion and lifestyle modifications       ITP Comments: ITP Comments    Row Name 09/12/19 1138 10/16/19 1147         ITP Comments  Dr Fransico Him MD, Medical Director  30 Day ITP Review. [name] is doing well with exercise and using the Virtual [facility] Cardiac Rehab APP         Comments: See ITP comments.Harrell Gave RN BSN

## 2019-10-28 ENCOUNTER — Encounter (HOSPITAL_COMMUNITY)
Admission: RE | Admit: 2019-10-28 | Discharge: 2019-10-28 | Disposition: A | Discharge status: Home / Self Care | Payer: 59 | Source: Ambulatory Visit | Attending: Internal Medicine | Admitting: Internal Medicine

## 2019-10-28 DIAGNOSIS — I214 Non-ST elevation (NSTEMI) myocardial infarction: Secondary | ICD-10-CM | POA: Insufficient documentation

## 2019-10-28 DIAGNOSIS — Z955 Presence of coronary angioplasty implant and graft: Secondary | ICD-10-CM

## 2019-10-28 NOTE — Progress Notes (Signed)
Cardiac Rehab: Virtual Visit  Patient participates in the virtual cardiac rehab program via the [facility] app. Spoke with patient briefly regarding progress with exercise at home. Patient is exercising without difficulty at this time and consistently logging his exercise in the app. Patient is walking or hiking 30-60 minutes, 6 days/week and using an exercise band for his resistance training. Will continue to follow progress in the app.  Sol Passer, MS, ACSM CEP 10/28/2019 1355

## 2019-11-07 ENCOUNTER — Ambulatory Visit: Payer: 59 | Attending: Internal Medicine

## 2019-11-07 DIAGNOSIS — Z23 Encounter for immunization: Secondary | ICD-10-CM

## 2019-11-07 NOTE — Progress Notes (Signed)
   Covid-19 Vaccination Clinic  Name:  Jeff Barnes    MRN: [id number] DOB: 04/26/1971  11/07/2019  [name] was observed post Covid-19 immunization for 15 minutes without incident. He was provided with Vaccine Information Sheet and instruction to access the V-Safe system.   [name] was instructed to call 911 with any severe reactions post vaccine: Marland Kitchen Difficulty breathing  . Swelling of face and throat  . A fast heartbeat  . A bad rash all over body  . Dizziness and weakness

## 2019-11-13 ENCOUNTER — Ambulatory Visit (HOSPITAL_COMMUNITY): Payer: 59 | Attending: Cardiovascular Disease

## 2019-11-13 ENCOUNTER — Other Ambulatory Visit: Payer: Self-pay

## 2019-11-13 DIAGNOSIS — I214 Non-ST elevation (NSTEMI) myocardial infarction: Secondary | ICD-10-CM | POA: Diagnosis not present

## 2019-11-18 ENCOUNTER — Ambulatory Visit: Payer: 59 | Admitting: Internal Medicine

## 2019-11-18 ENCOUNTER — Encounter: Payer: Self-pay | Admitting: Internal Medicine

## 2019-11-18 ENCOUNTER — Other Ambulatory Visit: Payer: Self-pay

## 2019-11-18 VITALS — BP 117/84 | HR 84 | Temp 97.1°F | Ht 70.0 in | Wt 171.0 lb

## 2019-11-18 DIAGNOSIS — I214 Non-ST elevation (NSTEMI) myocardial infarction: Secondary | ICD-10-CM

## 2019-11-18 DIAGNOSIS — I255 Ischemic cardiomyopathy: Secondary | ICD-10-CM

## 2019-11-18 DIAGNOSIS — I2510 Atherosclerotic heart disease of native coronary artery without angina pectoris: Secondary | ICD-10-CM | POA: Diagnosis not present

## 2019-11-18 DIAGNOSIS — E785 Hyperlipidemia, unspecified: Secondary | ICD-10-CM

## 2019-11-18 NOTE — Progress Notes (Signed)
Cardiology Office Note:    Date:  11/18/2019   ID:  Jeff Barnes, DOB [birth year], MRN [id number]  PCP:  Carol Ada, MD  Cardiologist:  Elouise Munroe, MD  Electrophysiologist:  None   Referring MD: Carol Ada, MD   Chief Complaint:   History of Present Illness:    Jeff Barnes is a 49 y.o. male with a history of GERD and HLD who presents for follow-up after hospitalization for NSTEMI.   I have independently reviewed the echocardiogram that was performed 3 months after his initial echo.  There is still subtle LAD distribution wall motion abnormalities, but ejection fraction has normalized and appears to be approximately 55%.  I discussed with the patient the comparison to the prior echo, and that there has been a noticeable improvement.  No difficulties with symptoms on heart failure therapy.  We will continue to revisit this medication regimen with an eye towards cost of therapy, however the patient has no significant issues at this time.  No recurrence of chest pain which was the subject of our last office visit.  Past Medical History:  Diagnosis Date  . Coronary artery disease   . GERD (gastroesophageal reflux disease)   . H/O scarlet fever     Past Surgical History:  Procedure Laterality Date  . BACK SURGERY    . CARDIAC CATHETERIZATION    . CORONARY STENT INTERVENTION  08/06/2019  . CORONARY STENT INTERVENTION N/A 08/06/2019   Procedure: CORONARY STENT INTERVENTION;  Surgeon: Troy Sine, MD;  Location: [place] [facility];  Service: Cardiovascular;  Laterality: N/A;  . LEFT HEART CATH AND CORONARY ANGIOGRAPHY N/A 08/06/2019   Procedure: LEFT HEART CATH AND CORONARY ANGIOGRAPHY;  Surgeon: Troy Sine, MD;  Location: [place] [facility];  Service: Cardiovascular;  Laterality: N/A;    Current Medications: Current Meds  Medication Sig  . aspirin 81 MG chewable tablet Chew 1 tablet (81 mg total) by mouth daily.  Marland Kitchen atorvastatin (LIPITOR) 80 MG  tablet Take 1 tablet (80 mg total) by mouth daily at 6 PM.  . carvedilol (COREG) 6.25 MG tablet Take 1 tablet (6.25 mg total) by mouth 2 (two) times daily with a meal.  . Esomeprazole Magnesium (NEXIUM PO) Take 1 tablet by mouth daily.   Marland Kitchen Fexofenadine HCl (ALLEGRA PO) Take 1 tablet by mouth daily.   . nitroGLYCERIN (NITROSTAT) 0.4 MG SL tablet Place 1 tablet (0.4 mg total) under the tongue every 5 (five) minutes x 3 doses as needed for chest pain.  . sacubitril-valsartan (ENTRESTO) 24-26 MG Take 1 tablet by mouth 2 (two) times daily.  . ticagrelor (BRILINTA) 90 MG TABS tablet Take 1 tablet (90 mg total) by mouth 2 (two) times daily.     Allergies:   Patient has no known allergies.   Social History   Socioeconomic History  . Marital status: Single    Spouse name: Not on file  . Number of children: 2  . Years of education: 16  . Highest education level: Bachelor's degree (e.g., BA, AB, BS)  Occupational History  . Not on file  Tobacco Use  . Smoking status: Never Smoker  . Smokeless tobacco: Never Used  Substance and Sexual Activity  . Alcohol use: Yes  . Drug use: Never  . Sexual activity: Not on file  Other Topics Concern  . Not on file  Social History Narrative  . Not on file   Social Determinants of Health   Financial Resource Strain:   .  Difficulty of Paying Living Expenses:   Food Insecurity:   . Worried About Charity fundraiser in the Last Year:   . Arboriculturist in the Last Year:   Transportation Needs:   . Film/video editor (Medical):   Marland Kitchen Lack of Transportation (Non-Medical):   Physical Activity:   . Days of Exercise per Week:   . Minutes of Exercise per Session:   Stress:   . Feeling of Stress :   Social Connections:   . Frequency of Communication with Friends and Family:   . Frequency of Social Gatherings with Friends and Family:   . Attends Religious Services:   . Active Member of Clubs or Organizations:   . Attends Archivist  Meetings:   Marland Kitchen Marital Status:      Family History: The patient's family history includes CAD in his father; Cancer in his father.  ROS:   Please see the history of present illness.    All other systems reviewed and are negative.  EKGs/Labs/Other Studies Reviewed:    The following studies were reviewed today:  EKG: Not performed today  I have independently reviewed the images from echocardiogram 11/13/2019.  Recent Labs: 08/07/2019: BUN 7; Creatinine, Ser 0.72; Potassium 3.7; Sodium 139 08/08/2019: Hemoglobin 13.2; Platelets 217  Recent Lipid Panel    Component Value Date/Time   CHOL 178 08/06/2019 0407   TRIG 90 08/06/2019 0407   HDL 39 (L) 08/06/2019 0407   CHOLHDL 4.6 08/06/2019 0407   VLDL 18 08/06/2019 0407   LDLCALC 121 (H) 08/06/2019 0407    Physical Exam:    VS:  BP 117/84   Pulse 84   Temp (!) 97.1 F (36.2 C)   Ht 5\' 10"  (1.778 m)   Wt 171 lb (77.6 kg)   SpO2 99%   BMI 24.54 kg/m     Wt Readings from Last 5 Encounters:  11/18/19 171 lb (77.6 kg)  10/10/19 167 lb (75.8 kg)  10/03/19 167 lb 9.6 oz (76 kg)  10/01/19 171 lb (77.6 kg)  09/12/19 171 lb 4.8 oz (77.7 kg)     Constitutional: No acute distress Eyes: sclera non-icteric, normal conjunctiva and lids ENMT: normal dentition, moist mucous membranes Cardiovascular: regular rhythm, normal rate, no murmurs. S1 and S2 normal. Radial pulses normal bilaterally. No jugular venous distention.  Respiratory: clear to auscultation bilaterally GI : normal bowel sounds, soft and nontender. No distention.   MSK: extremities warm, well perfused. No edema.  NEURO: grossly nonfocal exam, moves all extremities. PSYCH: alert and oriented x 3, normal mood and affect.      ASSESSMENT:    1. Ischemic cardiomyopathy   2. Hyperlipidemia LDL goal <70   3. NSTEMI (non-ST elevated myocardial infarction) ([place])   4. Coronary artery disease involving native coronary artery of native heart without angina pectoris     PLAN:    Ischemic cardiomyopathy-regional wall motion has improved but there is still subtle LAD distribution wall motion abnormalities on my independent review of the echocardiogram.  However ejection fraction has normalized on heart failure therapy.  I am very pleased to see this and at this time we should continue heart failure therapy.  We discussed that if cost becomes an issue Delene Loll can be transition to an ARB and we will discuss this as time goes on.  NSTEMI-for the current time continue aspirin 81 mg daily and Brilinta 90 mg twice daily.  Will discuss duration of dual antiplatelet therapy closer to the 1  year mark after NSTEMI.  Continue atorvastatin 80 mg daily and carvedilol 6.25 mg twice daily.  Hyperlipidemia-we will recheck CMP and lipids given that it has been approximately 3 months since initiation of atorvastatin.  Follow-up in 3 months time.  Cherlynn Kaiser, MD [place]  [facility]    Medication Adjustments/Labs and Tests Ordered: Current medicines are reviewed at length with the patient today.  Concerns regarding medicines are outlined above.  Orders Placed This Encounter  Procedures  . Lipid panel  . Comprehensive metabolic panel   No orders of the defined types were placed in this encounter.   Patient Instructions  Medication Instructions:  No changes If you need a refill on your cardiac medications before your next appointment, please call your pharmacy*   Lab Work: cmp Lipid - fasting If you have labs (blood work) drawn today and your tests are completely normal, you will receive your results only by: Marland Kitchen MyChart Message (if you have MyChart) OR . A paper copy in the mail If you have any lab test that is abnormal or we need to change your treatment, we will call you to review the results.   Testing/Procedures: not needed  Follow-Up: At [facility], you and your health needs are our priority.  As part of our continuing mission to  provide you with exceptional heart care, we have created designated Provider Care Teams.  These Care Teams include your primary Cardiologist (physician) and Advanced Practice Providers (APPs -  Physician Assistants and Nurse Practitioners) who all work together to provide you with the care you need, when you need it.   Your next appointment:   3 month(s)  The format for your next appointment:   In Person  Provider:   Cherlynn Kaiser, MD

## 2019-11-18 NOTE — Patient Instructions (Signed)
Medication Instructions:  No changes If you need a refill on your cardiac medications before your next appointment, please call your pharmacy*   Lab Work: cmp Lipid - fasting If you have labs (blood work) drawn today and your tests are completely normal, you will receive your results only by: Marland Kitchen MyChart Message (if you have MyChart) OR . A paper copy in the mail If you have any lab test that is abnormal or we need to change your treatment, we will call you to review the results.   Testing/Procedures: not needed  Follow-Up: At [facility], you and your health needs are our priority.  As part of our continuing mission to provide you with exceptional heart care, we have created designated Provider Care Teams.  These Care Teams include your primary Cardiologist (physician) and Advanced Practice Providers (APPs -  Physician Assistants and Nurse Practitioners) who all work together to provide you with the care you need, when you need it.   Your next appointment:   3 month(s)  The format for your next appointment:   In Person  Provider:   Cherlynn Kaiser, MD

## 2019-11-20 ENCOUNTER — Telehealth (HOSPITAL_COMMUNITY): Payer: Self-pay

## 2019-11-20 NOTE — Telephone Encounter (Signed)
Virtual Cardiac Rehab Note:   Successful telephone call to Jeff Barnes to schedule follow up 6 min walktest post completion of the [facility] VCR program. Jeff Barnes will graduate from the app 12/09/19. He is scheduled for a face to face post assessment 12/11/19 at 7:30am. "Homework" including MedFicts, knowledge test, and quality of life assessment placed in mail. Patient instructed to complete and return at above appointment. Patient continues to exercise for 30 min 5-7 days a week. No concerns or complaints discussed.  Plan: follow up appointment scheduled  Graceanna Theissen E. Rollene Rotunda RN, BSN [place]. [facility]  Cardiac and Pulmonary Rehabilitation Phone: [phone] Fax: [phone]

## 2019-11-25 ENCOUNTER — Telehealth (HOSPITAL_COMMUNITY): Payer: Self-pay

## 2019-11-25 NOTE — Telephone Encounter (Signed)
Virtual Cardiac Rehab Note:   Unsuccessful telephone encounter to Mr. Jeff Barnes to reschedule upcoming post VCR 6 min walk test. [employer] complaint VM message left requesting call back.   Shermar Friedland E. Rollene Rotunda RN, BSN [place]. [facility]  Cardiac and Pulmonary Rehabilitation Phone: [phone] Fax: [phone]

## 2019-11-26 ENCOUNTER — Telehealth (HOSPITAL_COMMUNITY): Payer: Self-pay

## 2019-11-26 NOTE — Telephone Encounter (Signed)
Virtual Cardiac Rehab Note:   Successful telephone encounter to Jeff Barnes to re-schedule post VCR 6 min walk test. Jeff Barnes agrees to appointment 12/12/19 at 7:30 am. Appointment changed in EMR.  [name] E. Rollene Rotunda RN, BSN [place]. [facility]  Cardiac and Pulmonary Rehabilitation Phone: [phone] Fax: [phone]

## 2019-12-04 ENCOUNTER — Ambulatory Visit: Payer: 59 | Attending: Internal Medicine

## 2019-12-04 DIAGNOSIS — Z23 Encounter for immunization: Secondary | ICD-10-CM

## 2019-12-04 NOTE — Progress Notes (Signed)
   Covid-19 Vaccination Clinic  Name:  Jeff Barnes    MRN: [id number] DOB: 06/25/1971  12/04/2019  Mr. Jeff Barnes was observed post Covid-19 immunization for 15 minutes without incident. He was provided with Vaccine Information Sheet and instruction to access the V-Safe system.   Mr. Jeff Barnes was instructed to call 911 with any severe reactions post vaccine: Marland Kitchen Difficulty breathing  . Swelling of face and throat  . A fast heartbeat  . A bad rash all over body  . Dizziness and weakness   Immunizations Administered    Name Date Dose VIS Date Route   Pfizer COVID-19 Vaccine 12/04/2019  8:40 AM 0.3 mL 08/16/2019 Intramuscular   Manufacturer: [employer]   Lot: [id number]   [place]: [id number]

## 2019-12-12 ENCOUNTER — Other Ambulatory Visit: Payer: Self-pay

## 2019-12-12 ENCOUNTER — Encounter (HOSPITAL_COMMUNITY)
Admission: RE | Admit: 2019-12-12 | Discharge: 2019-12-12 | Disposition: A | Discharge status: Home / Self Care | Payer: 59 | Source: Ambulatory Visit | Attending: Internal Medicine | Admitting: Internal Medicine

## 2019-12-12 VITALS — BP 114/78 | HR 89 | Temp 97.2°F | Ht 69.75 in | Wt 166.9 lb

## 2019-12-12 DIAGNOSIS — I214 Non-ST elevation (NSTEMI) myocardial infarction: Secondary | ICD-10-CM | POA: Insufficient documentation

## 2019-12-12 DIAGNOSIS — Z955 Presence of coronary angioplasty implant and graft: Secondary | ICD-10-CM | POA: Insufficient documentation

## 2019-12-13 ENCOUNTER — Encounter (HOSPITAL_COMMUNITY): Payer: 59

## 2019-12-27 ENCOUNTER — Encounter (HOSPITAL_COMMUNITY): Payer: Self-pay

## 2019-12-27 NOTE — Progress Notes (Signed)
Discharge Progress Report  Patient Details  Name: Jeff Barnes MRN: [id number] Date of Birth: 1970-11-03 Referring Provider:     [place] from 09/12/2019 in [place]  Referring Provider  Cherlynn Kaiser A MD       Number of Visits: 16 weeks  Reason for Discharge:  Patient reached a stable level of exercise. Patient independent in their exercise. Patient has met program and personal goals.  Smoking History:  Social History   Tobacco Use  Smoking Status Never Smoker  Smokeless Tobacco Never Used    Diagnosis:  No diagnosis found.  ADL UCSD:   Initial Exercise Prescription:   Discharge Exercise Prescription (Final Exercise Prescription Changes):   Functional Capacity: 6 Minute Walk    Row Name 12/12/19 0828         6 Minute Walk   Phase  Discharge     Distance  2520 feet     Distance % Change  36.07 %     Distance Feet Change  668 ft     Walk Time  6 minutes     # of Rest Breaks  0     MPH  4.8     METS  6.7     RPE  11     Perceived Dyspnea   0     VO2 Peak  23.52     Symptoms  No     Resting HR  89 bpm     Resting BP  114/78     Resting Oxygen Saturation   100 %     Exercise Oxygen Saturation  during 6 min walk  99 %     Max Ex. HR  134 bpm     Max Ex. BP  120/60     2 Minute Post BP  112/64        Psychological, QOL, Others - Outcomes: PHQ 2/9: Depression screen [facility] 2/9 09/12/2019 09/12/2019  Decreased Interest 0 0  Down, Depressed, Hopeless 0 0  PHQ - 2 Score 0 0    Quality of Life:   Personal Goals: Goals established at orientation with interventions provided to work toward goal.    Personal Goals Discharge:   Exercise Goals and Review:   Exercise Goals Re-Evaluation: Exercise Goals Re-Evaluation    Row Name 12/25/19 [phone]             Exercise Goal Re-Evaluation   Comments  Patient completed the virtual cardiac rehab program and will continue exercise  independently at this time. Patient's functional capacity increased 36% as measured by 6MWT, strength increased 14% as measured by grip strength test, and flexibility increased 15% as measured by sit-and-reach test.       Expected Outcomes  Patient will continue exercise independently at this time to help maintain health and fitness gains.          Nutrition & Weight - Outcomes:  Post Biometrics - 12/12/19 0829       Post  Biometrics   Height  5' 9.75" (1.772 m)    Weight  166 lb 14.2 oz (75.7 kg)    Waist Circumference  34 inches    Hip Circumference  34 inches    Waist to Hip Ratio  1 %    BMI (Calculated)  24.11    Triceps Skinfold  11 mm    % Body Fat  21.3 %    Grip Strength  50 kg  Flexibility  19.5 in    Single Leg Stand  40 seconds       Nutrition:   Nutrition Discharge: Nutrition Assessments - 12/12/19 0852      MEDFICTS Scores   Post Score  12       Education Questionnaire Score: Knowledge Questionnaire Score - 12/12/19 0824      Knowledge Questionnaire Score   Pre Score  21/24       Goals reviewed with patient; copy given to patient.

## 2020-02-17 ENCOUNTER — Ambulatory Visit: Payer: Self-pay | Admitting: General Surgery

## 2020-02-19 NOTE — Progress Notes (Signed)
DUE TO COVID-19 ONLY ONE VISITOR IS ALLOWED TO COME WITH YOU AND STAY IN THE WAITING ROOM ONLY DURING PRE OP AND PROCEDURE DAY OF SURGERY. THE 1 VISITOR MAY VISIT WITH YOU AFTER SURGERY IN YOUR PRIVATE ROOM DURING VISITING HOURS ONLY!  YOU NEED TO HAVE A COVID 19 TEST ON_______ @_______ , THIS TEST MUST BE DONE BEFORE SURGERY, COME  [place], [place] , [id number].  ([place]) ONCE YOUR COVID TEST IS COMPLETED, PLEASE BEGIN THE QUARANTINE INSTRUCTIONS AS OUTLINED IN YOUR HANDOUT.                [place]  02/19/2020   Your procedure is scheduled on:  03/05/2020   Report to [facility] Main  Entrance   Report to admitting at     1000 AM     Call this number if you have problems the morning of surgery [phone]    Remember: Do not eat food    :After Midnight. BRUSH YOUR TEETH MORNING OF SURGERY AND RINSE YOUR MOUTH OUT, NO CHEWING GUM CANDY OR MINTS.     Take these medicines the morning of surgery with A SIP OF WATER:  Coreg, nexium, Eye drops if needed  DO NOT TAKE ANY DIABETIC MEDICATIONS DAY OF YOUR SURGERY                               You may not have any metal on your body including hair pins and              piercings  Do not wear jewelry, make-up, lotions, powders or perfumes, deodorant             Do not wear nail polish on your fingernails.  Do not shave  48 hours prior to surgery.              Men may shave face and neck.   Do not bring valuables to the hospital. [place].  Contacts, dentures or bridgework may not be worn into surgery.  Leave suitcase in the car. After surgery it may be brought to your room.     Patients discharged the day of surgery will not be allowed to drive home. IF YOU ARE HAVING SURGERY AND GOING HOME THE SAME DAY, YOU MUST HAVE AN ADULT TO DRIVE YOU HOME AND BE WITH YOU FOR 24 HOURS. YOU MAY GO HOME BY TAXI OR UBER OR ORTHERWISE, BUT AN ADULT MUST  ACCOMPANY YOU HOME AND STAY WITH YOU FOR 24 HOURS.  Name and phone number of your driver:  Special Instructions: N/A              Please read over the following fact sheets you were given: _____________________________________________________________________             NO SOLID FOOD AFTER MIDNIGHT THE NIGHT PRIOR TO SURGERY. NOTHING BY MOUTH EXCEPT CLEAR LIQUIDS UNTIL   0900am . PLEASE FINISH ENSURE DRINK PER SURGEON ORDER  WHICH NEEDS TO BE COMPLETED AT0900am.   CLEAR LIQUID DIET   Foods Allowed  Foods Excluded  Coffee and tea, regular and decaf                             liquids that you cannot  Plain Jell-O any favor except red or purple                                           see through such as: Fruit ices (not with fruit pulp)                                     milk, soups, orange juice  Iced Popsicles                                    All solid food Carbonated beverages, regular and diet                                    Cranberry, grape and apple juices Sports drinks like Gatorade Lightly seasoned clear broth or consume(fat free) Sugar, honey syrup  Sample Menu Breakfast                                Lunch                                     Supper Cranberry juice                    Beef broth                            Chicken broth Jell-O                                     Grape juice                           Apple juice Coffee or tea                        Jell-O                                      Popsicle                                                Coffee or tea                        Coffee or tea  _____________________________________________________________________  [facility] - Preparing for Surgery Before surgery, you can play an important role.  Because skin is not sterile, your skin  needs to be as free of germs as possible.  You can reduce the number of germs on your skin by  washing with CHG (chlorahexidine gluconate) soap before surgery.  CHG is an antiseptic cleaner which kills germs and bonds with the skin to continue killing germs even after washing. Please DO NOT use if you have an allergy to CHG or antibacterial soaps.  If your skin becomes reddened/irritated stop using the CHG and inform your nurse when you arrive at Short Stay. Do not shave (including legs and underarms) for at least 48 hours prior to the first CHG shower.  You may shave your face/neck. Please follow these instructions carefully:  1.  Shower with CHG Soap the night before surgery and the  morning of Surgery.  2.  If you choose to wash your hair, wash your hair first as usual with your  normal  shampoo.  3.  After you shampoo, rinse your hair and body thoroughly to remove the  shampoo.                           4.  Use CHG as you would any other liquid soap.  You can apply chg directly  to the skin and wash                       Gently with a scrungie or clean washcloth.  5.  Apply the CHG Soap to your body ONLY FROM THE NECK DOWN.   Do not use on face/ open                           Wound or open sores. Avoid contact with eyes, ears mouth and genitals (private parts).                       Wash face,  Genitals (private parts) with your normal soap.             6.  Wash thoroughly, paying special attention to the area where your surgery  will be performed.  7.  Thoroughly rinse your body with warm water from the neck down.  8.  DO NOT shower/wash with your normal soap after using and rinsing off  the CHG Soap.                9.  Pat yourself dry with a clean towel.            10.  Wear clean pajamas.            11.  Place clean sheets on your bed the night of your first shower and do not  sleep with pets. Day of Surgery : Do not apply any lotions/deodorants the morning of surgery.  Please wear clean clothes to the hospital/surgery center.  FAILURE TO FOLLOW THESE INSTRUCTIONS MAY RESULT IN THE  CANCELLATION OF YOUR SURGERY PATIENT SIGNATURE_________________________________  NURSE SIGNATURE__________________________________  ________________________________________________________________________

## 2020-02-26 ENCOUNTER — Encounter (HOSPITAL_COMMUNITY)
Admission: RE | Admit: 2020-02-26 | Discharge: 2020-02-26 | Disposition: A | Discharge status: Home / Self Care | Payer: 59 | Source: Ambulatory Visit | Attending: General Surgery | Admitting: General Surgery

## 2020-02-26 ENCOUNTER — Encounter (HOSPITAL_COMMUNITY): Payer: Self-pay

## 2020-02-26 ENCOUNTER — Other Ambulatory Visit: Payer: Self-pay

## 2020-02-26 DIAGNOSIS — Z01812 Encounter for preprocedural laboratory examination: Secondary | ICD-10-CM | POA: Diagnosis not present

## 2020-02-26 HISTORY — DX: Acute myocardial infarction, unspecified: I21.9

## 2020-02-26 HISTORY — DX: Malignant (primary) neoplasm, unspecified: C80.1

## 2020-02-26 HISTORY — DX: Pneumonia, unspecified organism: J18.9

## 2020-02-26 LAB — BASIC METABOLIC PANEL
Anion gap: 11 (ref 5–15)
BUN: 10 mg/dL (ref 6–20)
CO2: 25 mmol/L (ref 22–32)
Calcium: 9.2 mg/dL (ref 8.9–10.3)
Chloride: 104 mmol/L (ref 98–111)
Creatinine, Ser: 0.79 mg/dL (ref 0.61–1.24)
GFR calc Af Amer: >60 mL/min (ref >60)
GFR calc non Af Amer: >60 mL/min (ref >60)
Glucose, Bld: 121 mg/dL — ABNORMAL HIGH (ref 70–99)
Potassium: 3.8 mmol/L (ref 3.5–5.1)
Sodium: 140 mmol/L (ref 135–145)

## 2020-02-26 LAB — CBC
HCT: 46.1 % (ref 39.0–52.0)
Hemoglobin: 15.1 g/dL (ref 13.0–17.0)
MCH: 30.9 pg (ref 26.0–34.0)
MCHC: 32.8 g/dL (ref 30.0–36.0)
MCV: 94.5 fL (ref 80.0–100.0)
Platelets: 233 10*3/uL (ref 150–400)
RBC: 4.88 MIL/uL (ref 4.22–5.81)
RDW: 14.1 % (ref 11.5–15.5)
WBC: 6.5 10*3/uL (ref 4.0–10.5)
nRBC: 0 % (ref 0.0–0.2)

## 2020-02-26 NOTE — Progress Notes (Signed)
COVID Vaccine Completed:yes Date COVID Vaccine completed:12/04/19 COVID vaccine manufacturer: *Pfizer    [employer]   PCP - Dr. Jeremy Johann. Cardiologist - Dr. Cherlynn Kaiser . Appt on 02/27/20  Chest x-ray - 07/2019 EKG - 10/03/19 EPIC Stress Test - 10/10/19  ECHO - 11/13/19 Cardiac Cath -   Sleep Study -  CPAP -   Fasting Blood Sugar -  Checks Blood Sugar _____ times a day  Blood Thinner Instructions:RN advised pt. To ask cardiologist tomorrow about the instructions for Brilinta and Aspirin 81 mg. Aspirin Instructions: Last Dose:  Anesthesia review: NSTEMI.CAD.  Patient denies shortness of breath, fever, cough and chest pain at PAT appointment   Patient verbalized understanding of instructions that were given to them at the PAT appointment. Patient was also instructed that they will need to review over the PAT instructions again at home before surgery.

## 2020-02-27 ENCOUNTER — Ambulatory Visit: Payer: 59 | Admitting: Internal Medicine

## 2020-02-27 ENCOUNTER — Encounter: Payer: Self-pay | Admitting: Internal Medicine

## 2020-02-27 VITALS — BP 130/80 | HR 79 | Ht 71.0 in | Wt 166.2 lb

## 2020-02-27 DIAGNOSIS — I255 Ischemic cardiomyopathy: Secondary | ICD-10-CM

## 2020-02-27 DIAGNOSIS — Z01810 Encounter for preprocedural cardiovascular examination: Secondary | ICD-10-CM | POA: Diagnosis not present

## 2020-02-27 DIAGNOSIS — I2510 Atherosclerotic heart disease of native coronary artery without angina pectoris: Secondary | ICD-10-CM | POA: Diagnosis not present

## 2020-02-27 DIAGNOSIS — E785 Hyperlipidemia, unspecified: Secondary | ICD-10-CM

## 2020-02-27 DIAGNOSIS — I214 Non-ST elevation (NSTEMI) myocardial infarction: Secondary | ICD-10-CM

## 2020-02-27 NOTE — Progress Notes (Signed)
Cardiology Office Note:    Date:  02/27/2020   ID:  Jeff Barnes, DOB 02-26-1971, MRN [id number]  PCP:  Carol Ada, MD  Cardiologist:  Elouise Munroe, MD  Electrophysiologist:  None   Referring MD: Carol Ada, MD   Chief Complaint: Preoperative cardiovascular evaluation  History of Present Illness:    Jeff Barnes is a 49 y.o. male with a history of GERD, hyperlipidemia, NSTEMI August 06, 2019 with reduced ejection fraction.  Started on heart failure therapy and EF has recovered.  He had done very well with physical activity after MI, participated in cardiac rehab.  Had one episode of atypical chest pain, stress nuclear study was negative for ischemia.  Presents today due to symptomatic inguinal hernia requiring operative management.  He is normally very physically active but is significantly limited in his activity due to symptoms of hernia.  Plans for operative management.  He has no cardiopulmonary symptoms at this time, but again is limited due to groin symptoms.  He feels he has lost exercise tolerance, weight, and energy due to inability to be active.  Has significant daily, work-related stress.  He is the interim city Clinical biochemist.  The patient denies chest pain, chest pressure, dyspnea at rest or with exertion, palpitations, PND, orthopnea, or leg swelling. Denies cough, fever, chills. Denies nausea, vomiting. Denies syncope or presyncope. Denies dizziness or lightheadedness. Denies snoring.   Past Medical History:  Diagnosis Date  . Cancer ([place])    skin cancer on left face  . Coronary artery disease   . GERD (gastroesophageal reflux disease)   . H/O scarlet fever   . Myocardial infarction [facility])    November  . Pneumonia     Past Surgical History:  Procedure Laterality Date  . BACK SURGERY    . CARDIAC CATHETERIZATION    . CORONARY STENT INTERVENTION  08/06/2019  . CORONARY STENT INTERVENTION N/A 08/06/2019   Procedure: CORONARY STENT  INTERVENTION;  Surgeon: Troy Sine, MD;  Location: [place] [facility];  Service: Cardiovascular;  Laterality: N/A;  . LEFT HEART CATH AND CORONARY ANGIOGRAPHY N/A 08/06/2019   Procedure: LEFT HEART CATH AND CORONARY ANGIOGRAPHY;  Surgeon: Troy Sine, MD;  Location: [place] [facility];  Service: Cardiovascular;  Laterality: N/A;    Current Medications: Current Meds  Medication Sig  . aspirin 81 MG chewable tablet Chew 1 tablet (81 mg total) by mouth daily.  Marland Kitchen atorvastatin (LIPITOR) 80 MG tablet Take 1 tablet (80 mg total) by mouth daily at 6 PM.  . carvedilol (COREG) 6.25 MG tablet Take 1 tablet (6.25 mg total) by mouth 2 (two) times daily with a meal.  . esomeprazole (NEXIUM) 20 MG capsule Take 20 mg by mouth daily.   Marland Kitchen Fexofenadine HCl (ALLEGRA PO) Take 180 mg by mouth daily.   . naphazoline-pheniramine (NAPHCON-A) 0.025-0.3 % ophthalmic solution Place 1 drop into both eyes 4 (four) times daily as needed for eye irritation or allergies.  . nitroGLYCERIN (NITROSTAT) 0.4 MG SL tablet Place 1 tablet (0.4 mg total) under the tongue every 5 (five) minutes x 3 doses as needed for chest pain.  . sacubitril-valsartan (ENTRESTO) 24-26 MG Take 1 tablet by mouth 2 (two) times daily.  . ticagrelor (BRILINTA) 90 MG TABS tablet Take 1 tablet (90 mg total) by mouth 2 (two) times daily.     Allergies:   Patient has no known allergies.   Social History   Socioeconomic History  . Marital status: Single  Spouse name: Not on file  . Number of children: 2  . Years of education: 16  . Highest education level: Bachelor's degree (e.g., BA, AB, BS)  Occupational History  . Not on file  Tobacco Use  . Smoking status: Never Smoker  . Smokeless tobacco: Never Used  Vaping Use  . Vaping Use: Never used  Substance and Sexual Activity  . Alcohol use: Yes    Comment: occas.  . Drug use: Never  . Sexual activity: Not on file  Other Topics Concern  . Not on file  Social History Narrative  . Not  on file   Social Determinants of Health   Financial Resource Strain:   . Difficulty of Paying Living Expenses:   Food Insecurity:   . Worried About Charity fundraiser in the Last Year:   . Arboriculturist in the Last Year:   Transportation Needs:   . Film/video editor (Medical):   Marland Kitchen Lack of Transportation (Non-Medical):   Physical Activity:   . Days of Exercise per Week:   . Minutes of Exercise per Session:   Stress:   . Feeling of Stress :   Social Connections:   . Frequency of Communication with Friends and Family:   . Frequency of Social Gatherings with Friends and Family:   . Attends Religious Services:   . Active Member of Clubs or Organizations:   . Attends Archivist Meetings:   Marland Kitchen Marital Status:      Family History: The patient's family history includes CAD in his father; Cancer in his father.  ROS:   Please see the history of present illness.    All other systems reviewed and are negative.  EKGs/Labs/Other Studies Reviewed:    The following studies were reviewed today:  EKG: Sinus rhythm, septal infarct  Recent Labs: 02/26/2020: BUN 10; Creatinine, Ser 0.79; Hemoglobin 15.1; Platelets 233; Potassium 3.8; Sodium 140  Recent Lipid Panel    Component Value Date/Time   CHOL 178 08/06/2019 0407   TRIG 90 08/06/2019 0407   HDL 39 (L) 08/06/2019 0407   CHOLHDL 4.6 08/06/2019 0407   VLDL 18 08/06/2019 0407   LDLCALC 121 (H) 08/06/2019 0407    Physical Exam:    VS:  BP 130/80   Pulse 79   Ht 5\' 11"  (1.803 m)   Wt 166 lb 3.2 oz (75.4 kg)   SpO2 98%   BMI 23.18 kg/m     Wt Readings from Last 5 Encounters:  02/27/20 166 lb 3.2 oz (75.4 kg)  02/26/20 163 lb (73.9 kg)  02/26/20 167 lb (75.8 kg)  12/12/19 166 lb 14.2 oz (75.7 kg)  11/18/19 171 lb (77.6 kg)     Constitutional: No acute distress Eyes: sclera non-icteric, normal conjunctiva and lids ENMT: normal dentition, moist mucous membranes Cardiovascular: regular rhythm, normal  rate, no murmurs. S1 and S2 normal. Radial pulses normal bilaterally. No jugular venous distention.  Respiratory: clear to auscultation bilaterally GI : normal bowel sounds, soft and nontender. No distention.   MSK: extremities warm, well perfused. No edema.  NEURO: grossly nonfocal exam, moves all extremities. PSYCH: alert and oriented x 3, normal mood and affect.   ASSESSMENT:    1. Preoperative cardiovascular examination   2. Ischemic cardiomyopathy   3. NSTEMI (non-ST elevated myocardial infarction) ([place])   4. Coronary artery disease involving native coronary artery of native heart without angina pectoris   5. Hyperlipidemia, unspecified hyperlipidemia type    PLAN:  Preoperative cardiovascular examination  -Complex situation since he has not completed 1 year of DAPT.  Nevertheless he has completed 7 months of dual antiplatelet therapy, is symptom free.  I contacted the patient's surgeon to review expected blood loss and feasibility of operating while on dual antiplatelet therapy.  We agreed that monotherapy with an antiplatelet agent would mitigate bleeding and also provide some level of protection for cardiac stent given the indication for 1 year of dual antiplatelet therapy because of ACS.  Dr. Kieth Brightly is agreeable to continuation without interruption of 1 antiplatelet agent.  I reviewed this with interventional cardiology who suggested that continuing Man[place] after surgery if agreeable to the surgical team would be optimal, though enough time has passed that brief discontinuation could also be considered if bleeding risk were felt to be high.  This is not felt to be I high risk for bleeding surgery.  Therefore I have contacted the patient and instructed him to continue Brilinta through surgery and hold his aspirin 7 days prior to procedure.  Would resume aspirin as soon as it is safe in the postoperative period.  This is to be directed by the surgical team.  No other changes to medical  therapy prior to surgery. The patient is intermediate risk for intermediate risk procedure.  No further cardiovascular testing is required prior to the procedure.  If this level of risk is acceptable to the patient and surgical team, the patient should be considered optimized from a cardiovascular standpoint.   Ischemic cardiomyopathy, recovery of EF NSTEMI (non-ST elevated myocardial infarction) ([place]) Coronary artery disease involving native coronary artery of native heart without angina pectoris -Preoperative instructions as above regarding dual antiplatelet therapy -He is doing well on medical therapy for ischemic cardiomyopathy and has had recovery of EF.  Continue carvedilol, Entresto, statin.  Hyperlipidemia, unspecified hyperlipidemia type-continue atorvastatin 80 mg daily  Total time of encounter: 32 minutes total time of encounter, including 25 minutes spent in face-to-face patient care on the date of this encounter. This time includes coordination of care and counseling regarding above mentioned problem list. Remainder of non-face-to-face time involved reviewing chart documents/testing relevant to the patient encounter and documentation in the medical record. I have independently reviewed documentation from referring provider.   Cherlynn Kaiser, MD [place]  [facility]    Medication Adjustments/Labs and Tests Ordered: Current medicines are reviewed at length with the patient today.  Concerns regarding medicines are outlined above.  No orders of the defined types were placed in this encounter.  No orders of the defined types were placed in this encounter.   There are no Patient Instructions on file for this visit.

## 2020-02-28 ENCOUNTER — Telehealth: Payer: Self-pay | Admitting: *Deleted

## 2020-02-28 ENCOUNTER — Telehealth: Payer: Self-pay | Admitting: Internal Medicine

## 2020-02-28 NOTE — Telephone Encounter (Signed)
Dr Margaretann Loveless called patient . She will like to give patient information in regards to upcoming  Surgery.      ( if patient returns call,please  Ask when will be a good time that Dr Margaretann Loveless can call him back to talk to him.)

## 2020-02-28 NOTE — Telephone Encounter (Signed)
LMTCB for patient this morning to discuss DAPT recommendations in anticipation of hernia repair on July 1. If patient returns call, please let me know a good time to reach him and I'll call back.

## 2020-03-02 ENCOUNTER — Other Ambulatory Visit (HOSPITAL_COMMUNITY)
Admission: RE | Admit: 2020-03-02 | Discharge: 2020-03-02 | Disposition: A | Discharge status: Home / Self Care | Payer: 59 | Source: Ambulatory Visit | Attending: General Surgery | Admitting: General Surgery

## 2020-03-02 DIAGNOSIS — Z01812 Encounter for preprocedural laboratory examination: Secondary | ICD-10-CM | POA: Diagnosis present

## 2020-03-02 DIAGNOSIS — Z20822 Contact with and (suspected) exposure to covid-19: Secondary | ICD-10-CM | POA: Insufficient documentation

## 2020-03-02 LAB — SARS CORONAVIRUS 2 (TAT 6-24 HRS): SARS Coronavirus 2: NEGATIVE

## 2020-03-02 NOTE — Telephone Encounter (Signed)
Dr Margaretann Loveless spoke to patient.

## 2020-03-04 NOTE — Anesthesia Preprocedure Evaluation (Addendum)
Anesthesia Evaluation  Patient identified by MRN, date of birth, ID band Patient awake    Reviewed: Allergy & Precautions, NPO status , Patient's Chart, lab work & pertinent test results, reviewed documented beta blocker date and time   Airway Mallampati: I  TM Distance: >3 FB Neck ROM: Full    Dental no notable dental hx. (+) Teeth Intact, Dental Advisory Given   Pulmonary neg pulmonary ROS,    Pulmonary exam normal breath sounds clear to auscultation       Cardiovascular hypertension, Pt. on medications and Pt. on home beta blockers + CAD and + Past MI  Normal cardiovascular exam Rhythm:Regular Rate:Normal  S/p DES x 2 08/2019- on DAPT  Echo 3/20201: 1. Left ventricular ejection fraction, by estimation, is 60 to 65%. The  left ventricle has normal function. The left ventricle has no regional  wall motion abnormalities. Left ventricular diastolic parameters were  normal.  2. Right ventricular systolic function is normal. The right ventricular  size is normal.  3. The mitral valve is normal in structure. No evidence of mitral valve  regurgitation. No evidence of mitral stenosis.  4. The aortic valve is normal in structure. Aortic valve regurgitation is  not visualized. No aortic stenosis is present.    Took brillinta last night   Neuro/Psych negative neurological ROS  negative psych ROS   GI/Hepatic Neg liver ROS, GERD  Medicated and Controlled,  Endo/Other  negative endocrine ROS  Renal/GU negative Renal ROS  negative genitourinary   Musculoskeletal negative musculoskeletal ROS (+)   Abdominal Normal abdominal exam  (+)   Peds  Hematology negative hematology ROS (+)   Anesthesia Other Findings Right inguinal hernia   Reproductive/Obstetrics negative OB ROS                           Anesthesia Physical Anesthesia Plan  ASA: III  Anesthesia Plan: General and Regional    Post-op Pain Management: GA combined w/ Regional for post-op pain   Induction: Intravenous  PONV Risk Score and Plan: 3 and Ondansetron, Dexamethasone, Midazolam and Treatment may vary due to age or medical condition  Airway Management Planned: Oral ETT  Additional Equipment: None  Intra-op Plan:   Post-operative Plan: Extubation in OR  Informed Consent: I have reviewed the patients History and Physical, chart, labs and discussed the procedure including the risks, benefits and alternatives for the proposed anesthesia with the patient or authorized representative who has indicated his/her understanding and acceptance.     Dental advisory given  Plan Discussed with: CRNA  Anesthesia Plan Comments: (TAP block per surgeon request )       Anesthesia Quick Evaluation

## 2020-03-05 ENCOUNTER — Ambulatory Visit (HOSPITAL_COMMUNITY): Payer: 59 | Admitting: Anesthesiology

## 2020-03-05 ENCOUNTER — Encounter (HOSPITAL_COMMUNITY): Payer: Self-pay | Admitting: General Surgery

## 2020-03-05 ENCOUNTER — Other Ambulatory Visit: Payer: Self-pay

## 2020-03-05 ENCOUNTER — Observation Stay (HOSPITAL_COMMUNITY)
Admission: AD | Admit: 2020-03-05 | Discharge: 2020-03-06 | Disposition: A | Discharge status: Home / Self Care | Payer: 59 | Source: Ambulatory Visit | Attending: General Surgery | Admitting: General Surgery

## 2020-03-05 ENCOUNTER — Encounter (HOSPITAL_COMMUNITY)
Admission: AD | Disposition: A | Discharge status: Home / Self Care | Payer: Self-pay | Source: Ambulatory Visit | Attending: General Surgery

## 2020-03-05 DIAGNOSIS — K4090 Unilateral inguinal hernia, without obstruction or gangrene, not specified as recurrent: Principal | ICD-10-CM | POA: Insufficient documentation

## 2020-03-05 DIAGNOSIS — Z85828 Personal history of other malignant neoplasm of skin: Secondary | ICD-10-CM | POA: Insufficient documentation

## 2020-03-05 DIAGNOSIS — Z79899 Other long term (current) drug therapy: Secondary | ICD-10-CM | POA: Diagnosis not present

## 2020-03-05 DIAGNOSIS — Z7982 Long term (current) use of aspirin: Secondary | ICD-10-CM | POA: Diagnosis not present

## 2020-03-05 DIAGNOSIS — K219 Gastro-esophageal reflux disease without esophagitis: Secondary | ICD-10-CM | POA: Diagnosis not present

## 2020-03-05 DIAGNOSIS — Z8249 Family history of ischemic heart disease and other diseases of the circulatory system: Secondary | ICD-10-CM | POA: Diagnosis not present

## 2020-03-05 DIAGNOSIS — I2510 Atherosclerotic heart disease of native coronary artery without angina pectoris: Secondary | ICD-10-CM | POA: Insufficient documentation

## 2020-03-05 DIAGNOSIS — I252 Old myocardial infarction: Secondary | ICD-10-CM | POA: Diagnosis not present

## 2020-03-05 DIAGNOSIS — Z955 Presence of coronary angioplasty implant and graft: Secondary | ICD-10-CM | POA: Insufficient documentation

## 2020-03-05 HISTORY — PX: INGUINAL HERNIA REPAIR: SHX194

## 2020-03-05 LAB — CBC
HCT: 43.4 % (ref 39.0–52.0)
Hemoglobin: 14.4 g/dL (ref 13.0–17.0)
MCH: 30.8 pg (ref 26.0–34.0)
MCHC: 33.2 g/dL (ref 30.0–36.0)
MCV: 92.9 fL (ref 80.0–100.0)
Platelets: 186 10*3/uL (ref 150–400)
RBC: 4.67 MIL/uL (ref 4.22–5.81)
RDW: 13.8 % (ref 11.5–15.5)
WBC: 10.7 10*3/uL — ABNORMAL HIGH (ref 4.0–10.5)
nRBC: 0 % (ref 0.0–0.2)

## 2020-03-05 SURGERY — REPAIR, HERNIA, INGUINAL, ADULT
Anesthesia: Regional | Site: Inguinal | Laterality: Right

## 2020-03-05 MED ORDER — CHLORHEXIDINE GLUCONATE CLOTH 2 % EX PADS: 6.0000 | MEDICATED_PAD | Freq: Once | CUTANEOUS | Status: DC

## 2020-03-05 MED ORDER — DEXAMETHASONE SODIUM PHOSPHATE 10 MG/ML IJ SOLN
INTRAMUSCULAR | Status: DC | PRN
  Administered 2020-03-05: 5 mg via INTRAVENOUS

## 2020-03-05 MED ORDER — ONDANSETRON 4 MG PO TBDP: 4.0000 mg | ORAL_TABLET | Freq: Four times a day (QID) | ORAL | Status: DC | PRN

## 2020-03-05 MED ORDER — POLYETHYLENE GLYCOL 3350 17 G PO PACK: 17.0000 g | PACK | Freq: Every day | ORAL | Status: DC | PRN

## 2020-03-05 MED ORDER — ROCURONIUM BROMIDE 10 MG/ML (PF) SYRINGE
PREFILLED_SYRINGE | INTRAVENOUS | Status: DC | PRN
  Administered 2020-03-05: 60 mg via INTRAVENOUS
  Administered 2020-03-05: 10 mg via INTRAVENOUS

## 2020-03-05 MED ORDER — FENTANYL CITRATE (PF) 100 MCG/2ML IJ SOLN
50.0000 ug | INTRAMUSCULAR | Status: DC
  Administered 2020-03-05: 100 ug via INTRAVENOUS
  Filled 2020-03-05: qty 2

## 2020-03-05 MED ORDER — PROMETHAZINE HCL 25 MG/ML IJ SOLN: 6.2500 mg | INTRAMUSCULAR | Status: DC | PRN

## 2020-03-05 MED ORDER — 0.9 % SODIUM CHLORIDE (POUR BTL) OPTIME
TOPICAL | Status: DC | PRN
  Administered 2020-03-05: 1000 mL

## 2020-03-05 MED ORDER — SUGAMMADEX SODIUM 200 MG/2ML IV SOLN
INTRAVENOUS | Status: DC | PRN
  Administered 2020-03-05: 200 mg via INTRAVENOUS

## 2020-03-05 MED ORDER — LIDOCAINE HCL 2 % IJ SOLN
INTRAMUSCULAR | Status: AC
  Filled 2020-03-05: qty 20

## 2020-03-05 MED ORDER — GABAPENTIN 300 MG PO CAPS
300.0000 mg | ORAL_CAPSULE | ORAL | Status: AC
  Administered 2020-03-05: 300 mg via ORAL
  Filled 2020-03-05: qty 1

## 2020-03-05 MED ORDER — PHENYLEPHRINE HCL (PRESSORS) 10 MG/ML IV SOLN
INTRAVENOUS | Status: DC | PRN
Start: 2020-03-05 — End: 2020-03-05
  Administered 2020-03-05 (×5): 40 ug via INTRAVENOUS
  Administered 2020-03-05: 80 ug via INTRAVENOUS

## 2020-03-05 MED ORDER — TICAGRELOR 90 MG PO TABS
90.0000 mg | ORAL_TABLET | Freq: Two times a day (BID) | ORAL | Status: DC
  Administered 2020-03-05: 90 mg via ORAL
  Filled 2020-03-05 (×2): qty 1

## 2020-03-05 MED ORDER — PHENYLEPHRINE 40 MCG/ML (10ML) SYRINGE FOR IV PUSH (FOR BLOOD PRESSURE SUPPORT)
PREFILLED_SYRINGE | INTRAVENOUS | Status: AC
  Filled 2020-03-05: qty 10

## 2020-03-05 MED ORDER — OXYCODONE HCL 5 MG PO TABS: 5.0000 mg | ORAL_TABLET | Freq: Once | ORAL | Status: DC | PRN

## 2020-03-05 MED ORDER — METOPROLOL TARTRATE 5 MG/5ML IV SOLN: 5.0000 mg | Freq: Four times a day (QID) | INTRAVENOUS | Status: DC | PRN

## 2020-03-05 MED ORDER — MIDAZOLAM HCL 2 MG/2ML IJ SOLN
INTRAMUSCULAR | Status: AC
  Filled 2020-03-05: qty 2

## 2020-03-05 MED ORDER — ROPIVACAINE HCL 5 MG/ML IJ SOLN
INTRAMUSCULAR | Status: DC | PRN
Start: 2020-03-05 — End: 2020-03-05
  Administered 2020-03-05: 30 mL via PERINEURAL

## 2020-03-05 MED ORDER — CHLORHEXIDINE GLUCONATE 0.12 % MT SOLN
15.0000 mL | Freq: Once | OROMUCOSAL | Status: AC
  Administered 2020-03-05: 15 mL via OROMUCOSAL

## 2020-03-05 MED ORDER — MORPHINE SULFATE (PF) 2 MG/ML IV SOLN: 2.0000 mg | INTRAVENOUS | Status: DC | PRN

## 2020-03-05 MED ORDER — NAPHAZOLINE-PHENIRAMINE 0.025-0.3 % OP SOLN: 1.0000 [drp] | Freq: Four times a day (QID) | OPHTHALMIC | Status: DC | PRN

## 2020-03-05 MED ORDER — HYDROMORPHONE HCL 1 MG/ML IJ SOLN: 0.2500 mg | INTRAMUSCULAR | Status: DC | PRN

## 2020-03-05 MED ORDER — LIDOCAINE 2% (20 MG/ML) 5 ML SYRINGE
INTRAMUSCULAR | Status: AC
  Filled 2020-03-05: qty 5

## 2020-03-05 MED ORDER — KETAMINE HCL 10 MG/ML IJ SOLN
INTRAMUSCULAR | Status: DC | PRN
  Administered 2020-03-05: 30 mg via INTRAVENOUS

## 2020-03-05 MED ORDER — ORAL CARE MOUTH RINSE: 15.0000 mL | Freq: Once | OROMUCOSAL | Status: AC

## 2020-03-05 MED ORDER — FENTANYL CITRATE (PF) 250 MCG/5ML IJ SOLN
INTRAMUSCULAR | Status: DC | PRN
  Administered 2020-03-05: 100 ug via INTRAVENOUS
  Administered 2020-03-05: 50 ug via INTRAVENOUS

## 2020-03-05 MED ORDER — PROPOFOL 10 MG/ML IV BOLUS
INTRAVENOUS | Status: AC
  Filled 2020-03-05: qty 20

## 2020-03-05 MED ORDER — LIDOCAINE 2% (20 MG/ML) 5 ML SYRINGE
INTRAMUSCULAR | Status: DC | PRN
  Administered 2020-03-05: 40 mg via INTRAVENOUS

## 2020-03-05 MED ORDER — CEFAZOLIN SODIUM-DEXTROSE 2-4 GM/100ML-% IV SOLN
2.0000 g | INTRAVENOUS | Status: AC
  Administered 2020-03-05: 2 g via INTRAVENOUS
  Filled 2020-03-05: qty 100

## 2020-03-05 MED ORDER — OXYCODONE HCL 5 MG/5ML PO SOLN: 5.0000 mg | Freq: Once | ORAL | Status: DC | PRN

## 2020-03-05 MED ORDER — CARVEDILOL 6.25 MG PO TABS
6.2500 mg | ORAL_TABLET | Freq: Two times a day (BID) | ORAL | Status: DC
  Administered 2020-03-05: 6.25 mg via ORAL
  Filled 2020-03-05: qty 1

## 2020-03-05 MED ORDER — KCL IN DEXTROSE-NACL 20-5-0.45 MEQ/L-%-% IV SOLN
INTRAVENOUS | Status: DC
  Filled 2020-03-05 (×2): qty 1000

## 2020-03-05 MED ORDER — SODIUM CHLORIDE (PF) 0.9 % IJ SOLN
INTRAMUSCULAR | Status: AC
  Filled 2020-03-05: qty 10

## 2020-03-05 MED ORDER — ACETAMINOPHEN 500 MG PO TABS
1000.0000 mg | ORAL_TABLET | Freq: Once | ORAL | Status: DC
  Filled 2020-03-05: qty 2

## 2020-03-05 MED ORDER — NITROGLYCERIN 0.4 MG SL SUBL: 0.4000 mg | SUBLINGUAL_TABLET | SUBLINGUAL | Status: DC | PRN

## 2020-03-05 MED ORDER — SACUBITRIL-VALSARTAN 24-26 MG PO TABS
1.0000 | ORAL_TABLET | Freq: Two times a day (BID) | ORAL | Status: DC
  Administered 2020-03-05: 1 via ORAL
  Filled 2020-03-05 (×2): qty 1

## 2020-03-05 MED ORDER — ONDANSETRON HCL 4 MG/2ML IJ SOLN
INTRAMUSCULAR | Status: DC | PRN
  Administered 2020-03-05: 4 mg via INTRAVENOUS

## 2020-03-05 MED ORDER — PROPOFOL 10 MG/ML IV BOLUS
INTRAVENOUS | Status: DC | PRN
  Administered 2020-03-05: 200 mg via INTRAVENOUS

## 2020-03-05 MED ORDER — ACETAMINOPHEN 500 MG PO TABS
1000.0000 mg | ORAL_TABLET | ORAL | Status: AC
  Administered 2020-03-05: 1000 mg via ORAL
  Filled 2020-03-05: qty 2

## 2020-03-05 MED ORDER — ACETAMINOPHEN 650 MG RE SUPP: 650.0000 mg | Freq: Four times a day (QID) | RECTAL | Status: DC | PRN

## 2020-03-05 MED ORDER — BUPIVACAINE HCL 0.25 % IJ SOLN
INTRAMUSCULAR | Status: AC
  Filled 2020-03-05: qty 1

## 2020-03-05 MED ORDER — FENTANYL CITRATE (PF) 250 MCG/5ML IJ SOLN
INTRAMUSCULAR | Status: AC
  Filled 2020-03-05: qty 5

## 2020-03-05 MED ORDER — MIDAZOLAM HCL 2 MG/2ML IJ SOLN
1.0000 mg | INTRAMUSCULAR | Status: DC
  Administered 2020-03-05: 2 mg via INTRAVENOUS
  Filled 2020-03-05: qty 2

## 2020-03-05 MED ORDER — OXYCODONE HCL 5 MG PO TABS: 5.0000 mg | ORAL_TABLET | Freq: Four times a day (QID) | ORAL | Status: DC | PRN

## 2020-03-05 MED ORDER — DEXAMETHASONE SODIUM PHOSPHATE 10 MG/ML IJ SOLN
INTRAMUSCULAR | Status: DC | PRN
  Administered 2020-03-05: 10 mg

## 2020-03-05 MED ORDER — KETOROLAC TROMETHAMINE 15 MG/ML IJ SOLN
15.0000 mg | INTRAMUSCULAR | Status: AC
  Administered 2020-03-05: 15 mg via INTRAVENOUS
  Filled 2020-03-05: qty 1

## 2020-03-05 MED ORDER — ATORVASTATIN CALCIUM 40 MG PO TABS
80.0000 mg | ORAL_TABLET | Freq: Every day | ORAL | Status: DC
  Administered 2020-03-05: 80 mg via ORAL
  Filled 2020-03-05: qty 2

## 2020-03-05 MED ORDER — ACETAMINOPHEN 325 MG PO TABS
650.0000 mg | ORAL_TABLET | Freq: Four times a day (QID) | ORAL | Status: DC | PRN
  Administered 2020-03-05 – 2020-03-06 (×2): 650 mg via ORAL
  Filled 2020-03-05 (×2): qty 2

## 2020-03-05 MED ORDER — ONDANSETRON HCL 4 MG/2ML IJ SOLN: 4.0000 mg | Freq: Four times a day (QID) | INTRAMUSCULAR | Status: DC | PRN

## 2020-03-05 MED ORDER — LORATADINE 10 MG PO TABS
10.0000 mg | ORAL_TABLET | Freq: Every day | ORAL | Status: DC
  Filled 2020-03-05: qty 1

## 2020-03-05 MED ORDER — FENTANYL CITRATE (PF) 100 MCG/2ML IJ SOLN
INTRAMUSCULAR | Status: AC
  Filled 2020-03-05: qty 2

## 2020-03-05 MED ORDER — LACTATED RINGERS IV SOLN: INTRAVENOUS | Status: DC

## 2020-03-05 MED ORDER — PANTOPRAZOLE SODIUM 40 MG PO TBEC: 40.0000 mg | DELAYED_RELEASE_TABLET | Freq: Every day | ORAL | Status: DC

## 2020-03-05 MED ORDER — ROCURONIUM BROMIDE 10 MG/ML (PF) SYRINGE
PREFILLED_SYRINGE | INTRAVENOUS | Status: AC
  Filled 2020-03-05: qty 10

## 2020-03-05 MED ORDER — ASPIRIN 81 MG PO CHEW
81.0000 mg | CHEWABLE_TABLET | Freq: Every day | ORAL | Status: DC
  Administered 2020-03-05: 81 mg via ORAL
  Filled 2020-03-05: qty 1

## 2020-03-05 SURGICAL SUPPLY — 50 items
ADH SKN CLS APL DERMABOND .7 (GAUZE/BANDAGES/DRESSINGS) ×1
APL PRP STRL LF DISP 70% ISPRP (MISCELLANEOUS) ×1
APL SKNCLS STERI-STRIP NONHPOA (GAUZE/BANDAGES/DRESSINGS)
BENZOIN TINCTURE PRP APPL 2/3 (GAUZE/BANDAGES/DRESSINGS) ×1 IMPLANT
BLADE SURG 15 STRL LF DISP TIS (BLADE) ×1 IMPLANT
BLADE SURG 15 STRL SS (BLADE) ×2
CELLS DAT CNTRL 66122 CELL SVR (MISCELLANEOUS) IMPLANT
CHLORAPREP W/TINT 26 (MISCELLANEOUS) ×2 IMPLANT
COVER SURGICAL LIGHT HANDLE (MISCELLANEOUS) ×2 IMPLANT
COVER WAND RF STERILE (DRAPES) ×1 IMPLANT
DECANTER SPIKE VIAL GLASS SM (MISCELLANEOUS) ×2 IMPLANT
DERMABOND ADVANCED (GAUZE/BANDAGES/DRESSINGS) ×1
DERMABOND ADVANCED .7 DNX12 (GAUZE/BANDAGES/DRESSINGS) IMPLANT
DRAIN PENROSE 0.5X18 (DRAIN) ×1 IMPLANT
DRAPE LAPAROTOMY TRNSV 102X78 (DRAPES) ×2 IMPLANT
DRSG TEGADERM 4X4.75 (GAUZE/BANDAGES/DRESSINGS) IMPLANT
DRSG TELFA PLUS 4X6 ADH ISLAND (GAUZE/BANDAGES/DRESSINGS) ×1 IMPLANT
ELECT REM PT RETURN 15FT ADLT (MISCELLANEOUS) ×2 IMPLANT
GAUZE SPONGE 4X4 12PLY STRL (GAUZE/BANDAGES/DRESSINGS) IMPLANT
GLOVE BIOGEL PI IND STRL 7.0 (GLOVE) IMPLANT
GLOVE BIOGEL PI INDICATOR 7.0 (GLOVE)
GLOVE SURG SS PI 7.0 STRL IVOR (GLOVE) ×2 IMPLANT
GOWN STRL REUS W/TWL LRG LVL3 (GOWN DISPOSABLE) ×2 IMPLANT
GOWN STRL REUS W/TWL XL LVL3 (GOWN DISPOSABLE) ×2 IMPLANT
KIT BASIN (CUSTOM PROCEDURE TRAY) ×2 IMPLANT
KIT TURNOVER KIT A (KITS) IMPLANT
MESH HERNIA 3X6 (Mesh General) ×1 IMPLANT
NEEDLE HYPO 22GX1.5 SAFETY (NEEDLE) ×2 IMPLANT
PACK BASIC VI WITH GOWN DISP (CUSTOM PROCEDURE TRAY) ×2 IMPLANT
PENCIL SMOKE EVACUATOR (MISCELLANEOUS) ×1 IMPLANT
RETRACTOR WND ALEXIS 18 MED (MISCELLANEOUS) IMPLANT
RETRACTOR WND ALEXIS 25 LRG (MISCELLANEOUS) IMPLANT
RTRCTR WOUND ALEXIS 18CM MED (MISCELLANEOUS)
RTRCTR WOUND ALEXIS 25CM LRG (MISCELLANEOUS)
SPONGE LAP 18X18 RF (DISPOSABLE) ×1 IMPLANT
SPONGE LAP 4X18 RFD (DISPOSABLE) ×1 IMPLANT
STRIP CLOSURE SKIN 1/2X4 (GAUZE/BANDAGES/DRESSINGS) IMPLANT
SUT MNCRL AB 4-0 PS2 18 (SUTURE) ×2 IMPLANT
SUT PDS AB 2-0 CT2 27 (SUTURE) ×1 IMPLANT
SUT PROLENE 2 0 CT2 30 (SUTURE) ×4 IMPLANT
SUT SILK 2 0 SH CR/8 (SUTURE) IMPLANT
SUT VIC AB 2-0 CT1 27 (SUTURE) ×2
SUT VIC AB 2-0 CT1 TAPERPNT 27 (SUTURE) ×1 IMPLANT
SUT VIC AB 3-0 SH 27 (SUTURE) ×4
SUT VIC AB 3-0 SH 27XBRD (SUTURE) ×2 IMPLANT
SYR BULB IRRIG 60ML STRL (SYRINGE) ×2 IMPLANT
SYR CONTROL 10ML LL (SYRINGE) ×2 IMPLANT
TOWEL OR 17X26 10 PK STRL BLUE (TOWEL DISPOSABLE) ×2 IMPLANT
TOWEL OR NON WOVEN STRL DISP B (DISPOSABLE) ×2 IMPLANT
YANKAUER SUCT BULB TIP 10FT TU (MISCELLANEOUS) ×2 IMPLANT

## 2020-03-05 NOTE — Plan of Care (Signed)
  Problem: Education: Goal: Knowledge of General Education information will improve Description: Including pain rating scale, medication(s)/side effects and non-pharmacologic comfort measures Outcome: Progressing   Problem: Activity: Goal: Risk for activity intolerance will decrease Outcome: Progressing   Problem: Nutrition: Goal: Adequate nutrition will be maintained Outcome: Progressing   Problem: Elimination: Goal: Will not experience complications related to urinary retention Outcome: Progressing   Problem: Pain Managment: Goal: General experience of comfort will improve Outcome: Progressing   

## 2020-03-05 NOTE — Anesthesia Postprocedure Evaluation (Signed)
Anesthesia Post Note  Patient: Jeff Barnes  Procedure(s) Performed: RIGHT OPEN INGUINAL HERNIA REPAIR WITH MESH (Right Inguinal)     Patient location during evaluation: PACU Anesthesia Type: Regional and General Level of consciousness: awake and alert, oriented and patient cooperative Pain management: pain level controlled Vital Signs Assessment: post-procedure vital signs reviewed and stable Respiratory status: spontaneous breathing, nonlabored ventilation and respiratory function stable Cardiovascular status: blood pressure returned to baseline and stable Postop Assessment: no apparent nausea or vomiting Anesthetic complications: no   No complications documented.  Last Vitals:  Vitals:   03/05/20 1430 03/05/20 1451  BP: 110/79 125/80  Pulse: 78 81  Resp: 14 16  Temp: (!) 36.4 C 36.7 C  SpO2: 97% 100%    Last Pain:  Vitals:   03/05/20 1451  TempSrc: Oral  PainSc: [place]

## 2020-03-05 NOTE — Progress Notes (Signed)
Assisted Dr. Criss Rosales with right, ultrasound guided, transabdominal plane block. Side rails up, monitors on throughout procedure. See vital signs in flow sheet. Tolerated Procedure well.

## 2020-03-05 NOTE — Anesthesia Procedure Notes (Signed)
Anesthesia Regional Block: TAP block   Pre-Anesthetic Checklist: ,, timeout performed, Correct Patient, Correct Site, Correct Laterality, Correct Procedure, Correct Position, site marked, Risks and benefits discussed,  Surgical consent,  Pre-op evaluation,  At surgeon's request and post-op pain management  Laterality: Right  Prep: Maximum Sterile Barrier Precautions used, chloraprep       Needles:  Injection technique: Single-shot  Needle Type: Echogenic Stimulator Needle     Needle Length: 9cm  Needle Gauge: 22     Additional Needles:   Procedures:,,,, ultrasound used (permanent image in chart),,,,  Narrative:  Start time: 03/05/2020 11:10 AM End time: 03/05/2020 11:16 AM Injection made incrementally with aspirations every 5 mL.  Performed by: Personally  Anesthesiologist: Pervis Hocking, DO  Additional Notes: Monitors applied. No increased pain on injection. No increased resistance to injection. Injection made in 5cc increments. Good needle visualization. Patient tolerated procedure well.

## 2020-03-05 NOTE — H&P (Signed)
Jeff Barnes is an 49 y.o. male.   Chief Complaint: right inguinal hernia HPI: 49 yo male with daily symptomatic right inguinal hernia. He has had difficulty with daily activities due to the hernia. He had a MI in 08/2019 with stent and has continued his brilinita due to that issue.  Past Medical History:  Diagnosis Date  . Cancer ([place])    skin cancer on left face  . Coronary artery disease   . GERD (gastroesophageal reflux disease)   . H/O scarlet fever   . Myocardial infarction [facility])    November  . Pneumonia     Past Surgical History:  Procedure Laterality Date  . BACK SURGERY    . CARDIAC CATHETERIZATION    . CORONARY STENT INTERVENTION  08/06/2019  . CORONARY STENT INTERVENTION N/A 08/06/2019   Procedure: CORONARY STENT INTERVENTION;  Surgeon: Troy Sine, MD;  Location: [place] [facility];  Service: Cardiovascular;  Laterality: N/A;  . LEFT HEART CATH AND CORONARY ANGIOGRAPHY N/A 08/06/2019   Procedure: LEFT HEART CATH AND CORONARY ANGIOGRAPHY;  Surgeon: Troy Sine, MD;  Location: [place] [facility];  Service: Cardiovascular;  Laterality: N/A;    Family History  Problem Relation Age of Onset  . Cancer Father   . CAD Father    Social History:  reports that he has never smoked. He has never used smokeless tobacco. He reports current alcohol use. He reports that he does not use drugs.  Allergies: No Known Allergies  Medications Prior to Admission  Medication Sig Dispense Refill  . aspirin 81 MG chewable tablet Chew 1 tablet (81 mg total) by mouth daily. 90 tablet 3  . atorvastatin (LIPITOR) 80 MG tablet Take 1 tablet (80 mg total) by mouth daily at 6 PM. 90 tablet 3  . carvedilol (COREG) 6.25 MG tablet Take 1 tablet (6.25 mg total) by mouth 2 (two) times daily with a meal. 180 tablet 3  . esomeprazole (NEXIUM) 20 MG capsule Take 20 mg by mouth daily.     Marland Kitchen Fexofenadine HCl (ALLEGRA PO) Take 180 mg by mouth daily.     . naphazoline-pheniramine (NAPHCON-A)  0.025-0.3 % ophthalmic solution Place 1 drop into both eyes 4 (four) times daily as needed for eye irritation or allergies.    . sacubitril-valsartan (ENTRESTO) 24-26 MG Take 1 tablet by mouth 2 (two) times daily. 180 tablet 3  . ticagrelor (BRILINTA) 90 MG TABS tablet Take 1 tablet (90 mg total) by mouth 2 (two) times daily. 180 tablet 3  . nitroGLYCERIN (NITROSTAT) 0.4 MG SL tablet Place 1 tablet (0.4 mg total) under the tongue every 5 (five) minutes x 3 doses as needed for chest pain. 25 tablet 1    No results found for this or any previous visit (from the past 48 hour(s)). No results found.  Review of Systems  Constitutional: Negative for chills and fever.  HENT: Negative for hearing loss.   Respiratory: Negative for cough.   Cardiovascular: Negative for chest pain and palpitations.  Gastrointestinal: Negative for abdominal pain, nausea and vomiting.  Genitourinary: Negative for dysuria and urgency.  Musculoskeletal: Negative for myalgias and neck pain.  Skin: Negative for rash.  Neurological: Negative for dizziness and headaches.  Hematological: Does not bruise/bleed easily.  Psychiatric/Behavioral: Negative for suicidal ideas.    Blood pressure 117/86, pulse 70, temperature 97.8 F (36.6 C), temperature source Oral, resp. rate 12, height 5\' 11"  (1.803 m), weight 75.4 kg, SpO2 98 %. Physical Exam Vitals reviewed.  Constitutional:      Appearance: He is well-developed.  HENT:     Head: Normocephalic and atraumatic.  Eyes:     Conjunctiva/sclera: Conjunctivae normal.     Pupils: Pupils are equal, round, and reactive to light.  Cardiovascular:     Rate and Rhythm: Normal rate and regular rhythm.  Pulmonary:     Effort: Pulmonary effort is normal.     Breath sounds: Normal breath sounds.  Abdominal:     General: Bowel sounds are normal. There is no distension.     Palpations: Abdomen is soft.     Tenderness: There is no abdominal tenderness.     Comments: Right inguinal  hernia  Musculoskeletal:        General: Normal range of motion.     Cervical back: Normal range of motion and neck supple.  Skin:    General: Skin is warm and dry.  Neurological:     Mental Status: He is alert and oriented to person, place, and time.  Psychiatric:        Behavior: Behavior normal.      Assessment/Plan 49 yo male with symptomatic right inguinal hernia -open right inguinal hernia repair with mesh -observation stay due to antiplatelet therapy -ERAS protocol  Mickeal Skinner, MD 03/05/2020, 11:31 AM

## 2020-03-05 NOTE — Transfer of Care (Signed)
Immediate Anesthesia Transfer of Care Note  Patient: ULICES MAACK  Procedure(s) Performed: RIGHT OPEN INGUINAL HERNIA REPAIR WITH MESH (Right Inguinal)  Patient Location: PACU  Anesthesia Type:General  Level of Consciousness: awake, alert  and oriented  Airway & Oxygen Therapy: Patient Spontanous Breathing and Patient connected to face mask oxygen  Post-op Assessment: Report given to RN and Post -op Vital signs reviewed and stable  Post vital signs: Reviewed and stable  Last Vitals:  Vitals Value Taken Time  BP 106/91 03/05/20 1330  Temp 36.6 C 03/05/20 1330  Pulse 85 03/05/20 1330  Resp 12 03/05/20 1330  SpO2 100 % 03/05/20 1330  Vitals shown include unvalidated device data.  Last Pain:  Vitals:   03/05/20 1130  TempSrc:   PainSc: 0-No pain      Patients Stated Pain Goal: 3 (14/23/95 3202)  Complications: No complications documented.

## 2020-03-05 NOTE — Anesthesia Procedure Notes (Signed)
Procedure Name: Intubation Date/Time: 03/05/2020 12:08 PM Performed by: Maxwell Caul, CRNA Pre-anesthesia Checklist: Patient identified, Emergency Drugs available, Suction available and Patient being monitored Patient Re-evaluated:Patient Re-evaluated prior to induction Oxygen Delivery Method: Circle system utilized Preoxygenation: Pre-oxygenation with 100% oxygen Induction Type: IV induction Ventilation: Mask ventilation without difficulty Laryngoscope Size: Mac and 4 Grade View: Grade I Tube type: Oral Tube size: 7.5 mm Number of attempts: 1 Airway Equipment and Method: Stylet Placement Confirmation: ETT inserted through vocal cords under direct vision,  positive ETCO2 and breath sounds checked- equal and bilateral Secured at: 21 cm Tube secured with: Tape Dental Injury: Teeth and Oropharynx as per pre-operative assessment

## 2020-03-05 NOTE — Op Note (Signed)
Preop diagnosis: right inguinal hernia  Postop diagnosis: right direct inguinal hernia  Procedure: open Right inguinal hernia repair with mesh  Surgeon: Gurney Maxin, M.D.  Asst: none  Anesthesia: Gen.   Indications for procedure: Jeff Barnes is a 49 y.o. male with symptoms of pain and enlarging Right inguinal hernia(s). After discussing risks, alternatives and benefits he decided on open repair and was brought to day surgery for repair.  Description of procedure: The patient was brought into the operative suite, placed supine. Anesthesia was administered with endotracheal tube. Patient was strapped in place. The patient was prepped and draped in the usual sterile fashion.  The anterior superior iliac spine and pubic tubercle were identified on the Right side. An incision was made 1cm above the connecting line, representative of the location of the inguinal ligament. The subcutaneous tissue was bluntly dissected, scarpa's fascia was dissected away. The external abdominal oblique fascia was identified and sharply opened down to the external inguinal ring. The conjoint tendon and inguinal ligament were identified. The cord structures and sac were dissected free of the surrounding tissue in 360 degrees. A penrose drain was used to encircle the contents. The cremasteric fibers were dissected free of the contents of the cord and hernia sac. The cord structures (vessels and vas deferens) were identified and carefully dissected away from the hernia sac. The hernia was direct and reduced. The hernia sac was dissected down to the internal inguinal ring. Preperitoneal fat was identified showing appropriate dissection. There was a small lipoma along the cord that was dissected free with cautery. The sac was then reduced into the preperitoneal space. A 3x6 Bard mesh was then used to close the defect and reinforce the floor. The mesh was sutured to the lacunar ligament and inguinal ligament using a 2-0  prolene in running fashion. Next the superior edge of the mesh was sutured to the conjoined tendon using a 2-0 interrupted Prolene. An additional 2-0 Prolene was used to suture the tail ends of the mesh together re-creating the deep ring. Cord structures are running in a neutral position through the mesh. Next the external abdominal oblique fascia was closed with a 2-0 Vicryl in interrupted fashion to re-create the external inguinal ring. Scarpa's fascia was closed with 3-0 Vicryl in running fashion. Skin was closed with a 4-0 Monocryl subcuticular stitch in running fashion. Dermabond place for dressing. Patient woke from anesthesia and brought to PACU in stable condition. All counts are correct.  Findings: right direct inguinal hernia  Specimen: none  Blood loss: 20 ml  Local anesthesia: none  Complications: none  Implant: 3 x 6 in Bard mesh  Gurney Maxin, M.D. General, Bariatric, & Minimally Invasive Surgery [facility] 1:15 PM 03/05/2020

## 2020-03-06 ENCOUNTER — Encounter (HOSPITAL_COMMUNITY): Payer: Self-pay | Admitting: General Surgery

## 2020-03-06 DIAGNOSIS — K4090 Unilateral inguinal hernia, without obstruction or gangrene, not specified as recurrent: Secondary | ICD-10-CM | POA: Diagnosis not present

## 2020-03-06 LAB — CBC
HCT: 40.8 % (ref 39.0–52.0)
Hemoglobin: 13.6 g/dL (ref 13.0–17.0)
MCH: 30.9 pg (ref 26.0–34.0)
MCHC: 33.3 g/dL (ref 30.0–36.0)
MCV: 92.7 fL (ref 80.0–100.0)
Platelets: 202 10*3/uL (ref 150–400)
RBC: 4.4 MIL/uL (ref 4.22–5.81)
RDW: 13.7 % (ref 11.5–15.5)
WBC: 13.5 10*3/uL — ABNORMAL HIGH (ref 4.0–10.5)
nRBC: 0 % (ref 0.0–0.2)

## 2020-03-06 MED ORDER — OXYCODONE HCL 5 MG PO TABS
5.0000 mg | ORAL_TABLET | Freq: Four times a day (QID) | ORAL | 0 refills | Status: DC | PRN
Start: 2020-03-06 — End: 2020-06-22

## 2020-03-06 MED ORDER — IBUPROFEN 800 MG PO TABS
800.0000 mg | ORAL_TABLET | Freq: Three times a day (TID) | ORAL | 0 refills | Status: DC | PRN
Start: 2020-03-06 — End: 2020-06-22

## 2020-03-06 NOTE — Discharge Summary (Signed)
Physician Discharge Summary  Jeff Barnes ZOX:[id number] DOB: [birth year] DOA: 03/05/2020  PCP: Jeff Ada, MD  Admit date: 03/05/2020 Discharge date: 03/06/2020  Recommendations for Outpatient Follow-up:  1.  (include homehealth, outpatient follow-up instructions, specific recommendations for PCP to follow-up on, etc.)   Follow-up Information    Jeff Barnes, Arta Bruce, MD Follow up in 3 week(s).   Specialty: General Surgery Contact information: Jeff Barnes [address] [phone]              Discharge Diagnoses:  Active Problems:   Right inguinal hernia   Surgical Procedure: right inguinal hernia repair  Discharge Condition: Good Disposition: Home  Diet recommendation: reg diet   Hospital Course:  49 yo male with MI 6 months ago underwent R open inguinal hernia repair. Due to stent concern he was kept on antiplatelet therapy and therefore watched overnight for bleeding risk. He did not have any significant bleed or hematoma. He tolerated food and ambulating well and was discharged home POD 1.  Discharge Instructions  Discharge Instructions    Call MD for:  difficulty breathing, headache or visual disturbances   Complete by: As directed    Call MD for:  hives   Complete by: As directed    Call MD for:  persistant nausea and vomiting   Complete by: As directed    Call MD for:  redness, tenderness, or signs of infection (pain, swelling, redness, odor or green/yellow discharge around incision site)   Complete by: As directed    Call MD for:  severe uncontrolled pain   Complete by: As directed    Call MD for:  temperature >100.4   Complete by: As directed    Diet - low sodium heart healthy   Complete by: As directed    Discharge wound care:   Complete by: As directed    Ok to shower today. Glue will likely peel off in 1-3 weeks. No bandage required. Recommend ice to groin multiple times a day for next week.   Driving Restrictions    Complete by: As directed    No driving while on narcotics   Increase activity slowly   Complete by: As directed    Lifting restrictions   Complete by: As directed    No lifting greater than 20 pounds for 3 weeks     Allergies as of 03/06/2020   No Known Allergies     Medication List    TAKE these medications   ALLEGRA PO Take 180 mg by mouth daily.   aspirin 81 MG chewable tablet Chew 1 tablet (81 mg total) by mouth daily.   atorvastatin 80 MG tablet Commonly known as: LIPITOR Take 1 tablet (80 mg total) by mouth daily at 6 PM.   carvedilol 6.25 MG tablet Commonly known as: COREG Take 1 tablet (6.25 mg total) by mouth 2 (two) times daily with a meal.   ibuprofen 800 MG tablet Commonly known as: ADVIL Take 1 tablet (800 mg total) by mouth every 8 (eight) hours as needed.   naphazoline-pheniramine 0.025-0.3 % ophthalmic solution Commonly known as: NAPHCON-A Place 1 drop into both eyes 4 (four) times daily as needed for eye irritation or allergies.   NexIUM 20 MG capsule Generic drug: esomeprazole Take 20 mg by mouth daily.   nitroGLYCERIN 0.4 MG SL tablet Commonly known as: NITROSTAT Place 1 tablet (0.4 mg total) under the tongue every 5 (five) minutes x 3 doses as needed for chest pain.  oxyCODONE 5 MG immediate release tablet Commonly known as: Oxy IR/ROXICODONE Take 1 tablet (5 mg total) by mouth every 6 (six) hours as needed for severe pain.   sacubitril-valsartan 24-26 MG Commonly known as: ENTRESTO Take 1 tablet by mouth 2 (two) times daily.   ticagrelor 90 MG Tabs tablet Commonly known as: BRILINTA Take 1 tablet (90 mg total) by mouth 2 (two) times daily.            Discharge Care Instructions  (From admission, onward)         Start     Ordered   03/06/20 0000  Discharge wound care:       Comments: Ok to shower today. Glue will likely peel off in 1-3 weeks. No bandage required. Recommend ice to groin multiple times a day for next week.    03/06/20 7737          Follow-up Information    Jeff Barnes, Arta Bruce, MD Follow up in 3 week(s).   Specialty: General Surgery Contact information: [place] [address] [phone]                The results of significant diagnostics from this hospitalization (including imaging, microbiology, ancillary and laboratory) are listed below for reference.    Significant Diagnostic Studies: No results found.  Labs: Basic Metabolic Panel: No results for input(s): NA, K, CL, CO2, GLUCOSE, BUN, CREATININE, CALCIUM, MG, PHOS in the last 168 hours. Liver Function Tests: No results for input(s): AST, ALT, ALKPHOS, BILITOT, PROT, ALBUMIN in the last 168 hours.  CBC: Recent Labs  Lab 03/05/20 1556 03/06/20 0258  WBC 10.7* 13.5*  HGB 14.4 13.6  HCT 43.4 40.8  MCV 92.9 92.7  PLT 186 202    CBG: No results for input(s): GLUCAP in the last 168 hours.  Active Problems:   Right inguinal hernia   Time coordinating discharge: 15 min

## 2020-03-06 NOTE — Discharge Instructions (Signed)
[facility] _______[facility] ° °UMBILICAL OR INGUINAL HERNIA REPAIR: POST OP INSTRUCTIONS ° °Always review your discharge instruction sheet given to you by the facility where your surgery was performed. °IF YOU HAVE DISABILITY OR FAMILY LEAVE FORMS, YOU MUST BRING THEM TO THE OFFICE FOR PROCESSING.   °DO NOT GIVE THEM TO YOUR DOCTOR. ° °1. A  prescription for pain medication may be given to you upon discharge.  Take your pain medication as prescribed, if needed.  If narcotic pain medicine is not needed, then you may take acetaminophen (Tylenol) or ibuprofen (Advil) as needed. °2. Take your usually prescribed medications unless otherwise directed. °If you need a refill on your pain medication, please contact your pharmacy.  They will contact our office to request authorization. Prescriptions will not be filled after 5 pm or on week-ends. °3. You should follow a light diet the first 24 hours after arrival home, such as soup and crackers, etc.  Be sure to include lots of fluids daily.  Resume your normal diet the day after surgery. °4.Most patients will experience some swelling and bruising around the umbilicus or in the groin and scrotum.  Ice packs and reclining will help.  Swelling and bruising can take several days to resolve.  °6. It is common to experience some constipation if taking pain medication after surgery.  Increasing fluid intake and taking a stool softener (such as Colace) will usually help or prevent this problem from occurring.  A mild laxative (Milk of Magnesia or Miralax) should be taken according to package directions if there are no bowel movements after 48 hours. °7. Unless discharge instructions indicate otherwise, you may remove your bandages 24-48 hours after surgery, and you may shower at that time.  You may have steri-strips (small skin tapes) in place directly over the incision.  These strips should be left on the skin for 7-10 days.  If your surgeon used skin glue on the  incision, you may shower in 24 hours.  The glue will flake off over the next 2-3 weeks.  Any sutures or staples will be removed at the office during your follow-up visit. °8. ACTIVITIES:  You may resume regular (light) daily activities beginning the next day--such as daily self-care, walking, climbing stairs--gradually increasing activities as tolerated.  You may have sexual intercourse when it is comfortable.  Refrain from any heavy lifting or straining until approved by your doctor. ° °a.You may drive when you are no longer taking prescription pain medication, you can comfortably wear a seatbelt, and you can safely maneuver your car and apply brakes. °b.RETURN TO WORK:   °_____________________________________________ ° °9.You should see your doctor in the office for a follow-up appointment approximately 2-3 weeks after your surgery.  Make sure that you call for this appointment within a day or two after you arrive home to insure a convenient appointment time. °10.OTHER INSTRUCTIONS: _________________________ °   _____________________________________ ° °WHEN TO CALL YOUR DOCTOR: °1. Fever over 101.0 °2. Inability to urinate °3. Nausea and/or vomiting °4. Extreme swelling or bruising °5. Continued bleeding from incision. °6. Increased pain, redness, or drainage from the incision ° °The clinic staff is available to answer your questions during regular business hours.  Please don’t hesitate to call and ask to speak to one of the nurses for clinical concerns.  If you have a medical emergency, go to the nearest emergency room or call 911.  A surgeon from [facility] is always on call at the hospital ° ° °  [address], [address], [place], [place]  [address] ? ° [address] [address], [place], [place]   [address] °[phone] ? [phone] ? FAX [phone] °Web site: [email or web address] °

## 2020-06-11 ENCOUNTER — Other Ambulatory Visit: Payer: Self-pay | Admitting: Gastroenterology

## 2020-06-11 DIAGNOSIS — R1311 Dysphagia, oral phase: Secondary | ICD-10-CM

## 2020-06-22 ENCOUNTER — Other Ambulatory Visit: Payer: Self-pay

## 2020-06-22 ENCOUNTER — Encounter: Payer: Self-pay | Admitting: Internal Medicine

## 2020-06-22 ENCOUNTER — Ambulatory Visit (INDEPENDENT_AMBULATORY_CARE_PROVIDER_SITE_OTHER): Payer: 59 | Admitting: Internal Medicine

## 2020-06-22 VITALS — BP 118/80 | HR 76 | Temp 97.9°F | Ht 71.0 in | Wt 170.0 lb

## 2020-06-22 DIAGNOSIS — I2510 Atherosclerotic heart disease of native coronary artery without angina pectoris: Secondary | ICD-10-CM | POA: Diagnosis not present

## 2020-06-22 DIAGNOSIS — E785 Hyperlipidemia, unspecified: Secondary | ICD-10-CM | POA: Diagnosis not present

## 2020-06-22 DIAGNOSIS — I255 Ischemic cardiomyopathy: Secondary | ICD-10-CM

## 2020-06-22 DIAGNOSIS — I214 Non-ST elevation (NSTEMI) myocardial infarction: Secondary | ICD-10-CM

## 2020-06-22 NOTE — Patient Instructions (Signed)
Medication Instructions:  STOP BRILINTA- ON December 1st 2021 *If you need a refill on your cardiac medications before your next appointment, please call your pharmacy*  Lab Work: A1C, LIPIDS, and LFT- PLEASE RETURN TO HAVE THIS DONE IN THE NEXT COUPLE OF WEEKS- YOU WILL NEED TO BE FASTING PRIOR TO THESE  If you have labs (blood work) drawn today and your tests are completely normal, you will receive your results only by: Marland Kitchen MyChart Message (if you have MyChart) OR . A paper copy in the mail If you have any lab test that is abnormal or we need to change your treatment, we will call you to review the results.  Testing/Procedures: Your physician has requested that you have an echocardiogram. Echocardiography is a painless test that uses sound waves to create images of your heart. It provides your doctor with information about the size and shape of your heart and how well your heart's chambers and valves are working. You may receive an ultrasound enhancing agent through an IV if needed to better visualize your heart during the echo.This procedure takes approximately one hour. There are no restrictions for this procedure. This will take place at the [address].   Follow-Up: At [facility], you and your health needs are our priority.  As part of our continuing mission to provide you with exceptional heart care, we have created designated Provider Care Teams.  These Care Teams include your primary Cardiologist (physician) and Advanced Practice Providers (APPs -  Physician Assistants and Nurse Practitioners) who all work together to provide you with the care you need, when you need it.  Your next appointment:   6 month(s)  The format for your next appointment:   In Person  Provider:   Cherlynn Kaiser, MD

## 2020-06-22 NOTE — Progress Notes (Signed)
Cardiology Office Note:    Date:  06/22/2020   ID:  Jeff Barnes, DOB [birth year], MRN [id number]  PCP:  Carol Ada, MD  Cardiologist:  Elouise Munroe, MD  Electrophysiologist:  None   Referring MD: Carol Ada, MD   Chief Complaint/Reason for Referral: Prior NSTEMI, ostial LAD stent, ischemic cardiomyopathy with recovery of EF  History of Present Illness:    Jeff Barnes is a 49 y.o. male with a history of GERD, hyperlipidemia, NSTEMI August 06, 2019 with reduced ejection fraction. Started on heart failure therapy and EF has recovered. He had done very well with physical activity after MI, participated in cardiac rehab. Had one episode of atypical chest pain, stress nuclear study was negative for ischemia.   He has done well without exertional chest pain.  He is exercising with a trainer several times a week and has recovered well from inguinal hernia repair.  Continues on aspirin and Brilinta without bleeding.  We discussed stopping Brilinta 08/05/2020 after completing 1 year of therapy.  We also discussed that EF has recovered on Entresto and carvedilol, these medications should be continued.  He is anticipating his wedding in March.   Occasionally has musculoskeletal chest pain, notices it while lifting his left arm.  Also notices discomfort in his chest with seasonal allergies.  No concerning signs of angina.   Past Medical History:  Diagnosis Date  . Cancer ([place])    skin cancer on left face  . Coronary artery disease   . GERD (gastroesophageal reflux disease)   . H/O scarlet fever   . Myocardial infarction [facility])    November  . Pneumonia     Past Surgical History:  Procedure Laterality Date  . BACK SURGERY    . CARDIAC CATHETERIZATION    . CORONARY STENT INTERVENTION  08/06/2019  . CORONARY STENT INTERVENTION N/A 08/06/2019   Procedure: CORONARY STENT INTERVENTION;  Surgeon: Troy Sine, MD;  Location: [facility];  Service:  Cardiovascular;  Laterality: N/A;  . INGUINAL HERNIA REPAIR Right 03/05/2020   Procedure: RIGHT OPEN INGUINAL HERNIA REPAIR WITH MESH;  Surgeon: Kinsinger, Arta Bruce, MD;  Location: [facility];  Service: General;  Laterality: Right;  . LEFT HEART CATH AND CORONARY ANGIOGRAPHY N/A 08/06/2019   Procedure: LEFT HEART CATH AND CORONARY ANGIOGRAPHY;  Surgeon: Troy Sine, MD;  Location: [place] [facility];  Service: Cardiovascular;  Laterality: N/A;    Current Medications: Current Meds  Medication Sig  . aspirin 81 MG chewable tablet Chew 1 tablet (81 mg total) by mouth daily.  Marland Kitchen atorvastatin (LIPITOR) 80 MG tablet Take 1 tablet (80 mg total) by mouth daily at 6 PM.  . carvedilol (COREG) 6.25 MG tablet Take 1 tablet (6.25 mg total) by mouth 2 (two) times daily with a meal.  . esomeprazole (NEXIUM) 20 MG capsule Take 20 mg by mouth daily.   Marland Kitchen Fexofenadine HCl (ALLEGRA PO) Take 180 mg by mouth as needed.   . naphazoline-pheniramine (NAPHCON-A) 0.025-0.3 % ophthalmic solution Place 1 drop into both eyes 4 (four) times daily as needed for eye irritation or allergies.  . nitroGLYCERIN (NITROSTAT) 0.4 MG SL tablet Place 1 tablet (0.4 mg total) under the tongue every 5 (five) minutes x 3 doses as needed for chest pain.  . sacubitril-valsartan (ENTRESTO) 24-26 MG Take 1 tablet by mouth 2 (two) times daily.  . ticagrelor (BRILINTA) 90 MG TABS tablet Take 1 tablet (90 mg total) by mouth 2 (two) times daily.  . [  DISCONTINUED] ibuprofen (ADVIL) 800 MG tablet Take 1 tablet (800 mg total) by mouth every 8 (eight) hours as needed.  . [DISCONTINUED] oxyCODONE (OXY IR/ROXICODONE) 5 MG immediate release tablet Take 1 tablet (5 mg total) by mouth every 6 (six) hours as needed for severe pain.     Allergies:   Patient has no known allergies.   Social History   Tobacco Use  . Smoking status: Never Smoker  . Smokeless tobacco: Never Used  Vaping Use  . Vaping Use: Never used  Substance Use Topics  . Alcohol use:  Yes    Comment: occas.  . Drug use: Never     Family History: The patient's family history includes CAD in his father; Cancer in his father.  ROS:   Please see the history of present illness.    All other systems reviewed and are negative.  EKGs/Labs/Other Studies Reviewed:    The following studies were reviewed today:   Recent Labs: 02/26/2020: BUN 10; Creatinine, Ser 0.79; Potassium 3.8; Sodium 140 03/06/2020: Hemoglobin 13.6; Platelets 202  Recent Lipid Panel    Component Value Date/Time   CHOL 178 08/06/2019 0407   TRIG 90 08/06/2019 0407   HDL 39 (L) 08/06/2019 0407   CHOLHDL 4.6 08/06/2019 0407   VLDL 18 08/06/2019 0407   LDLCALC 121 (H) 08/06/2019 0407    Physical Exam:    VS:  BP 118/80 (BP Location: Left Arm, Patient Position: Sitting, Cuff Size: Normal)   Pulse 76   Temp 97.9 F (36.6 C)   Ht 5\' 11"  (1.803 m)   Wt 170 lb (77.1 kg)   BMI 23.71 kg/m     Wt Readings from Last 5 Encounters:  06/22/20 170 lb (77.1 kg)  03/05/20 166 lb 3.6 oz (75.4 kg)  02/27/20 166 lb 3.2 oz (75.4 kg)  02/26/20 163 lb (73.9 kg)  02/26/20 167 lb (75.8 kg)    Constitutional: No acute distress Eyes: sclera non-icteric, normal conjunctiva and lids ENMT: normal dentition, moist mucous membranes Cardiovascular: regular rhythm, normal rate, no murmurs. S1 and S2 normal. Radial pulses normal bilaterally. No jugular venous distention.  Respiratory: clear to auscultation bilaterally GI : normal bowel sounds, soft and nontender. No distention.   MSK: extremities warm, well perfused. No edema.  NEURO: grossly nonfocal exam, moves all extremities. PSYCH: alert and oriented x 3, normal mood and affect.   ASSESSMENT:    1. Ischemic cardiomyopathy   2. NSTEMI (non-ST elevated myocardial infarction) ([place])   3. Coronary artery disease involving native coronary artery of native heart without angina pectoris   4. Hyperlipidemia, unspecified hyperlipidemia type    PLAN:    NSTEMI  08/06/2019 Stent in ostial LAD Ischemic cardiomyopathy with recovery of EF after revascularization and heart failure therapy -Continue aspirin 81 mg indefinitely -Okay to stop Brilinta 08/05/2020 year of dual antiplatelet therapy -Continue Entresto and Coreg, continue statin -Repeat echo, if stable continue current therapy.  Hyperlipidemia- -Mild myalgias on Lipitor 80 mg daily.  Lipids have also not been rechecked since hospitalization.  We will recheck a CMP and lipids now.  Total time of encounter: 30 minutes total time of encounter, including 20 minutes spent in face-to-face patient care on the date of this encounter. This time includes coordination of care and counseling regarding above mentioned problem list. Remainder of non-face-to-face time involved reviewing chart documents/testing relevant to the patient encounter and documentation in the medical record. I have independently reviewed documentation from referring provider.   Cherlynn Kaiser, MD [facility]    Medication Adjustments/Labs and Tests Ordered: Current medicines are reviewed at length with the patient today.  Concerns regarding medicines are outlined above.   Orders Placed This Encounter  Procedures  . Lipid panel  . Hemoglobin A1c  . Comprehensive metabolic panel  . ECHOCARDIOGRAM COMPLETE    No orders of the defined types were placed in this encounter.   Patient Instructions  Medication Instructions:  STOP BRILINTA- ON December 1st 2021 *If you need a refill on your cardiac medications before your next appointment, please call your pharmacy*  Lab Work: A1C, LIPIDS, and LFT- PLEASE RETURN TO HAVE THIS DONE IN THE NEXT COUPLE OF WEEKS- YOU WILL NEED TO BE FASTING PRIOR TO THESE  If you have labs (blood work) drawn today and your tests are completely normal, you will receive your results only by: Marland Kitchen MyChart Message (if you have MyChart) OR . A paper copy in the mail If you have any lab test  that is abnormal or we need to change your treatment, we will call you to review the results.  Testing/Procedures: Your physician has requested that you have an echocardiogram. Echocardiography is a painless test that uses sound waves to create images of your heart. It provides your doctor with information about the size and shape of your heart and how well your heart's chambers and valves are working. You may receive an ultrasound enhancing agent through an IV if needed to better visualize your heart during the echo.This procedure takes approximately one hour. There are no restrictions for this procedure. This will take place at the [address].   Follow-Up: At [facility], you and your health needs are our priority.  As part of our continuing mission to provide you with exceptional heart care, we have created designated Provider Care Teams.  These Care Teams include your primary Cardiologist (physician) and Advanced Practice Providers (APPs -  Physician Assistants and Nurse Practitioners) who all work together to provide you with the care you need, when you need it.  Your next appointment:   6 month(s)  The format for your next appointment:   In Person  Provider:   Cherlynn Kaiser, MD

## 2020-07-08 ENCOUNTER — Other Ambulatory Visit: Payer: 59 | Admitting: *Deleted

## 2020-07-08 ENCOUNTER — Ambulatory Visit (HOSPITAL_COMMUNITY): Payer: 59 | Attending: Internal Medicine

## 2020-07-08 ENCOUNTER — Other Ambulatory Visit: Payer: Self-pay

## 2020-07-08 DIAGNOSIS — E785 Hyperlipidemia, unspecified: Secondary | ICD-10-CM | POA: Diagnosis present

## 2020-07-08 DIAGNOSIS — I255 Ischemic cardiomyopathy: Secondary | ICD-10-CM | POA: Insufficient documentation

## 2020-07-08 DIAGNOSIS — I2510 Atherosclerotic heart disease of native coronary artery without angina pectoris: Secondary | ICD-10-CM | POA: Diagnosis present

## 2020-07-08 LAB — ECHOCARDIOGRAM COMPLETE
Area-P 1/2: 3.37 cm2
S' Lateral: 3 cm

## 2020-07-09 LAB — COMPREHENSIVE METABOLIC PANEL
ALT: 34 IU/L (ref 0–44)
AST: 23 IU/L (ref 0–40)
Albumin/Globulin Ratio: 1.8 (ref 1.2–2.2)
Albumin: 4.6 g/dL (ref 4.0–5.0)
Alkaline Phosphatase: 56 IU/L (ref 44–121)
BUN/Creatinine Ratio: 15 (ref 9–20)
BUN: 11 mg/dL (ref 6–24)
Bilirubin Total: 0.4 mg/dL (ref 0.0–1.2)
CO2: 26 mmol/L (ref 20–29)
Calcium: 9.4 mg/dL (ref 8.7–10.2)
Chloride: 101 mmol/L (ref 96–106)
Creatinine, Ser: 0.72 mg/dL — ABNORMAL LOW (ref 0.76–1.27)
GFR calc Af Amer: 128 mL/min/{1.73_m2} (ref >59)
GFR calc non Af Amer: 111 mL/min/{1.73_m2} (ref >59)
Globulin, Total: 2.5 g/dL (ref 1.5–4.5)
Glucose: 98 mg/dL (ref 65–99)
Potassium: 4.4 mmol/L (ref 3.5–5.2)
Sodium: 140 mmol/L (ref 134–144)
Total Protein: 7.1 g/dL (ref 6.0–8.5)

## 2020-07-09 LAB — LIPID PANEL
Chol/HDL Ratio: 2.9 ratio (ref 0.0–5.0)
Cholesterol, Total: 140 mg/dL (ref 100–199)
HDL: 48 mg/dL (ref >39)
LDL Chol Calc (NIH): 81 mg/dL (ref 0–99)
Triglycerides: 53 mg/dL (ref 0–149)
VLDL Cholesterol Cal: 11 mg/dL (ref 5–40)

## 2020-07-09 LAB — HEMOGLOBIN A1C
Est. average glucose Bld gHb Est-mCnc: 111 mg/dL
Hgb A1c MFr Bld: 5.5 % (ref 4.8–5.6)

## 2020-07-24 ENCOUNTER — Telehealth: Payer: Self-pay

## 2020-07-24 MED ORDER — TICAGRELOR 90 MG PO TABS: 90.0000 mg | ORAL_TABLET | Freq: Two times a day (BID) | ORAL | 0 refills | Status: DC

## 2020-07-24 NOTE — Telephone Encounter (Signed)
No contraindication to use of plavix and omeprazole, as demonstrated in more recent, large studies. Ok to take both medications.

## 2020-07-24 NOTE — Telephone Encounter (Signed)
Jeff Munroe, MD       Echo normal. I called the patient this afternoon. Though we had previously discussed stopping DAPT at 1 year, we discussed this afternoon remaining on DAPT for another 2-3 years at a minimum based on STEMI and location of stent, and size of wrap around LAD. At 1 year mark, 08/05/20, we will switch Brilinta to clopidogrel 75 mg daily. Discussed risks and benefits of remaining on DAPT vs stopping at one year. Please call in a script for clopidogrel 75 mg daily, to be started when he finished his current script for Brilinta. Continue aspirin indefinitely.    Spoke with patient in regards to patient Echo Results. Patient previously discussed results with Dr. Margaretann Loveless. Made patient aware that Plavix (Clopidogrel) 75mg  should be started 12 hours after last dose of Brillinta 12/1. Advised patient not to take both medications together, Clopidogrel 75mg  is to be started 12 hours after last dose of Brillinta and Clopidogrel will be (1) 75mg  Tablet Daily. Patient verbalized understanding of all instructions.   Advised patient to let us know of any questions or concerns.   Patient verbalized understanding.   Patient is currently taking Nexium daily, will forward to Dr. Margaretann Loveless for review and advice.

## 2020-07-28 NOTE — Telephone Encounter (Signed)
Attempted to call patient, left message for patient to call back to office.   

## 2020-07-29 MED ORDER — CLOPIDOGREL BISULFATE 75 MG PO TABS
75.0000 mg | ORAL_TABLET | Freq: Every day | ORAL | 3 refills | Status: DC
Start: 2020-07-29 — End: 2020-09-02

## 2020-07-29 NOTE — Telephone Encounter (Addendum)
Spoke with patient and advised him of Dr. Delphina Cahill advice.   Elouise Munroe, MD     No contraindication to use of plavix and omeprazole, as demonstrated in more recent, large studies. Ok to take both medications.      Advised patient to start Clopidogrel (Plavix) 75mg  daily on 12/1.   Prescription sent to pharmacy.   Patient verbalized understanding.

## 2020-07-29 NOTE — Addendum Note (Signed)
Addended by: Rexanne Mano B on: 07/29/2020 04:21 PM   Modules accepted: Orders

## 2020-09-02 MED ORDER — TICAGRELOR 60 MG PO TABS: 60.0000 mg | ORAL_TABLET | Freq: Two times a day (BID) | ORAL | 11 refills | Status: DC

## 2020-09-03 ENCOUNTER — Other Ambulatory Visit: Payer: Self-pay | Admitting: Internal Medicine

## 2020-09-14 MED ORDER — SACUBITRIL-VALSARTAN 24-26 MG PO TABS: 1.0000 | ORAL_TABLET | Freq: Two times a day (BID) | ORAL | 3 refills | Status: DC

## 2020-09-14 MED ORDER — ATORVASTATIN CALCIUM 80 MG PO TABS: 80.0000 mg | ORAL_TABLET | Freq: Every day | ORAL | 3 refills | Status: DC

## 2020-09-14 MED ORDER — CARVEDILOL 6.25 MG PO TABS: 6.2500 mg | ORAL_TABLET | Freq: Two times a day (BID) | ORAL | 3 refills | Status: DC

## 2020-09-14 MED ORDER — TICAGRELOR 60 MG PO TABS: 60.0000 mg | ORAL_TABLET | Freq: Two times a day (BID) | ORAL | 3 refills | Status: DC

## 2020-09-23 ENCOUNTER — Other Ambulatory Visit: Payer: Self-pay | Admitting: Gastroenterology

## 2020-09-23 DIAGNOSIS — R1084 Generalized abdominal pain: Secondary | ICD-10-CM

## 2020-10-08 ENCOUNTER — Other Ambulatory Visit: Payer: Self-pay

## 2020-10-08 ENCOUNTER — Ambulatory Visit
Admission: RE | Admit: 2020-10-08 | Discharge: 2020-10-08 | Disposition: A | Discharge status: Home / Self Care | Payer: 59 | Source: Ambulatory Visit | Attending: Gastroenterology | Admitting: Gastroenterology

## 2020-10-08 DIAGNOSIS — R1084 Generalized abdominal pain: Secondary | ICD-10-CM

## 2020-10-08 MED ORDER — IOPAMIDOL (ISOVUE-300) INJECTION 61%
100.0000 mL | Freq: Once | INTRAVENOUS | Status: AC | PRN
  Administered 2020-10-08: 100 mL via INTRAVENOUS

## 2020-10-22 ENCOUNTER — Other Ambulatory Visit: Payer: Self-pay | Admitting: Neurosurgery

## 2020-10-22 DIAGNOSIS — M5414 Radiculopathy, thoracic region: Secondary | ICD-10-CM

## 2020-10-22 DIAGNOSIS — M5416 Radiculopathy, lumbar region: Secondary | ICD-10-CM

## 2020-11-14 ENCOUNTER — Ambulatory Visit
Admission: RE | Admit: 2020-11-14 | Discharge: 2020-11-14 | Disposition: A | Discharge status: Home / Self Care | Payer: 59 | Source: Ambulatory Visit | Attending: Neurosurgery | Admitting: Neurosurgery

## 2020-11-14 DIAGNOSIS — M5414 Radiculopathy, thoracic region: Secondary | ICD-10-CM

## 2020-11-14 DIAGNOSIS — M5416 Radiculopathy, lumbar region: Secondary | ICD-10-CM

## 2020-12-21 ENCOUNTER — Ambulatory Visit: Payer: 59 | Attending: Critical Care Medicine

## 2020-12-21 DIAGNOSIS — Z20822 Contact with and (suspected) exposure to covid-19: Secondary | ICD-10-CM

## 2020-12-21 MED ORDER — TICAGRELOR 60 MG PO TABS: 60.0000 mg | ORAL_TABLET | Freq: Two times a day (BID) | ORAL | 3 refills | Status: DC

## 2020-12-22 LAB — NOVEL CORONAVIRUS, NAA: SARS-CoV-2, NAA: NOT DETECTED

## 2020-12-22 LAB — SARS-COV-2, NAA 2 DAY TAT

## 2020-12-29 ENCOUNTER — Ambulatory Visit: Payer: 59 | Admitting: Internal Medicine

## 2021-02-26 ENCOUNTER — Telehealth: Payer: Self-pay | Admitting: *Deleted

## 2021-02-26 NOTE — Telephone Encounter (Signed)
   [facility] Pre-operative Risk Assessment    Patient Name: HOANG PETTINGILL  DOB: 17-Feb-1971  MRN: [id number]   Gary: - Please ensure there is not already an duplicate clearance open for this procedure. - Under Visit Info/Reason for Call, type in Other and utilize the format Clearance MM/DD/YY or Clearance TBD. Do not use dashes or single digits. - If request is for dental extraction, please clarify the # of teeth to be extracted. - If the patient is currently at the dentist's office, call Pre-Op APP to address. If the patient is not currently in the dentist office, please route to the Pre-Op pool  Request for surgical clearance:  What type of surgery is being performed? Colonoscopy/endoscopy   When is this surgery scheduled? 03/29/21   What type of clearance is required (medical clearance vs. Pharmacy clearance to hold med vs. Both)? both  Are there any medications that need to be held prior to surgery and how long?brilinta-need direction   Practice name and name of physician performing surgery? [facility]   What is the office phone number? [phone]   7.   What is the office fax number? [phone]  8.   Anesthesia type (None, local, MAC, general) ? propofol   Fredia Beets 02/26/2021, 2:10 PM  _________________________________________________________________   (provider comments below)

## 2021-02-26 NOTE — Telephone Encounter (Signed)
   Primary Cardiologist: Elouise Munroe, MD  Chart reviewed as part of pre-operative protocol coverage. Given past medical history and time since last visit, based on ACC/AHA guidelines, Jeff Barnes would be at acceptable risk for the planned procedure without further cardiovascular testing.   Patient was given instructions by cardiology to stop Brilinta 08/05/2020.  He should no longer be taking the medication.  I will route this recommendation to the requesting party via Epic fax function and remove from pre-op pool.  Please call with questions.  Jossie Ng. Demetrius Mahler NP-C    02/26/2021, 3:29 PM [facility] [place] [address] Office [phone] Fax [phone]

## 2021-02-26 NOTE — Telephone Encounter (Signed)
Patient is not on anticoagulation. Brilinta to be address by pre-op APP or MD

## 2021-03-09 NOTE — Telephone Encounter (Signed)
Jeff Barnes 50 year old male is requesting preoperative cardiac evaluation for colonoscopy/endoscopy.  Patient was initially planned to switch from Brilinta to Plavix and aspirin on 08/05/2020.  However patient did not tolerate Plavix.  Plan was made to continue patient on Brilinta 60 and aspirin for 2-3 years based on STEMI and location of stent.  He was last seen in the clinic on 06/22/2020.  He continued to do well without exertional chest pain.  He was exercising several times per week and had recovered well from his inguinal hernia repair.  May his Brilinta be held prior to his procedure?  Thank you for your help.  Please direct your response to CV DIV preop pool.    Jossie Ng. [name] NP-C    03/09/2021, 10:41 AM [place] [address] Office [phone] Fax [phone]

## 2021-03-15 NOTE — Telephone Encounter (Signed)
   Name: Jeff Barnes  DOB: 11-05-1970  MRN: [id number]   Primary Cardiologist: Elouise Munroe, MD  Chart reviewed as part of pre-operative protocol coverage.   Pt was cleared for procedure by Coletta Memos NP on 03/09/21.  Per Dr. Margaretann Loveless: Patient may hold brilinta for 5 days prior to colonoscopy and should continue aspirin throughout periprocedural period. I sent a message to the patient with these instructions and discussed that stopping DAPT >1 year after proximal stent involves low, but not zero risk of MACE, for procedures.    I will route this recommendation to the requesting party via Epic fax function and remove from pre-op pool. Please call with questions.  Tami Lin Pawel Soules, PA 03/15/2021, 5:03 PM

## 2021-04-01 ENCOUNTER — Ambulatory Visit: Payer: 59 | Admitting: Internal Medicine

## 2021-04-01 ENCOUNTER — Other Ambulatory Visit: Payer: Self-pay

## 2021-04-01 ENCOUNTER — Encounter: Payer: Self-pay | Admitting: Internal Medicine

## 2021-04-01 VITALS — BP 116/82 | HR 68 | Ht 71.0 in | Wt 173.0 lb

## 2021-04-01 DIAGNOSIS — I214 Non-ST elevation (NSTEMI) myocardial infarction: Secondary | ICD-10-CM | POA: Diagnosis not present

## 2021-04-01 DIAGNOSIS — E785 Hyperlipidemia, unspecified: Secondary | ICD-10-CM

## 2021-04-01 DIAGNOSIS — I2510 Atherosclerotic heart disease of native coronary artery without angina pectoris: Secondary | ICD-10-CM

## 2021-04-01 DIAGNOSIS — I255 Ischemic cardiomyopathy: Secondary | ICD-10-CM

## 2021-04-01 NOTE — Patient Instructions (Signed)
Medication Instructions:  No Changes In Medications at this time.  *If you need a refill on your cardiac medications before your next appointment, please call your pharmacy*  Testing/Procedures: Your physician has requested that you have an echocardiogram. Echocardiography is a painless test that uses sound waves to create images of your heart. It provides your doctor with information about the size and shape of your heart and how well your heart's chambers and valves are working. You may receive an ultrasound enhancing agent through an IV if needed to better visualize your heart during the echo.This procedure takes approximately one hour. There are no restrictions for this procedure. This will take place at the [address].   Follow-Up: At [facility], you and your health needs are our priority.  As part of our continuing mission to provide you with exceptional heart care, we have created designated Provider Care Teams.  These Care Teams include your primary Cardiologist (physician) and Advanced Practice Providers (APPs -  Physician Assistants and Nurse Practitioners) who all work together to provide you with the care you need, when you need it.  Your next appointment:   1 year(s)  The format for your next appointment:   In Person  Provider:   Cherlynn Kaiser, MD  Other Instructions [place] (KRISTEN) TO DISCUSS LIPIDS/PCSK-9 at [place]

## 2021-04-01 NOTE — Progress Notes (Signed)
Cardiology Office Note:    Date:  04/01/2021   ID:  Jeff Barnes, DOB 07-Jun-1971, MRN [id number]  PCP:  Carol Ada, MD  Cardiologist:  Elouise Munroe, MD  Electrophysiologist:  None   Referring MD: Carol Ada, MD   Chief Complaint/Reason for Referral: Prior NSTEMI, ostial LAD stent, ischemic cardiomyopathy with recovery of EF  History of Present Illness:    Jeff Barnes is a 50 y.o. male with a history of GERD, hyperlipidemia, NSTEMI August 06, 2019 with reduced ejection fraction. Started on heart failure therapy and EF has recovered. He had done very well with physical activity after MI, participated in cardiac rehab. Had one episode of atypical chest pain, stress nuclear study was negative for ischemia.   Today, he is feeling good overall. He is now s/p colonoscopy, and is scheduled for a biopsy of a found polyp soon. He denies any bleeding issues during the procedure or otherwise. However, he does endorse bruising easily. Generally he is staying active without chest pain or shortness of breath.  His most recent labs revealed his LDL is 81 and HDL is 48. We discussed the option of using PCSK9 inhibitors for his cholesterol management given secondary prevention of CAD and goal LDL of 55-70 to be as preventive as possible.  He is tolerating his medicine well and denies any myalgias or other side effects.  He denies any chest pain, shortness of breath, palpitations, or exertional symptoms. No headaches, lightheadedness, or syncope to report. Also has no lower extremity edema, orthopnea or PND.  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)    November   Pneumonia     Past Surgical History:  Procedure Laterality Date   BACK SURGERY     CARDIAC CATHETERIZATION     CORONARY STENT INTERVENTION   08/06/2019   CORONARY STENT INTERVENTION N/A 08/06/2019   Procedure: CORONARY STENT INTERVENTION;  Surgeon: Troy Sine, MD;  Location: [facility];  Service: Cardiovascular;  Laterality: N/A;   INGUINAL HERNIA REPAIR Right 03/05/2020   Procedure: RIGHT OPEN INGUINAL HERNIA REPAIR WITH MESH;  Surgeon: Kinsinger, Arta Bruce, MD;  Location: [facility];  Service: General;  Laterality: Right;   LEFT HEART CATH AND CORONARY ANGIOGRAPHY N/A 08/06/2019   Procedure: LEFT HEART CATH AND CORONARY ANGIOGRAPHY;  Surgeon: Troy Sine, MD;  Location: [facility];  Service: Cardiovascular;  Laterality: N/A;    Current Medications: Current Meds  Medication Sig   aspirin 81 MG chewable tablet Chew 1 tablet (81 mg total) by mouth daily.   atorvastatin (LIPITOR) 80 MG tablet Take 1 tablet (80 mg total) by mouth daily at 6 PM.   carvedilol (COREG) 6.25 MG tablet Take 1 tablet (6.25 mg total) by mouth 2 (two) times daily with a meal.   esomeprazole (NEXIUM) 20 MG capsule Take 20 mg by mouth daily.    Fexofenadine HCl (ALLEGRA PO) Take 180 mg by mouth as needed.    linaclotide (LINZESS) 145 MCG CAPS capsule Take 145 mcg by mouth daily before breakfast.   naphazoline-pheniramine (NAPHCON-A) 0.025-0.3 % ophthalmic solution Place 1 drop into both eyes 4 (four) times daily as needed for eye irritation or allergies.   nitroGLYCERIN (NITROSTAT) 0.4 MG SL tablet Place 1 tablet (0.4 mg total) under the  tongue every 5 (five) minutes x 3 doses as needed for chest pain.   sacubitril-valsartan (ENTRESTO) 24-26 MG Take 1 tablet by mouth 2 (two) times daily.   ticagrelor (BRILINTA) 60 MG TABS tablet Take 1 tablet (60 mg total) by mouth 2 (two) times daily.     Allergies:   Patient has no known allergies.   Social History   Tobacco Use   Smoking status: Never   Smokeless tobacco: Never  Vaping Use   Vaping Use: Never used  Substance Use Topics   Alcohol use: Yes    Comment: occas.   Drug use: Never      Family History: The patient's family history includes CAD in his father; Cancer in his father.  ROS:   Please see the history of present illness.    (+) Bruises easily All other systems reviewed and are negative.  EKGs/Labs/Other Studies Reviewed:    The following studies were reviewed today:  Exercise Myoview 10/10/2019: CLinically and electrically negative for ischemia Normal perfusion. No ischemia or scar LVEF 57% This is a low risk study.  Echo 07/08/2020: 1. Left ventricular ejection fraction, by estimation, is 55 to 60%. The  left ventricle has normal function. The left ventricle has no regional  wall motion abnormalities. Left ventricular diastolic parameters were  normal.   2. Right ventricular systolic function is normal. The right ventricular  size is normal.   3. The mitral valve is normal in structure. Trivial mitral valve  regurgitation. No evidence of mitral stenosis.   4. The aortic valve is tricuspid. Aortic valve regurgitation is not  visualized. No aortic stenosis is present.   5. The right lower pulmonary vein is abnormal. The inferior vena cava is  normal in size with greater than 50% respiratory variability, suggesting  right atrial pressure of 3 mmHg.   EKG: 04/01/2021: Sinus rhythm. Rate 68 bpm. Septal infarct  Recent Labs: 07/08/2020: ALT 34; BUN 11; Creatinine, Ser 0.72; Potassium 4.4; Sodium 140  Recent Lipid Panel    Component Value Date/Time   CHOL 140 07/08/2020 0736   TRIG 53 07/08/2020 0736   HDL 48 07/08/2020 0736   CHOLHDL 2.9 07/08/2020 0736   CHOLHDL 4.6 08/06/2019 0407   VLDL 18 08/06/2019 0407   LDLCALC 81 07/08/2020 0736    Physical Exam:    VS:  BP 116/82 (BP Location: Right Arm, Patient Position: Sitting, Cuff Size: Normal)   Pulse 68   Ht '5\' 11"'$  (1.803 m)   Wt 173 lb (78.5 kg)   BMI 24.13 kg/m     Wt Readings from Last 5 Encounters:  04/01/21 173 lb (78.5 kg)  06/22/20 170 lb (77.1 kg)  03/05/20 166 lb 3.6 oz (75.4  kg)  02/27/20 166 lb 3.2 oz (75.4 kg)  02/26/20 163 lb (73.9 kg)    Constitutional: No acute distress Eyes: sclera non-icteric, normal conjunctiva and lids ENMT: normal dentition, moist mucous membranes Cardiovascular: regular rhythm, normal rate, no murmurs. S1 and S2 normal. Radial pulses normal bilaterally. No jugular venous distention.  Respiratory: clear to auscultation bilaterally GI : normal bowel sounds, soft and nontender. No distention.   MSK: extremities warm, well perfused.  No edema.  NEURO: grossly nonfocal exam, moves all extremities. PSYCH: alert and oriented x 3, normal mood and affect.   ASSESSMENT:    1. NSTEMI (non-ST elevated myocardial infarction) ([place])   2. Ischemic cardiomyopathy   3. Coronary artery disease involving native coronary artery of native heart without angina pectoris  4. Hyperlipidemia, unspecified hyperlipidemia type     PLAN:    NSTEMI 08/06/2019 Stent in ostial LAD Ischemic cardiomyopathy with recovery of EF after revascularization and heart failure therapy -Continue aspirin 81 mg indefinitely -We have reduced the dose of Brilinta to 60 mg twice daily and agreed in shared decision making to continue this for at least 24 to 36 months.  At that time we will reevaluate for cessation and likely continue aspirin and monotherapy.  He occasionally has procedures requiring hold of Brilinta.  We have discussed that prior to his next colonoscopy for polyp removal he can hold his Brilinta 60 mg twice daily for 5 days prior and resume as soon as safe from a GI perspective. -Continue Entresto and Coreg, continue statin -Repeat echo at follow-up in 1 year.  Hyperlipidemia- -We will refer to lipid clinic for further management of LDL greater than 70.  I think he would be a great candidate for PCSK9 inhibitors.  Total time of encounter: 30 minutes total time of encounter, including 20 minutes spent in face-to-face patient care on the date of this encounter.  This time includes coordination of care and counseling regarding above mentioned problem list. Remainder of non-face-to-face time involved reviewing chart documents/testing relevant to the patient encounter and documentation in the medical record. I have independently reviewed documentation from referring provider.    Cherlynn Kaiser, MD, [facility]    Medication Adjustments/Labs and Tests Ordered: Current medicines are reviewed at length with the patient today.  Concerns regarding medicines are outlined above.   Orders Placed This Encounter  Procedures   EKG 12-Lead   ECHOCARDIOGRAM COMPLETE     No orders of the defined types were placed in this encounter.   Patient Instructions  Medication Instructions:  No Changes In Medications at this time.  *If you need a refill on your cardiac medications before your next appointment, please call your pharmacy*  Testing/Procedures: Your physician has requested that you have an echocardiogram. Echocardiography is a painless test that uses sound waves to create images of your heart. It provides your doctor with information about the size and shape of your heart and how well your heart's chambers and valves are working. You may receive an ultrasound enhancing agent through an IV if needed to better visualize your heart during the echo.This procedure takes approximately one hour. There are no restrictions for this procedure. This will take place at the [address].   Follow-Up: At [facility], you and your health needs are our priority.  As part of our continuing mission to provide you with exceptional heart care, we have created designated Provider Care Teams.  These Care Teams include your primary Cardiologist (physician) and Advanced Practice Providers (APPs -  Physician Assistants and Nurse Practitioners) who all work together to provide you with the care you need, when you need it.  Your next  appointment:   1 year(s)  The format for your next appointment:   In Person  Provider:   Cherlynn Kaiser, MD  Other Instructions [place] (KRISTEN) TO DISCUSS LIPIDS/PCSK-9 at [place]    I,Mathew Stumpf,acting as a scribe for Elouise Munroe, MD.,have documented all relevant documentation on the behalf of Elouise Munroe, MD,as directed by  Elouise Munroe, MD while in the presence of Elouise Munroe, MD.  I, Elouise Munroe, MD, have reviewed all documentation for this visit. The documentation on 04/01/21 for the  exam, diagnosis, procedures, and orders are all accurate and complete.

## 2021-04-08 ENCOUNTER — Telehealth: Payer: Self-pay | Admitting: Gastroenterology

## 2021-04-08 NOTE — Telephone Encounter (Signed)
I have spoken with Dr. Alessandra Bevels about this patient. Please schedule patient for next available clinic visit to discuss large polyp resection. Okay to overbook or use my help slots. Please move forward with allowing the patient to have a scheduled date for procedure as well (colonoscopy with EMR 90-minute slot).  If he wants to be seen in clinic first before scheduling that is fine as well it would just push out the date of his procedure. Please let Dr. Alessandra Bevels and I know when he is scheduled for his clinic visit. Thanks. GM

## 2021-04-08 NOTE — Telephone Encounter (Signed)
Hi Dr. Rush Landmark, we have received a referral Dr. Alessandra Bevels with [facility] for removal of large Tubulovillous adenoma. Records will be sent to you for review. Please advise on scheduling. Thank you.

## 2021-04-09 ENCOUNTER — Telehealth: Payer: Self-pay

## 2021-04-09 ENCOUNTER — Other Ambulatory Visit: Payer: Self-pay

## 2021-04-09 DIAGNOSIS — Z86010 Personal history of colonic polyps: Secondary | ICD-10-CM

## 2021-04-09 MED ORDER — PEG 3350-KCL-NA BICARB-NACL 420 G PO SOLR: 4000.0000 mL | Freq: Once | ORAL | 0 refills | Status: AC

## 2021-04-09 NOTE — Telephone Encounter (Signed)
Colon EMR scheduled, pt instructed and medications reviewed.  Patient instructions mailed to home.  Patient to call with any questions or concerns.  Office visit appt information also provided to the pt.

## 2021-04-09 NOTE — Telephone Encounter (Signed)
[facility] Pre-operative Risk Assessment     Request for surgical clearance:     Endoscopy Procedure  What type of surgery is being performed?     Colon EMR   When is this surgery scheduled?     05/27/21  What type of clearance is required ?   Pharmacy  Are there any medications that need to be held prior to surgery and how long? [place] name and name of physician performing surgery?      [facility]  What is your office phone and fax number?      Phone- [phone]  [phone]  Anesthesia type (None, local, MAC, general) ?       MAC

## 2021-04-09 NOTE — Telephone Encounter (Signed)
The pt has been scheduled for office visit on 05/14/21 at 150 pm with GM Colon EMR scheduled for 05/27/21 at 830 am at [facility] with GM  Anti coag letter sent to Dr Margaretann Loveless

## 2021-04-09 NOTE — Telephone Encounter (Signed)
Patty, Thanks for update. GM

## 2021-04-09 NOTE — Telephone Encounter (Signed)
    Patient Name: Jeff Barnes  DOB: 02-01-1971 MRN: [id number]  Primary Cardiologist: Elouise Munroe, MD  Chart reviewed as part of pre-operative protocol coverage. Given past medical history and time since last visit, based on ACC/AHA guidelines, [name] would be at acceptable risk for the planned procedure without further cardiovascular testing.   Pt recently cleared for colonoscopy and held [place] for 5 days prior and continued ASA throughout the perioperative process without complication. He is scheduled for colon EMR. Interrupting DAPT >1 year after proximal stent involves low, but not zero risk of MACE for procedures.    The patient was advised that if he develops new symptoms prior to surgery to contact our office to arrange for a follow-up visit, and he verbalized understanding.  I will route this recommendation to the requesting party via Epic fax function and remove from pre-op pool.  Please call with questions.  Kathyrn Drown, NP 04/09/2021, 11:46 AM

## 2021-04-12 ENCOUNTER — Telehealth: Payer: Self-pay | Admitting: Gastroenterology

## 2021-04-12 NOTE — Telephone Encounter (Signed)
Pls call pt. He needs to r/s procedure that is scheduled at [facility] on 05/27/21.

## 2021-04-12 NOTE — Telephone Encounter (Signed)
The pt has been advised that he can discuss what dates are available at the time of his office visit.  The pt has been advised of the information and verbalized understanding.

## 2021-04-13 NOTE — Telephone Encounter (Signed)
I had spoken with the pt and scheduled him for 9/22 at 845 am for Colon EMR.  Anti coag letter sent and instructions written and mailed to the pt. Prep and ambulatory referral was entered. The pt states that he is not sure of his schedule and wanted to wait and set up appt at his office visit. I explained that the appts go very fast and it is better to have the date and change it if needed at his office visit.  The pt did call back and wanted to move his date however, he was still not sure when he could have this done due to his schedule.  He is going to be on vacation and has other appts.  He was going to discuss at his office visit on 9/9. However, I called the pt back today and asked if he could do 9/26 at 945 am.  He was not excited about the time but he did agree to the date.  I will mail new instructions to his home.  He will keep appt for 9/9 per the request of Dr Rush Landmark.  See note below.    "Please schedule patient for next available clinic visit to discuss large polyp resection. Okay to overbook or use my help slots. Please move forward with allowing the patient to have a scheduled date for procedure as well (colonoscopy with EMR 90-minute slot).  If he wants to be seen in clinic first before scheduling that is fine as well it would just push out the date of his procedure. Please let Dr. Alessandra Bevels and I know when he is scheduled for his clinic visit. Thanks. GM"

## 2021-04-14 ENCOUNTER — Ambulatory Visit: Payer: 59

## 2021-05-03 ENCOUNTER — Ambulatory Visit: Payer: 59 | Admitting: Pharmacist

## 2021-05-03 ENCOUNTER — Other Ambulatory Visit: Payer: Self-pay

## 2021-05-03 DIAGNOSIS — I2510 Atherosclerotic heart disease of native coronary artery without angina pectoris: Secondary | ICD-10-CM | POA: Diagnosis not present

## 2021-05-03 DIAGNOSIS — I214 Non-ST elevation (NSTEMI) myocardial infarction: Secondary | ICD-10-CM

## 2021-05-03 NOTE — Progress Notes (Signed)
Patient ID: Jeff Barnes                 DOB: [birth year]                    MRN: [id number]     HPI: Jeff Barnes is a 50 y.o. male patient referred to lipid clinic by Dr Margaretann Loveless. PMH is significant for NSTEMI, CAD, cardiomyopathy, and HTN.  Patinet presents today after arriving from another appointment where he was to be assessed for surgical removal of growth on his arm.  However the wait was too long and he had to leave. Has a repeat colonoscopy coming up which needs to be completed inpatient since a biopsy needs to be performed. Feels like he is slightly overwhelmed from all his medications and procedures.  Is currently managed on atorvastatin '80mg'$  without any adverse effects. However would like to update lab work since last lipid panel was in November 2021.  Is physically active outside. Frequently busy in his yard and often goes hiking.  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  Reports he follows a good diet but did not go into details. Thinks he has a strong family history of hyperlipidemia.    Current Medications: atorvastatin '80mg'$ ,  Intolerances: n/a Risk Factors: Hx of NSTEMI, HTN, family history LDL goal: <55  Exercise: Hiking, yardwork, mild exercise  Labs: TC 140, Trigs 53, HDL 48, LDL 81 (07/08/20 - on atorvastatin '80mg'$ )  Past Medical History:  Diagnosis Date   Cancer ([place])    skin cancer on left face   Coronary artery disease    GERD (gastroesophageal reflux disease)    H/O scarlet fever    Myocardial infarction [facility])    November   Pneumonia     Current Outpatient Medications on File Prior to Visit  Medication Sig Dispense Refill   aspirin 81 MG chewable tablet Chew 1 tablet (81 mg total) by mouth daily. 90 tablet 3   atorvastatin (LIPITOR) 80 MG tablet Take 1 tablet (80 mg total) by mouth daily at 6 PM. 90 tablet 3   BISACODYL 5 MG EC tablet See admin instructions.     carvedilol (COREG) 6.25 MG tablet Take 1 tablet (6.25 mg  total) by mouth 2 (two) times daily with a meal. 180 tablet 3   esomeprazole (NEXIUM) 20 MG capsule Take 20 mg by mouth daily.      Fexofenadine HCl (ALLEGRA PO) Take 180 mg by mouth as needed.      hyoscyamine (LEVSIN SL) 0.125 MG SL tablet Place under the tongue 3 (three) times daily as needed.     linaclotide (LINZESS) 145 MCG CAPS capsule Take 145 mcg by mouth daily before breakfast.     naphazoline-pheniramine (NAPHCON-A) 0.025-0.3 % ophthalmic solution Place 1 drop into both eyes 4 (four) times daily as needed for eye irritation or allergies.     nitroGLYCERIN (NITROSTAT) 0.4 MG SL tablet Place 1 tablet (0.4 mg total) under the tongue every 5 (five) minutes x 3 doses as needed for chest pain. 25 tablet 1   polyethylene glycol-electrolytes (NULYTELY) 420 g solution Take by mouth.     sacubitril-valsartan (ENTRESTO) 24-26 MG Take 1 tablet by mouth 2 (two) times daily. 180 tablet 3   scopolamine (TRANSDERM-SCOP) 1 MG/3DAYS 1 patch every 3 (three) days.     sildenafil (VIAGRA) 100 MG tablet Take 100 mg by mouth as needed.     ticagrelor (BRILINTA) 60 MG TABS  tablet Take 1 tablet (60 mg total) by mouth 2 (two) times daily. 180 tablet 3   No current facility-administered medications on file prior to visit.    No Known Allergies  Assessment/Plan:  1. Hyperlipidemia - Patient LDL 81 which is above goal of <55, however results are from November 2021. Patient would like to update before starting any new medications.  Since he was in office, explained the next treatment step which would be Repatha.  Using demo pen, explained mechanism of action, storage, site selection, administration, and possible side effects. Demonstrated in room but patient did not want to try himself.  Placed lab order and will contact patient with results.  If willing to start, will complete PA and activate copay card.  Patient voiced understanding.  Continue atorvastatin '80mg'$  daily Check lipid panel Recheck as  needed  Karren Cobble, PharmD, BCACP, CDCES, [place] [id number] [address] [place], [place] [address] Phone: [phone]; Fax: [phone] 05/03/2021 10:54 AM

## 2021-05-03 NOTE — Patient Instructions (Addendum)
It was nice meeting you today  We would like your LDL (bad cholesterol) to be less than 55  Continue your atorvastatin 80 mg daily  We will update your lipid panel to get your baseline levels  When we have the results, you can decide if you want to start the [place] which would be once every 2 weeks  If you do start the new medication, we will recheck your cholesterol in 2-3 months  Please call with any questions  Karren Cobble, PharmD, BCACP, [place], [place] [id number], [place],  [address] Phone: [phone]; Fax: [phone] 05/03/2021 10:31 AM

## 2021-05-12 ENCOUNTER — Telehealth: Payer: Self-pay | Admitting: *Deleted

## 2021-05-12 NOTE — Telephone Encounter (Signed)
   [facility] Pre-operative Risk Assessment    Patient Name: Jeff Barnes  DOB: 10/06/1970 MRN: [id number]  Request for surgical clearance:  What type of surgery is being performed? EXCISION LIPOMA LEFT FOREARM   When is this surgery scheduled?   What type of clearance is required (medical clearance vs. Pharmacy clearance to hold med vs. Both)? BOTH  Are there any medications that need to be held prior to surgery and how long? ASA AND [place] name and name of physician performing surgery? [place]   What is the office phone number? [phone]   7.   What is the office fax number? [place]   Anesthesia type (None, local, MAC, general) ?

## 2021-05-13 NOTE — Telephone Encounter (Addendum)
  Pt was last seen on 04/01/21 by Dr. Margaretann Loveless and was doing well at that time.   Per Dr. Delphina Cahill note on 04/01/21, he may hold brilinta for 5 days prior to procedure. Given the patient's CAD, we prefer to continue ASA throughout the perioperative period. However, if doing so significantly increases morbidity or mortality, may hold for 5-7 days.  Left VM

## 2021-05-14 ENCOUNTER — Encounter: Payer: Self-pay | Admitting: Gastroenterology

## 2021-05-14 ENCOUNTER — Ambulatory Visit: Payer: 59 | Admitting: Gastroenterology

## 2021-05-14 VITALS — BP 108/66 | HR 70 | Ht 71.0 in | Wt 176.0 lb

## 2021-05-14 DIAGNOSIS — Z7902 Long term (current) use of antithrombotics/antiplatelets: Secondary | ICD-10-CM | POA: Diagnosis not present

## 2021-05-14 DIAGNOSIS — R933 Abnormal findings on diagnostic imaging of other parts of digestive tract: Secondary | ICD-10-CM

## 2021-05-14 DIAGNOSIS — D124 Benign neoplasm of descending colon: Secondary | ICD-10-CM

## 2021-05-14 DIAGNOSIS — K635 Polyp of colon: Secondary | ICD-10-CM

## 2021-05-14 NOTE — Patient Instructions (Addendum)
Your provider has requested that you go to the basement level for lab work before leaving today. Press "B" on the elevator. The lab is located at the first door on the left as you exit the elevator. Or you may have labs done at your Cardiologist office. Please make certain to have those results faxed to our office or sent through epic.   If you are age 49 or older, your body mass index should be between 23-30. Your Body mass index is 24.55 kg/m. If this is out of the aforementioned range listed, please consider follow up with your Primary Care Provider.  If you are age 60 or younger, your body mass index should be between 19-25. Your Body mass index is 24.55 kg/m. If this is out of the aformentioned range listed, please consider follow up with your Primary Care Provider.   __________________________________________________________  The [place] [facility] providers would like to encourage you to use [facility] to communicate with providers for non-urgent requests or questions.  Due to long hold times on the telephone, sending your provider a message by [facility] may be a faster and more efficient way to get a response.  Please allow 48 business hours for a response.  Please remember that this is for non-urgent requests.   Thank you for choosing me and [facility].  Dr. Rush Landmark

## 2021-05-17 ENCOUNTER — Encounter: Payer: Self-pay | Admitting: Gastroenterology

## 2021-05-17 DIAGNOSIS — Z7902 Long term (current) use of antithrombotics/antiplatelets: Secondary | ICD-10-CM | POA: Insufficient documentation

## 2021-05-17 DIAGNOSIS — D124 Benign neoplasm of descending colon: Secondary | ICD-10-CM | POA: Insufficient documentation

## 2021-05-17 DIAGNOSIS — R933 Abnormal findings on diagnostic imaging of other parts of digestive tract: Secondary | ICD-10-CM | POA: Insufficient documentation

## 2021-05-17 NOTE — Progress Notes (Signed)
GASTROENTEROLOGY OUTPATIENT CLINIC VISIT   Primary Care Provider Carol Ada, [place] [address] [phone]  Referring Provider Dr. Alessandra Bevels  Patient Profile: Jeff Barnes is a 50 y.o. male with a pmh significant for CAD (status post PCI on Brilinta), hyperlipidemia, prior skin cancer, GERD, colon polyps (TAs & TVA with DC polyp left in situ).  The patient presents to the [facility] for an evaluation and management of problem(s) noted below:  Problem List 1. Adenomatous polyp of descending colon   2. Abnormal colonoscopy   3. Long term (current) use of antithrombotics/antiplatelets     History of Present Illness This is patient's first GI clinic.  The patient follows with [facility] gastroenterology, Dr. Alessandra Bevels.  In July he underwent a screening colonoscopy and was found to have multiple tubular adenomas as well as a large descending colon polyp, described as a 20 mm or greater polyp that was biopsied and returned as a tubulovillous adenoma.  It is for this reason that the patient is referred for consideration of advanced resection attempt of this large adenoma.  The patient is not having any other GI issues other than what he typically deals with and sees Dr. Alessandra Bevels for.  He is on Linzess.  He is on a PPI.  The patient is hopeful to minimize risk of surgery overall if possible.   GI Review of Systems Positive as above Negative for dysphagia, odynophagia, pain, nausea, vomiting, alteration of bowel habits  Review of Systems General: Denies fevers/chills/weight loss unintentionally Cardiovascular: Denies chest pain/palpitations Pulmonary: Denies shortness of breath Gastroenterological: See HPI Genitourinary: Denies darkened urine or hematuria Hematological: Denies easy bruising/bleeding Dermatological: Denies jaundice Psychological: Mood is stable   Medications Current Outpatient Medications  Medication  Sig Dispense Refill   aspirin 81 MG chewable tablet Chew 1 tablet (81 mg total) by mouth daily. 90 tablet 3   atorvastatin (LIPITOR) 80 MG tablet Take 1 tablet (80 mg total) by mouth daily at 6 PM. 90 tablet 3   carvedilol (COREG) 6.25 MG tablet Take 1 tablet (6.25 mg total) by mouth 2 (two) times daily with a meal. 180 tablet 3   esomeprazole (NEXIUM) 20 MG capsule Take 20 mg by mouth daily.      Fexofenadine HCl (ALLEGRA PO) Take 180 mg by mouth as needed.      hyoscyamine (LEVSIN SL) 0.125 MG SL tablet Place under the tongue 3 (three) times daily as needed.     linaclotide (LINZESS) 145 MCG CAPS capsule Take 145 mcg by mouth daily before breakfast.     nitroGLYCERIN (NITROSTAT) 0.4 MG SL tablet Place 1 tablet (0.4 mg total) under the tongue every 5 (five) minutes x 3 doses as needed for chest pain. 25 tablet 1   sacubitril-valsartan (ENTRESTO) 24-26 MG Take 1 tablet by mouth 2 (two) times daily. 180 tablet 3   sildenafil (VIAGRA) 100 MG tablet Take 100 mg by mouth as needed.     ticagrelor (BRILINTA) 60 MG TABS tablet Take 1 tablet (60 mg total) by mouth 2 (two) times daily. 180 tablet 3   No current facility-administered medications for this visit.    Allergies No Known Allergies  Histories Past Medical History:  Diagnosis Date   Cancer ([place])    skin cancer on left face   Coronary artery disease    GERD (gastroesophageal reflux disease)    H/O scarlet fever    Myocardial infarction [facility])    November  Pneumonia    Past Surgical History:  Procedure Laterality Date   BACK SURGERY     CARDIAC CATHETERIZATION     CORONARY STENT INTERVENTION  08/06/2019   CORONARY STENT INTERVENTION N/A 08/06/2019   Procedure: CORONARY STENT INTERVENTION;  Surgeon: Troy Sine, MD;  Location: [place] [facility];  Service: Cardiovascular;  Laterality: N/A;   INGUINAL HERNIA REPAIR Right 03/05/2020   Procedure: RIGHT OPEN INGUINAL HERNIA REPAIR WITH MESH;  Surgeon: Kinsinger, Arta Bruce, MD;   Location: [facility];  Service: General;  Laterality: Right;   LEFT HEART CATH AND CORONARY ANGIOGRAPHY N/A 08/06/2019   Procedure: LEFT HEART CATH AND CORONARY ANGIOGRAPHY;  Surgeon: Troy Sine, MD;  Location: [place] [facility];  Service: Cardiovascular;  Laterality: N/A;   Social History   Socioeconomic History   Marital status: Single    Spouse name: Not on file   Number of children: 2   Years of education: 16   Highest education level: Bachelor's degree (e.g., BA, AB, BS)  Occupational History   Not on file  Tobacco Use   Smoking status: Never   Smokeless tobacco: Never  Vaping Use   Vaping Use: Never used  Substance and Sexual Activity   Alcohol use: Yes    Comment: occas.   Drug use: Never   Sexual activity: Not on file  Other Topics Concern   Not on file  Social History Narrative   Not on file   Social Determinants of Health   Financial Resource Strain: Not on file  Food Insecurity: Not on file  Transportation Needs: Not on file  Physical Activity: Not on file  Stress: Not on file  Social Connections: Not on file  Intimate Partner Violence: Not on file   Family History  Problem Relation Age of Onset   Cancer Father    CAD Father    Colon cancer Neg Hx    Esophageal cancer Neg Hx    Inflammatory bowel disease Neg Hx    Liver disease Neg Hx    Pancreatic cancer Neg Hx    Stomach cancer Neg Hx    Rectal cancer Neg Hx    I have reviewed his medical, social, and family history in detail and updated the electronic medical record as necessary.    PHYSICAL EXAMINATION  BP 108/66   Pulse 70   Ht '5\' 11"'$  (1.803 m)   Wt 176 lb (79.8 kg)   BMI 24.55 kg/m  Wt Readings from Last 3 Encounters:  05/14/21 176 lb (79.8 kg)  04/01/21 173 lb (78.5 kg)  06/22/20 170 lb (77.1 kg)  GEN: NAD, appears stated age, doesn't appear chronically ill PSYCH: Cooperative, without pressured speech EYE: Conjunctivae pink, sclerae anicteric ENT: MMM CV: Nontachycardic RESP: No  audible wheezing GI: NABS, soft, NT/ND, without rebound MSK/EXT: No lower extremity edema SKIN: No jaundice NEURO:  Alert & Oriented x 3, no focal deficits, no evidence of asterixis   REVIEW OF DATA  I reviewed the following data at the time of this encounter:  GI Procedures and Studies  July 2022 colonoscopy Multiple polyps resected-tubular adenoma on final pathology Descending colon polyp greater than 20 mm-biopsied tubulovillous adenoma  Laboratory Studies  Reviewed those in epic  Imaging Studies  No relevant studies to review   ASSESSMENT  Jeff Barnes is a 50 y.o. male with a pmh significant for CAD (status post PCI on Brilinta), hyperlipidemia, prior skin cancer, GERD, colon polyps (TAs & TVA with DC polyp left  in situ).  The patient is seen today for evaluation and management of:  1. Adenomatous polyp of descending colon   2. Abnormal colonoscopy   3. Long term (current) use of antithrombotics/antiplatelets    The patient is hemodynamically and clinically stable.  Based upon the description and endoscopic pictures I do feel that it is reasonable to pursue an Advanced Polypectomy attempt of the polyp/lesion.  We discussed some of the techniques of advanced polypectomy which include Endoscopic Mucosal Resection, OVESCO Full-Thickness Resection, Endorotor Morcellation, and Tissue Ablation via Fulguration.  We also reviewed images of typical techniques as noted above.  The risks and benefits of endoscopic evaluation were discussed with the patient; these include but are not limited to the risk of perforation, infection, bleeding, missed lesions, lack of diagnosis, severe illness requiring hospitalization, as well as anesthesia and sedation related illnesses.  During attempts at advanced resection, the risks of bleeding and perforation/leak are increased as opposed to diagnostic and screening procedures, and that was discussed with the patient as well.   In addition, I explained that  with the possible need for piecemeal resection, subsequent short-interval endoscopic evaluation for follow up and potential retreatment of the lesion/area may be necessary.  I did offer, a referral to surgery in order for patient to have opportunity to discuss surgical management/intervention prior to finalizing decision for attempt at endoscopic removal, however, the patient deferred on this.  If, after attempt at removal of the polyp/lesion, it is found that the patient has a complication or that an invasive lesion or malignant lesion is found, or that the polyp/lesion continues to recur, the patient is aware and understands that surgery may still be indicated/required.  We will obtain cardiology approval for prolonged to hold for 5 days prior to procedure.  All patient questions were answered, to the best of my ability, and the patient agrees to the aforementioned plan of action with follow-up as indicated.   PLAN  Preprocedure labs to be obtained Proceed with obtaining cardiology approval for Plavix hold later this month for colonoscopy Colonoscopy is already scheduled Follow-up to be dictated based on results of colonoscopy   Orders Placed This Encounter  Procedures   Basic Metabolic Panel (BMET)   CBC   INR/PT    New Prescriptions   No medications on file   Modified Medications   No medications on file    Planned Follow Up No follow-ups on file.   Total Time in Face-to-Face and in Coordination of Care for patient including independent/personal interpretation/review of prior testing, medical history, examination, medication adjustment, communicating results with the patient directly, and documentation with the EHR is 35 minutes.   Justice Britain, MD [facility] Advanced Endoscopy Office # [phone]

## 2021-05-18 NOTE — Telephone Encounter (Signed)
Patient returning call.

## 2021-05-19 ENCOUNTER — Other Ambulatory Visit: Payer: Self-pay

## 2021-05-19 MED ORDER — PEG 3350-KCL-NA BICARB-NACL 420 G PO SOLR: 4000.0000 mL | Freq: Once | ORAL | 0 refills | Status: AC

## 2021-05-20 ENCOUNTER — Telehealth: Payer: Self-pay | Admitting: Gastroenterology

## 2021-05-20 ENCOUNTER — Other Ambulatory Visit: Payer: Self-pay

## 2021-05-20 ENCOUNTER — Other Ambulatory Visit: Payer: 59

## 2021-05-20 DIAGNOSIS — R933 Abnormal findings on diagnostic imaging of other parts of digestive tract: Secondary | ICD-10-CM

## 2021-05-20 DIAGNOSIS — D124 Benign neoplasm of descending colon: Secondary | ICD-10-CM

## 2021-05-20 DIAGNOSIS — I2510 Atherosclerotic heart disease of native coronary artery without angina pectoris: Secondary | ICD-10-CM

## 2021-05-20 DIAGNOSIS — I214 Non-ST elevation (NSTEMI) myocardial infarction: Secondary | ICD-10-CM

## 2021-05-20 LAB — BASIC METABOLIC PANEL
BUN: 13 mg/dL (ref 6–23)
CO2: 30 mEq/L (ref 19–32)
Calcium: 9.8 mg/dL (ref 8.4–10.5)
Chloride: 103 mEq/L (ref 96–112)
Creatinine, Ser: 0.83 mg/dL (ref 0.40–1.50)
GFR: 103.28 mL/min (ref >60.00)
Glucose, Bld: 70 mg/dL (ref 70–99)
Potassium: 4.6 mEq/L (ref 3.5–5.1)
Sodium: 140 mEq/L (ref 135–145)

## 2021-05-20 LAB — LIPID PANEL
Chol/HDL Ratio: 3.2 ratio (ref 0.0–5.0)
Cholesterol, Total: 138 mg/dL (ref 100–199)
HDL: 43 mg/dL (ref >39)
LDL Chol Calc (NIH): 81 mg/dL (ref 0–99)
Triglycerides: 66 mg/dL (ref 0–149)
VLDL Cholesterol Cal: 14 mg/dL (ref 5–40)

## 2021-05-20 LAB — CBC
HCT: 47.2 % (ref 39.0–52.0)
Hemoglobin: 15.5 g/dL (ref 13.0–17.0)
MCHC: 32.9 g/dL (ref 30.0–36.0)
MCV: 93.5 fl (ref 78.0–100.0)
Platelets: 206 10*3/uL (ref 150.0–400.0)
RBC: 5.05 Mil/uL (ref 4.22–5.81)
RDW: 14.4 % (ref 11.5–15.5)
WBC: 6.3 10*3/uL (ref 4.0–10.5)

## 2021-05-20 LAB — PROTIME-INR
INR: 1.1 ratio — ABNORMAL HIGH (ref 0.8–1.0)
Prothrombin Time: 12.1 s (ref 9.6–13.1)

## 2021-05-20 NOTE — Telephone Encounter (Signed)
Patient already had labs done today.

## 2021-05-20 NOTE — Telephone Encounter (Signed)
This has been addressed, see 05/19/21 patient message. Thanks

## 2021-05-21 ENCOUNTER — Encounter (HOSPITAL_COMMUNITY): Payer: Self-pay | Admitting: Gastroenterology

## 2021-05-21 ENCOUNTER — Other Ambulatory Visit: Payer: Self-pay

## 2021-05-25 NOTE — Telephone Encounter (Signed)
   Name: Jeff Barnes  DOB: 01/17/1971  MRN: [id number]   Primary Cardiologist: Elouise Munroe, MD  Chart reviewed as part of pre-operative protocol coverage. Patient was contacted 05/25/2021 in reference to pre-operative risk assessment for pending surgery as outlined below.  Jeff Barnes was last seen 03/2021 by Dr. Margaretann Loveless. I reached out to patient for update on how he is doing. The patient affirms he has been doing well without any new cardiac symptoms. He hikes and exercises regularly exceeding 4 METS without cardiac limitations. Therefore, based on ACC/AHA guidelines, the patient would be at acceptable risk for the planned procedure without further cardiovascular testing. The patient was advised that if he develops new symptoms prior to surgery to contact our office to arrange for a follow-up visit, and he verbalized understanding.  Per Dr. Delphina Cahill note on 04/01/21, he may hold brilinta for 5 days prior to procedure. Given the patient's CAD, we prefer to continue ASA throughout the perioperative period. However, if doing so significantly increases morbidity or mortality, may hold for 5-7 days.  I will route this recommendation to the requesting party via Epic fax function and remove from pre-op pool. Please call with questions.  Charlie Pitter, PA-C 05/25/2021, 3:16 PM

## 2021-05-27 ENCOUNTER — Telehealth: Payer: Self-pay | Admitting: Internal Medicine

## 2021-05-27 NOTE — Telephone Encounter (Signed)
Follow Up:    Jeff Barnes is calling from Dr Luanna Cole office. She said the patient is already cleared  for his surgery on 06-24-21. Pt is also scheduled for surgery this Monday(05-31-21). They are having him stop his Brilinta also for that procedure 5 days before 05-31-21.   Patient also will need  to stop his Brilinta 5 days before his other surgery on 06-24-21. She wants to know if this will be alright, since these surgeries are so close together?

## 2021-05-27 NOTE — Telephone Encounter (Signed)
Okay to hold brillinta for 5 days for each procedure. Other recommendations as given previously.

## 2021-05-31 ENCOUNTER — Ambulatory Visit (HOSPITAL_COMMUNITY): Payer: 59 | Admitting: Anesthesiology

## 2021-05-31 ENCOUNTER — Other Ambulatory Visit: Payer: Self-pay

## 2021-05-31 ENCOUNTER — Encounter (HOSPITAL_COMMUNITY): Payer: Self-pay | Admitting: Gastroenterology

## 2021-05-31 ENCOUNTER — Ambulatory Visit (HOSPITAL_COMMUNITY)
Admission: RE | Admit: 2021-05-31 | Discharge: 2021-05-31 | Disposition: A | Discharge status: Home / Self Care | Payer: 59 | Attending: Gastroenterology | Admitting: Gastroenterology

## 2021-05-31 ENCOUNTER — Encounter (HOSPITAL_COMMUNITY)
Admission: RE | Disposition: A | Discharge status: Home / Self Care | Payer: Self-pay | Source: Home / Self Care | Attending: Gastroenterology

## 2021-05-31 DIAGNOSIS — Z7982 Long term (current) use of aspirin: Secondary | ICD-10-CM | POA: Insufficient documentation

## 2021-05-31 DIAGNOSIS — Z7902 Long term (current) use of antithrombotics/antiplatelets: Secondary | ICD-10-CM | POA: Insufficient documentation

## 2021-05-31 DIAGNOSIS — K641 Second degree hemorrhoids: Secondary | ICD-10-CM | POA: Insufficient documentation

## 2021-05-31 DIAGNOSIS — D127 Benign neoplasm of rectosigmoid junction: Secondary | ICD-10-CM

## 2021-05-31 DIAGNOSIS — D124 Benign neoplasm of descending colon: Secondary | ICD-10-CM | POA: Insufficient documentation

## 2021-05-31 DIAGNOSIS — Z86010 Personal history of colonic polyps: Secondary | ICD-10-CM

## 2021-05-31 DIAGNOSIS — Z79899 Other long term (current) drug therapy: Secondary | ICD-10-CM | POA: Insufficient documentation

## 2021-05-31 DIAGNOSIS — K644 Residual hemorrhoidal skin tags: Secondary | ICD-10-CM | POA: Insufficient documentation

## 2021-05-31 DIAGNOSIS — K635 Polyp of colon: Secondary | ICD-10-CM | POA: Diagnosis present

## 2021-05-31 HISTORY — PX: HEMOSTASIS CLIP PLACEMENT: SHX6857

## 2021-05-31 HISTORY — PX: POLYPECTOMY: SHX5525

## 2021-05-31 HISTORY — PX: COLONOSCOPY WITH PROPOFOL: SHX5780

## 2021-05-31 HISTORY — PX: ENDOSCOPIC MUCOSAL RESECTION: SHX6839

## 2021-05-31 HISTORY — PX: SUBMUCOSAL LIFTING INJECTION: SHX6855

## 2021-05-31 SURGERY — COLONOSCOPY WITH PROPOFOL
Anesthesia: Monitor Anesthesia Care

## 2021-05-31 MED ORDER — PROPOFOL 10 MG/ML IV BOLUS
INTRAVENOUS | Status: DC | PRN
  Administered 2021-05-31: 30 mg via INTRAVENOUS
  Administered 2021-05-31 (×2): 20 mg via INTRAVENOUS
  Administered 2021-05-31: 30 mg via INTRAVENOUS

## 2021-05-31 MED ORDER — LACTATED RINGERS IV SOLN: INTRAVENOUS | Status: DC | PRN

## 2021-05-31 MED ORDER — SODIUM CHLORIDE 0.9 % IV SOLN: INTRAVENOUS | Status: DC

## 2021-05-31 MED ORDER — PROPOFOL 500 MG/50ML IV EMUL
INTRAVENOUS | Status: DC | PRN
  Administered 2021-05-31: 125 ug/kg/min via INTRAVENOUS

## 2021-05-31 MED ORDER — PROPOFOL 1000 MG/100ML IV EMUL
INTRAVENOUS | Status: AC
  Filled 2021-05-31: qty 100

## 2021-05-31 MED ORDER — LIDOCAINE 2% (20 MG/ML) 5 ML SYRINGE
INTRAMUSCULAR | Status: DC | PRN
  Administered 2021-05-31: 60 mg via INTRAVENOUS

## 2021-05-31 MED ORDER — TICAGRELOR 60 MG PO TABS: 60.0000 mg | ORAL_TABLET | Freq: Two times a day (BID) | ORAL | 3 refills | Status: DC

## 2021-05-31 SURGICAL SUPPLY — 22 items

## 2021-05-31 NOTE — Transfer of Care (Signed)
Immediate Anesthesia Transfer of Care Note  Patient: Jeff Barnes  Procedure(s) Performed: COLONOSCOPY WITH PROPOFOL ENDOSCOPIC MUCOSAL RESECTION HEMOSTASIS CLIP PLACEMENT SUBMUCOSAL LIFTING INJECTION POLYPECTOMY  Patient Location: PACU and Endoscopy Unit  Anesthesia Type:MAC  Level of Consciousness: awake and drowsy  Airway & Oxygen Therapy: Patient Spontanous Breathing and Patient connected to face mask oxygen  Post-op Assessment: Report given to RN and Post -op Vital signs reviewed and stable  Post vital signs: Reviewed and stable  Last Vitals:  Vitals Value Taken Time  BP    Temp    Pulse    Resp    SpO2      Last Pain:  Vitals:   05/31/21 0907  TempSrc: Axillary  PainSc: 0-No pain         Complications: No notable events documented.

## 2021-05-31 NOTE — H&P (Signed)
GASTROENTEROLOGY PROCEDURE H&P NOTE   Primary Care Physician: Mansouraty, Telford Nab., MD  HPI: Jeff Barnes is a 50 y.o. male who presents for Colonoscopy with attempt at EMR of [facility] [place].  Past Medical History:  Diagnosis Date   Cancer [facility])    skin cancer on left face   Coronary artery disease    GERD (gastroesophageal reflux disease)    H/O scarlet fever    Myocardial infarction [facility])    November   Pneumonia    Past Surgical History:  Procedure Laterality Date   BACK SURGERY     CARDIAC CATHETERIZATION     CORONARY STENT INTERVENTION  08/06/2019   CORONARY STENT INTERVENTION N/A 08/06/2019   Procedure: CORONARY STENT INTERVENTION;  Surgeon: Troy Sine, MD;  Location: [place] [facility];  Service: Cardiovascular;  Laterality: N/A;   INGUINAL HERNIA REPAIR Right 03/05/2020   Procedure: RIGHT OPEN INGUINAL HERNIA REPAIR WITH MESH;  Surgeon: Kinsinger, Arta Bruce, MD;  Location: [facility];  Service: General;  Laterality: Right;   LEFT HEART CATH AND CORONARY ANGIOGRAPHY N/A 08/06/2019   Procedure: LEFT HEART CATH AND CORONARY ANGIOGRAPHY;  Surgeon: Troy Sine, MD;  Location: [place] [facility];  Service: Cardiovascular;  Laterality: N/A;   No current facility-administered medications for this encounter.   Current Outpatient Medications  Medication Sig Dispense Refill   acetaminophen (TYLENOL) 500 MG tablet Take 1,000 mg by mouth every 6 (six) hours as needed (for pain).     aspirin 81 MG chewable tablet Chew 1 tablet (81 mg total) by mouth daily. 90 tablet 3   atorvastatin (LIPITOR) 80 MG tablet Take 1 tablet (80 mg total) by mouth daily at 6 PM. 90 tablet 3   carvedilol (COREG) 6.25 MG tablet Take 1 tablet (6.25 mg total) by mouth 2 (two) times daily with a meal. 180 tablet 3   esomeprazole (NEXIUM) 20 MG capsule Take 20 mg by mouth daily.      fexofenadine (ALLEGRA) 180 MG tablet Take 180 mg by mouth in the morning.     hyoscyamine (LEVSIN SL) 0.125 MG SL  tablet Place 0.125 mg under the tongue 3 (three) times daily as needed for cramping.     linaclotide (LINZESS) 145 MCG CAPS capsule Take 145 mcg by mouth daily as needed (constipation.).     nitroGLYCERIN (NITROSTAT) 0.4 MG SL tablet Place 1 tablet (0.4 mg total) under the tongue every 5 (five) minutes x 3 doses as needed for chest pain. 25 tablet 1   sacubitril-valsartan (ENTRESTO) 24-26 MG Take 1 tablet by mouth 2 (two) times daily. 180 tablet 3   sildenafil (VIAGRA) 100 MG tablet Take 100 mg by mouth daily as needed for erectile dysfunction.     tetrahydrozoline 0.05 % ophthalmic solution Place 1 drop into both eyes 3 (three) times daily as needed (dry/irritated eyes).     polyethylene glycol-electrolytes (NULYTELY) 420 g solution Take 4,000 mLs by mouth as directed.     ticagrelor (BRILINTA) 60 MG TABS tablet Take 1 tablet (60 mg total) by mouth 2 (two) times daily. 180 tablet 3   No current facility-administered medications for this encounter.  Current Outpatient Medications:    acetaminophen (TYLENOL) 500 MG tablet, Take 1,000 mg by mouth every 6 (six) hours as needed (for pain)., Disp: , Rfl:    aspirin 81 MG chewable tablet, Chew 1 tablet (81 mg total) by mouth daily., Disp: 90 tablet, Rfl: 3   atorvastatin (LIPITOR) 80 MG tablet,  Take 1 tablet (80 mg total) by mouth daily at 6 PM., Disp: 90 tablet, Rfl: 3   carvedilol (COREG) 6.25 MG tablet, Take 1 tablet (6.25 mg total) by mouth 2 (two) times daily with a meal., Disp: 180 tablet, Rfl: 3   esomeprazole (NEXIUM) 20 MG capsule, Take 20 mg by mouth daily. , Disp: , Rfl:    fexofenadine (ALLEGRA) 180 MG tablet, Take 180 mg by mouth in the morning., Disp: , Rfl:    hyoscyamine (LEVSIN SL) 0.125 MG SL tablet, Place 0.125 mg under the tongue 3 (three) times daily as needed for cramping., Disp: , Rfl:    linaclotide (LINZESS) 145 MCG CAPS capsule, Take 145 mcg by mouth daily as needed (constipation.)., Disp: , Rfl:    nitroGLYCERIN (NITROSTAT)  0.4 MG SL tablet, Place 1 tablet (0.4 mg total) under the tongue every 5 (five) minutes x 3 doses as needed for chest pain., Disp: 25 tablet, Rfl: 1   sacubitril-valsartan (ENTRESTO) 24-26 MG, Take 1 tablet by mouth 2 (two) times daily., Disp: 180 tablet, Rfl: 3   sildenafil (VIAGRA) 100 MG tablet, Take 100 mg by mouth daily as needed for erectile dysfunction., Disp: , Rfl:    tetrahydrozoline 0.05 % ophthalmic solution, Place 1 drop into both eyes 3 (three) times daily as needed (dry/irritated eyes)., Disp: , Rfl:    polyethylene glycol-electrolytes (NULYTELY) 420 g solution, Take 4,000 mLs by mouth as directed., Disp: , Rfl:    ticagrelor (BRILINTA) 60 MG TABS tablet, Take 1 tablet (60 mg total) by mouth 2 (two) times daily., Disp: 180 tablet, Rfl: 3 No Known Allergies Family History  Problem Relation Age of Onset   Cancer Father    CAD Father    Colon cancer Neg Hx    Esophageal cancer Neg Hx    Inflammatory bowel disease Neg Hx    Liver disease Neg Hx    Pancreatic cancer Neg Hx    Stomach cancer Neg Hx    Rectal cancer Neg Hx    Social History   Socioeconomic History   Marital status: Single    Spouse name: Not on file   Number of children: 2   Years of education: 16   Highest education level: Bachelor's degree (e.g., BA, AB, BS)  Occupational History   Not on file  Tobacco Use   Smoking status: Never   Smokeless tobacco: Never  Vaping Use   Vaping Use: Never used  Substance and Sexual Activity   Alcohol use: Yes    Comment: occas.   Drug use: Never   Sexual activity: Not on file  Other Topics Concern   Not on file  Social History Narrative   Not on file   Social Determinants of Health   Financial Resource Strain: Not on file  Food Insecurity: Not on file  Transportation Needs: Not on file  Physical Activity: Not on file  Stress: Not on file  Social Connections: Not on file  Intimate Partner Violence: Not on file    Physical Exam: Today's Vitals    05/21/21 1244  Weight: 78.9 kg  Height: 5\' 11"  (1.803 m)   Body mass index is 24.27 kg/m. GEN: NAD EYE: Sclerae anicteric ENT: MMM CV: Non-tachycardic GI: Soft, NT/ND NEURO:  Alert & Oriented x 3  Lab Results: No results for input(s): WBC, HGB, HCT, PLT in the last 72 hours. BMET No results for input(s): NA, K, CL, CO2, GLUCOSE, BUN, CREATININE, CALCIUM in the last 72 hours. LFT No  results for input(s): PROT, ALBUMIN, AST, ALT, ALKPHOS, BILITOT, BILIDIR, IBILI in the last 72 hours. PT/INR No results for input(s): LABPROT, INR in the last 72 hours.   Impression / Plan: This is a 50 y.o.male who presents for Colonoscopy with attempt at EMR of [facility] [place].  The risks and benefits of endoscopic evaluation/treatment were discussed with the patient and/or family; these include but are not limited to the risk of perforation, infection, bleeding, missed lesions, lack of diagnosis, severe illness requiring hospitalization, as well as anesthesia and sedation related illnesses.  The patient's history has been reviewed, patient examined, no change in status, and deemed stable for procedure.  The patient and/or family is agreeable to proceed.    Justice Britain, MD [facility] Office # [phone]

## 2021-05-31 NOTE — Anesthesia Postprocedure Evaluation (Signed)
Anesthesia Post Note  Patient: Jeff Barnes  Procedure(s) Performed: COLONOSCOPY WITH PROPOFOL ENDOSCOPIC MUCOSAL RESECTION HEMOSTASIS CLIP PLACEMENT SUBMUCOSAL LIFTING INJECTION POLYPECTOMY     Patient location during evaluation: Endoscopy Anesthesia Type: MAC Level of consciousness: awake and alert Pain management: pain level controlled Vital Signs Assessment: post-procedure vital signs reviewed and stable Respiratory status: spontaneous breathing, nonlabored ventilation and respiratory function stable Cardiovascular status: stable and blood pressure returned to baseline Postop Assessment: no apparent nausea or vomiting Anesthetic complications: no   No notable events documented.  Last Vitals:  Vitals:   05/31/21 1048 05/31/21 1050  BP:  115/78  Pulse:  69  Resp:  15  Temp: 36.5 C   SpO2:  100%    Last Pain:  Vitals:   05/31/21 1050  TempSrc:   PainSc: 0-No pain                 Chenelle Benning,W. EDMOND

## 2021-05-31 NOTE — Op Note (Signed)
[facility] Patient Name: Jeff Barnes Procedure Date: 05/31/2021 MRN: [id number] Attending MD: Justice Britain , MD Date of Birth: 07-29-1971 CSN: [id number] Age: 50 Admit Type: Outpatient Procedure:                Colonoscopy Indications:              Excision of colonic polyp Providers:                Justice Britain, MD, Jeanella Cara, RN,                            Tyrone Apple, Technician, Luan Moore,                            Technician, Clarence Zenia Resides CRNA, CRNA Referring MD:             Otis Brace, MD, Judithann Sauger MD, MD Medicines:                Monitored Anesthesia Care Complications:            No immediate complications. Estimated Blood Loss:     Estimated blood loss was minimal. Procedure:                Pre-Anesthesia Assessment:                           - Prior to the procedure, a History and Physical                            was performed, and patient medications and                            allergies were reviewed. The patient's tolerance of                            previous anesthesia was also reviewed. The risks                            and benefits of the procedure and the sedation                            options and risks were discussed with the patient.                            All questions were answered, and informed consent                            was obtained. Prior Anticoagulants: The patient has                            taken Brilanta, last dose was 5 days prior to                            procedure. ASA Grade Assessment: II - A patient  with mild systemic disease. After reviewing the                            risks and benefits, the patient was deemed in                            satisfactory condition to undergo the procedure.                           After obtaining informed consent, the colonoscope                            was passed under  direct vision. Throughout the                            procedure, the patient's blood pressure, pulse, and                            oxygen saturations were monitored continuously. The                            CF-HQ190L ([id number]) Olympus colonoscope was                            introduced through the anus and advanced to the 5                            cm into the ileum. The colonoscopy was performed                            without difficulty. The patient tolerated the                            procedure. The quality of the bowel preparation was                            adequate. The terminal ileum, ileocecal valve,                            appendiceal orifice, and rectum were photographed. Scope In: 2:29:79 AM Scope Out: 10:24:42 AM Scope Withdrawal Time: 0 hours 44 minutes 3 seconds  Total Procedure Duration: 0 hours 47 minutes 55 seconds  Findings:      The digital rectal exam findings include hemorrhoids. Pertinent       negatives include no palpable rectal lesions.      The terminal ileum and ileocecal valve appeared normal.      A 30 mm polyp was found in the descending colon. The polyp was granular       lateral spreading. Tattoo was present underneath completely.       Preparations were made for mucosal resection. NBI imaging and       White-light endoscopy was done to demarcate the borders of the lesion.       Orise gel was injected to raise the lesion. Piecemeal mucosal resection  using a snare was performed. Resection and retrieval were complete.       Coagulation for tissue destruction using snare tip soft cautery was       successful to the margin. To prevent bleeding after mucosal resection,       eight hemostatic clips were successfully placed (MR conditional). There       was no bleeding during, or at the end, of the procedure.      A 3 mm polyp was found in the recto-sigmoid colon. The polyp was       sessile. The polyp was removed with a cold snare.  Resection and       retrieval were complete.      Normal mucosa was found in the entire colon otherwise.      Non-bleeding non-thrombosed external and internal hemorrhoids were found       during retroflexion, during perianal exam and during digital exam. The       hemorrhoids were Grade II (internal hemorrhoids that prolapse but reduce       spontaneously). Impression:               - Hemorrhoids found on digital rectal exam.                           - The examined portion of the ileum was normal.                           - One 30 mm polyp in the descending colon, removed                            with mucosal resection. Resected and retrieved.                            Treated with STSC to margin. Clips (MR conditional)                            were placed.                           - One 3 mm polyp at the recto-sigmoid colon,                            removed with a cold snare. Resected and retrieved.                           - Normal mucosa in the entire examined colon                            otherwise.                           - Non-bleeding non-thrombosed external and internal                            hemorrhoids. Moderate Sedation:      Not Applicable - Patient had care per Anesthesia. Recommendation:           - The patient will be observed post-procedure,  until all discharge criteria are met.                           - Discharge patient to home.                           - Patient has a contact number available for                            emergencies. The signs and symptoms of potential                            delayed complications were discussed with the                            patient. Return to normal activities tomorrow.                            Written discharge instructions were provided to the                            patient.                           - High fiber diet.                           - Use FiberCon 1-2  tablets PO daily.                           - May restart Brilanta on 9/29 AM.                           - OK to resume Aspirin on 9/27.                           - Continue present medications.                           - Await pathology results.                           - Repeat colonoscopy within next 6-9 months for                            surveillance after piecemeal resection.                           - The findings and recommendations were discussed                            with the patient.                           - The findings and recommendations were discussed  with the patient's family. Procedure Code(s):        --- Professional ---                           [phone], Colonoscopy, flexible; with endoscopic                            mucosal resection                           [id number], 24, Colonoscopy, flexible; with removal of                            tumor(s), polyp(s), or other lesion(s) by snare                            technique Diagnosis Code(s):        --- Professional ---                           K64.1, Second degree hemorrhoids                           K63.5, Polyp of colon CPT copyright 2019 [employer] Association. All rights reserved. The codes documented in this report are preliminary and upon coder review may  be revised to meet current compliance requirements. Justice Britain, MD 05/31/2021 10:57:36 AM Number of Addenda: 0

## 2021-05-31 NOTE — Anesthesia Procedure Notes (Signed)
Procedure Name: MAC Date/Time: 05/31/2021 9:21 AM Performed by: Lollie Sails, CRNA Pre-anesthesia Checklist: Patient identified, Emergency Drugs available, Suction available, Patient being monitored and Timeout performed Oxygen Delivery Method: Simple face mask Placement Confirmation: positive ETCO2

## 2021-05-31 NOTE — Anesthesia Preprocedure Evaluation (Addendum)
Anesthesia Evaluation  Patient identified by MRN, date of birth, ID band Patient awake    Reviewed: Allergy & Precautions, H&P , NPO status , Patient's Chart, lab work & pertinent test results, reviewed documented beta blocker date and time   Airway Mallampati: III  TM Distance: >3 FB Neck ROM: Full    Dental no notable dental hx. (+) Teeth Intact, Dental Advisory Given   Pulmonary neg pulmonary ROS,    Pulmonary exam normal breath sounds clear to auscultation       Cardiovascular hypertension, Pt. on medications and Pt. on home beta blockers + CAD, + Past MI and + Cardiac Stents   Rhythm:Regular Rate:Normal     Neuro/Psych negative neurological ROS  negative psych ROS   GI/Hepatic Neg liver ROS, GERD  Medicated,  Endo/Other  negative endocrine ROS  Renal/GU negative Renal ROS  negative genitourinary   Musculoskeletal   Abdominal   Peds  Hematology negative hematology ROS (+)   Anesthesia Other Findings   Reproductive/Obstetrics negative OB ROS                            Anesthesia Physical Anesthesia Plan  ASA: 3  Anesthesia Plan: MAC   Post-op Pain Management:    Induction: Intravenous  PONV Risk Score and Plan: 2 and Propofol infusion and Treatment may vary due to age or medical condition  Airway Management Planned: Simple Face Mask  Additional Equipment:   Intra-op Plan:   Post-operative Plan:   Informed Consent: I have reviewed the patients History and Physical, chart, labs and discussed the procedure including the risks, benefits and alternatives for the proposed anesthesia with the patient or authorized representative who has indicated his/her understanding and acceptance.     Dental advisory given  Plan Discussed with: CRNA  Anesthesia Plan Comments:         Anesthesia Quick Evaluation

## 2021-06-01 ENCOUNTER — Encounter: Payer: Self-pay | Admitting: Gastroenterology

## 2021-06-01 LAB — SURGICAL PATHOLOGY

## 2021-06-02 ENCOUNTER — Encounter (HOSPITAL_COMMUNITY): Payer: Self-pay | Admitting: Gastroenterology

## 2021-09-08 ENCOUNTER — Encounter: Payer: Self-pay | Admitting: Internal Medicine

## 2021-09-08 NOTE — Telephone Encounter (Signed)
Returned call to patient with complaints of sensations in chest and arms. Scheduled patient to see DOD (Dr. Gardiner Rhyme) tomorrow at 11:30am. Made patient aware of ED precautions should new or worsening symptoms develop. Patient verbalized understanding.

## 2021-09-08 NOTE — Telephone Encounter (Signed)
Spoke with patient who reports that for the past week he gets numbness in his arms. Nothing relieves or makes it worse. He also gets a "sensation in his chest"; however, these do not occur at the same time. He has not had an injuries to his neck or arms. He thinks it may be "vascular". Please advise.

## 2021-09-08 NOTE — Progress Notes (Signed)
Cardiology Office Note:    Date:  09/09/2021   ID:  Jeff Barnes, DOB Mar 26, 1971, MRN [id number]  PCP:  Carol Ada, MD  Cardiologist:  Elouise Munroe, MD  Electrophysiologist:  None   Referring MD: Carol Ada, MD   Chief Complaint  Patient presents with   Chest Pain    History of Present Illness:    Jeff Barnes is a 51 y.o. male with a hx of CAD status post NSTEMI 08/06/2019, heart failure with reduced ejection fraction, GERD who presents for follow-up.  He follows with Dr. Margaretann Loveless, last seen 04/01/2021.  He presented to [facility] with chest pain on 08/06/2019.  Troponin elevated to 2403.  Cath showed total ostial occlusion of large LAD status post DES.  Also had mid LAD 50% stenosis.  Echocardiogram on 08/07/2019 showed EF 40 to 45% with anterior hypokinesis.  He underwent nuclear stress test 10/10/2019 which showed normal perfusion, EF 57%.  Echocardiogram 11/13/2019 showed EF improved to 60 to 65%, normal RV function, no significant valvular disease.  Since last clinic visit, he reports he has been having issues with chest pain.  Describes pressure on left side of his chest, occurs every 2 to 3 days.  Has not noted relationship with exertion or stress.  Did not feel like prior MI, described as more of a burning pain.  Episodes last less than 5 minutes.  He did take nitroglycerin once, did not notice improvement.  He denies any dyspnea, headedness, syncope, lower extreme edema, or palpitations.  He has been taking aspirin and Brilinta, denies any bleeding issues.  He walks 1 mile daily during the week and on the weekends will go hiking for 3-day miles.    Past Medical History:  Diagnosis Date   Cancer [facility])    skin cancer on left face   Coronary artery disease    GERD (gastroesophageal reflux disease)    H/O scarlet fever    Myocardial infarction [facility])    November   Pneumonia     Past Surgical History:  Procedure Laterality Date   BACK SURGERY     CARDIAC  CATHETERIZATION     COLONOSCOPY WITH PROPOFOL N/A 05/31/2021   Procedure: COLONOSCOPY WITH PROPOFOL;  Surgeon: Irving Copas., MD;  Location: [facility];  Service: Gastroenterology;  Laterality: N/A;   CORONARY STENT INTERVENTION  08/06/2019   CORONARY STENT INTERVENTION N/A 08/06/2019   Procedure: CORONARY STENT INTERVENTION;  Surgeon: Troy Sine, MD;  Location: [place] [facility];  Service: Cardiovascular;  Laterality: N/A;   ENDOSCOPIC MUCOSAL RESECTION N/A 05/31/2021   Procedure: ENDOSCOPIC MUCOSAL RESECTION;  Surgeon: Rush Landmark Telford Nab., MD;  Location: [facility];  Service: Gastroenterology;  Laterality: N/A;   HEMOSTASIS CLIP PLACEMENT  05/31/2021   Procedure: HEMOSTASIS CLIP PLACEMENT;  Surgeon: Irving Copas., MD;  Location: [facility];  Service: Gastroenterology;;   INGUINAL HERNIA REPAIR Right 03/05/2020   Procedure: RIGHT OPEN INGUINAL HERNIA REPAIR WITH MESH;  Surgeon: Kinsinger, Arta Bruce, MD;  Location: [facility];  Service: General;  Laterality: Right;   LEFT HEART CATH AND CORONARY ANGIOGRAPHY N/A 08/06/2019   Procedure: LEFT HEART CATH AND CORONARY ANGIOGRAPHY;  Surgeon: Troy Sine, MD;  Location: [place] [facility];  Service: Cardiovascular;  Laterality: N/A;   POLYPECTOMY  05/31/2021   Procedure: POLYPECTOMY;  Surgeon: Rush Landmark Telford Nab., MD;  Location: [facility];  Service: Gastroenterology;;   SUBMUCOSAL LIFTING INJECTION  05/31/2021   Procedure: SUBMUCOSAL LIFTING INJECTION;  Surgeon: Irving Copas.,  MD;  Location: [facility];  Service: Gastroenterology;;    Current Medications: Current Meds  Medication Sig   aspirin 81 MG chewable tablet Chew 1 tablet (81 mg total) by mouth daily.   atorvastatin (LIPITOR) 80 MG tablet Take 1 tablet (80 mg total) by mouth daily at 6 PM.   carvedilol (COREG) 6.25 MG tablet Take 1 tablet (6.25 mg total) by mouth 2 (two) times daily with a meal.   esomeprazole (NEXIUM) 20 MG capsule Take 20 mg  by mouth daily.    ezetimibe (ZETIA) 10 MG tablet Take 1 tablet (10 mg total) by mouth daily.   fexofenadine (ALLEGRA) 180 MG tablet Take 180 mg by mouth in the morning.   hyoscyamine (LEVSIN SL) 0.125 MG SL tablet Place 0.125 mg under the tongue 3 (three) times daily as needed for cramping.   linaclotide (LINZESS) 145 MCG CAPS capsule Take 145 mcg by mouth daily as needed (constipation.).   sacubitril-valsartan (ENTRESTO) 24-26 MG Take 1 tablet by mouth 2 (two) times daily.   ticagrelor (BRILINTA) 60 MG TABS tablet Take 1 tablet (60 mg total) by mouth 2 (two) times daily.     Allergies:   Patient has no known allergies.   Social History   Socioeconomic History   Marital status: Married    Spouse name: Not on file   Number of children: 2   Years of education: 16   Highest education level: Bachelor's degree (e.g., BA, AB, BS)  Occupational History   Not on file  Tobacco Use   Smoking status: Never   Smokeless tobacco: Never  Vaping Use   Vaping Use: Never used  Substance and Sexual Activity   Alcohol use: Yes    Comment: occas.   Drug use: Never   Sexual activity: Not on file  Other Topics Concern   Not on file  Social History Narrative   Not on file   Social Determinants of Health   Financial Resource Strain: Not on file  Food Insecurity: Not on file  Transportation Needs: Not on file  Physical Activity: Not on file  Stress: Not on file  Social Connections: Not on file     Family History: The patient's family history includes CAD in his father; Cancer in his father. There is no history of Colon cancer, Esophageal cancer, Inflammatory bowel disease, Liver disease, Pancreatic cancer, Stomach cancer, or Rectal cancer.  ROS:   Please see the history of present illness.     All other systems reviewed and are negative.  EKGs/Labs/Other Studies Reviewed:    The following studies were reviewed today:   EKG:   09/09/21: Normal sinus rhythm, rate 66, no ST  abnormalities  Recent Labs: 05/20/2021: BUN 13; Creatinine, Ser 0.83; Hemoglobin 15.5; Platelets 206.0; Potassium 4.6; Sodium 140  Recent Lipid Panel    Component Value Date/Time   CHOL 138 05/20/2021 0926   TRIG 66 05/20/2021 0926   HDL 43 05/20/2021 0926   CHOLHDL 3.2 05/20/2021 0926   CHOLHDL 4.6 08/06/2019 0407   VLDL 18 08/06/2019 0407   LDLCALC 81 05/20/2021 0926    Physical Exam:    VS:  BP 122/86 (BP Location: Left Arm, Patient Position: Sitting, Cuff Size: Large)    Pulse 66    Ht 5\' 11"  (1.803 m)    Wt 180 lb 3.2 oz (81.7 kg)    SpO2 98%    BMI 25.13 kg/m     Wt Readings from Last 3 Encounters:  09/09/21 180 lb 3.2  oz (81.7 kg)  05/21/21 174 lb (78.9 kg)  05/14/21 176 lb (79.8 kg)     GEN:  Well nourished, well developed in no acute distress HEENT: Normal NECK: No JVD; No carotid bruits LYMPHATICS: No lymphadenopathy CARDIAC: RRR, no murmurs, rubs, gallops RESPIRATORY:  Clear to auscultation without rales, wheezing or rhonchi  ABDOMEN: Soft, non-tender, non-distended MUSCULOSKELETAL:  No edema; No deformity  SKIN: Warm and dry NEUROLOGIC:  Alert and oriented x 3 PSYCHIATRIC:  Normal affect   ASSESSMENT:    1. Chest pain of uncertain etiology   2. Coronary artery disease involving native coronary artery of native heart, unspecified whether angina present   3. Ischemic cardiomyopathy   4. Hyperlipidemia, unspecified hyperlipidemia type    PLAN:    Chest pain: History of CAD status post NSTEMI 08/06/2019 with PCI to ostial LAD.  Also had mid 50% LAD stenosis at that time.  He is having atypical chest pain -Recommend exercise Myoview to rule out ischemia -Continue aspirin, Brilinta -Continue atorvastatin 80 mg daily. -As needed sublingual nitroglycerin  Ischemic cardiomyopathy: EF 40 to 45% at time of NSTEMI in 08/2019.  Subsequently had improvement, EF 60 to 65% on 11/13/2019 echocardiogram -Continue Entresto 24-26 mg twice daily, Coreg 6.25 mg twice  daily  Hyperlipidemia: On atorvastatin 80 mg daily.  LDL 81 on 05/20/2021, will add Zetia 10 mg daily  RTC in 3 months   Shared Decision Making/Informed Consent The risks [chest pain, shortness of breath, cardiac arrhythmias, dizziness, blood pressure fluctuations, myocardial infarction, stroke/transient ischemic attack, nausea, vomiting, allergic reaction, radiation exposure, metallic taste sensation and life-threatening complications (estimated to be 1 in 10,000)], benefits (risk stratification, diagnosing coronary artery disease, treatment guidance) and alternatives of a nuclear stress test were discussed in detail with Jeff Barnes and he agrees to proceed.    Medication Adjustments/Labs and Tests Ordered: Current medicines are reviewed at length with the patient today.  Concerns regarding medicines are outlined above.  Orders Placed This Encounter  Procedures   MYOCARDIAL PERFUSION IMAGING   EKG 12-Lead   Meds ordered this encounter  Medications   nitroGLYCERIN (NITROSTAT) 0.4 MG SL tablet    Sig: Place 1 tablet (0.4 mg total) under the tongue every 5 (five) minutes x 3 doses as needed for chest pain.    Dispense:  25 tablet    Refill:  1   ezetimibe (ZETIA) 10 MG tablet    Sig: Take 1 tablet (10 mg total) by mouth daily.    Dispense:  90 tablet    Refill:  3    Patient Instructions  Medication Instructions:   START EZETIMIBE 10 MG ONCE DAILY  *If you need a refill on your cardiac medications before your next appointment, please call your pharmacy*   Testing/Procedures:  Your physician has requested that you have en exercise stress myoview. For further information please visit [email or web address]. Please follow instruction sheet, as given. [address]-DO NOT TAKE CARVEDILOL THE NIGHT BEFORE AND THE MORNING OF THE TEST   Follow-Up: At [facility], you and your health needs are our priority.  As part of our continuing mission to provide you with exceptional  heart care, we have created designated Provider Care Teams.  These Care Teams include your primary Cardiologist (physician) and Advanced Practice Providers (APPs -  Physician Assistants and Nurse Practitioners) who all work together to provide you with the care you need, when you need it.  We recommend signing up for the patient portal called "MyChart".  Sign up information is provided on this After Visit Summary.  MyChart is used to connect with patients for Virtual Visits (Telemedicine).  Patients are able to view lab/test results, encounter notes, upcoming appointments, etc.  Non-urgent messages can be sent to your provider as well.   To learn more about what you can do with MyChart, go to [email or web address].    Your next appointment:   3 month(s)  The format for your next appointment:   In Person  Provider:   Elouise Munroe, MD {       Signed, Donato Heinz, MD  09/09/2021 12:00 PM    [place]

## 2021-09-09 ENCOUNTER — Other Ambulatory Visit: Payer: Self-pay

## 2021-09-09 ENCOUNTER — Encounter: Payer: Self-pay | Admitting: Cardiology

## 2021-09-09 ENCOUNTER — Ambulatory Visit: Payer: 59 | Admitting: Cardiology

## 2021-09-09 ENCOUNTER — Telehealth (HOSPITAL_COMMUNITY): Payer: Self-pay | Admitting: *Deleted

## 2021-09-09 VITALS — BP 122/86 | HR 66 | Ht 71.0 in | Wt 180.2 lb

## 2021-09-09 DIAGNOSIS — I2510 Atherosclerotic heart disease of native coronary artery without angina pectoris: Secondary | ICD-10-CM | POA: Diagnosis not present

## 2021-09-09 DIAGNOSIS — E785 Hyperlipidemia, unspecified: Secondary | ICD-10-CM | POA: Diagnosis not present

## 2021-09-09 DIAGNOSIS — R079 Chest pain, unspecified: Secondary | ICD-10-CM | POA: Diagnosis not present

## 2021-09-09 DIAGNOSIS — I255 Ischemic cardiomyopathy: Secondary | ICD-10-CM | POA: Diagnosis not present

## 2021-09-09 MED ORDER — NITROGLYCERIN 0.4 MG SL SUBL: 0.4000 mg | SUBLINGUAL_TABLET | SUBLINGUAL | 1 refills | Status: DC | PRN

## 2021-09-09 MED ORDER — EZETIMIBE 10 MG PO TABS
10.0000 mg | ORAL_TABLET | Freq: Every day | ORAL | 3 refills | Status: DC
Start: 2021-09-09 — End: 2022-09-07

## 2021-09-09 NOTE — Telephone Encounter (Signed)
Patient given detailed instructions per Myocardial Perfusion Study Information Sheet for the test on 09/15/20 Patient notified to arrive 15 minutes early and that it is imperative to arrive on time for appointment to keep from having the test rescheduled.  If you need to cancel or reschedule your appointment, please call the office within 24 hours of your appointment. . Patient verbalized understanding. Jeff Barnes

## 2021-09-09 NOTE — Patient Instructions (Addendum)
Medication Instructions:   START EZETIMIBE 10 MG ONCE DAILY  *If you need a refill on your cardiac medications before your next appointment, please call your pharmacy*   Testing/Procedures:  Your physician has requested that you have en exercise stress myoview. For further information please visit [email or web address]. Please follow instruction sheet, as given. [address]-DO NOT TAKE CARVEDILOL THE NIGHT BEFORE AND THE MORNING OF THE TEST   Follow-Up: At [facility], you and your health needs are our priority.  As part of our continuing mission to provide you with exceptional heart care, we have created designated Provider Care Teams.  These Care Teams include your primary Cardiologist (physician) and Advanced Practice Providers (APPs -  Physician Assistants and Nurse Practitioners) who all work together to provide you with the care you need, when you need it.  We recommend signing up for the patient portal called "MyChart".  Sign up information is provided on this After Visit Summary.  MyChart is used to connect with patients for Virtual Visits (Telemedicine).  Patients are able to view lab/test results, encounter notes, upcoming appointments, etc.  Non-urgent messages can be sent to your provider as well.   To learn more about what you can do with MyChart, go to [email or web address].    Your next appointment:   3 month(s)  The format for your next appointment:   In Person  Provider:   Elouise Munroe, MD {

## 2021-09-15 ENCOUNTER — Other Ambulatory Visit: Payer: Self-pay

## 2021-09-15 ENCOUNTER — Ambulatory Visit (HOSPITAL_COMMUNITY): Payer: 59 | Attending: Cardiovascular Disease

## 2021-09-15 DIAGNOSIS — R079 Chest pain, unspecified: Secondary | ICD-10-CM | POA: Insufficient documentation

## 2021-09-15 LAB — MYOCARDIAL PERFUSION IMAGING
Angina Index: 0
Duke Treadmill Score: 10
Estimated workload: 11.7
Exercise duration (min): 9 min
Exercise duration (sec): 59 s
LV dias vol: 77 mL (ref 62–150)
LV sys vol: 32 mL
MPHR: 171 {beats}/min
Nuc Stress EF: 58 %
Peak HR: 166 {beats}/min
Percent HR: 97 %
Rest HR: 80 {beats}/min
Rest Nuclear Isotope Dose: 10.2 mCi
SDS: 0
SRS: 0
SSS: 0
ST Depression (mm): 0 mm
Stress Nuclear Isotope Dose: 31.3 mCi
TID: 1.04

## 2021-09-15 MED ORDER — TECHNETIUM TC 99M TETROFOSMIN IV KIT
10.2000 | PACK | Freq: Once | INTRAVENOUS | Status: AC | PRN
  Administered 2021-09-15: 10.2 via INTRAVENOUS
  Filled 2021-09-15: qty 11

## 2021-09-15 MED ORDER — TECHNETIUM TC 99M TETROFOSMIN IV KIT
31.3000 | PACK | Freq: Once | INTRAVENOUS | Status: AC | PRN
  Administered 2021-09-15: 31.3 via INTRAVENOUS
  Filled 2021-09-15: qty 32

## 2021-09-18 ENCOUNTER — Other Ambulatory Visit: Payer: Self-pay | Admitting: Internal Medicine

## 2021-09-22 ENCOUNTER — Other Ambulatory Visit: Payer: Self-pay | Admitting: Internal Medicine

## 2021-09-24 ENCOUNTER — Encounter: Payer: Self-pay | Admitting: Internal Medicine

## 2021-09-24 MED ORDER — CARVEDILOL 6.25 MG PO TABS: 6.2500 mg | ORAL_TABLET | Freq: Two times a day (BID) | ORAL | 11 refills | Status: DC

## 2021-09-24 MED ORDER — ATORVASTATIN CALCIUM 80 MG PO TABS: 80.0000 mg | ORAL_TABLET | Freq: Every day | ORAL | 11 refills | Status: DC

## 2021-09-24 NOTE — Telephone Encounter (Signed)
°*  STAT* If patient is at the pharmacy, call can be transferred to refill team.   1. Which medications need to be refilled? (please list name of each medication and dose if known) Atorvastatin and Carvedilol  2. Which pharmacy/location (including street and city if local pharmacy) is medication to be sent to? [facility] [address], [place]  3. Do they need a 30 day or 90 day supply? 90 days and refills

## 2021-09-24 NOTE — Addendum Note (Signed)
Addended by: Rexanne Mano B on: 09/24/2021 12:56 PM   Modules accepted: Orders

## 2021-09-24 NOTE — Telephone Encounter (Signed)
Medication refilled

## 2021-09-24 NOTE — Telephone Encounter (Signed)
Spoke to patient . Patient aware . Refilled Entresto - 90 day supply  with 3 refills

## 2021-10-24 ENCOUNTER — Telehealth: Payer: 59 | Admitting: Family

## 2021-10-24 DIAGNOSIS — B029 Zoster without complications: Secondary | ICD-10-CM

## 2021-10-24 MED ORDER — GABAPENTIN 300 MG PO CAPS: 300.0000 mg | ORAL_CAPSULE | Freq: Two times a day (BID) | ORAL | 0 refills | Status: DC

## 2021-10-24 MED ORDER — VALACYCLOVIR HCL 1 G PO TABS: 1000.0000 mg | ORAL_TABLET | Freq: Three times a day (TID) | ORAL | 0 refills | Status: AC

## 2021-10-24 NOTE — Progress Notes (Signed)
E-visit for Shingles   We are sorry that you are not feeling well. Here is how we plan to help!  Based on what you shared with me it looks like you have shingles.  Shingles or herpes zoster, is a common infection of the nerves.  It is a painful rash caused by the herpes zoster virus.  This is the same virus that causes chickenpox.  After a person has chickenpox, the virus remains inactive in the nerve cells.  Years later, the virus can become active again and travel to the skin.  It typically will appear on one side of the face or body.  Burning or shooting pain, tingling, or itching are early signs of the infection.  Blisters typically scab over in 7 to 10 days and clear up within 2-4 weeks. Shingles is only contagious to people that have never had the chickenpox, the chickenpox vaccine, or anyone who has a compromised immune system.  You should avoid contact with these type of people until your blisters scab over.  I have prescribed Valacyclovir 1g three times daily for 7 days and also Gabapentin 300mg  twice daily as needed for pain   HOME CARE: Apply ice packs (wrapped in a thin towel), cool compresses, or soak in cool bath to help reduce pain. Use calamine lotion to calm itchy skin. Avoid scratching the rash. Avoid direct sunlight.  GET HELP RIGHT AWAY IF: Symptoms that dont away after treatment. A rash or blisters near your eye. Increased drainage, fever, or rash after treatment. Severe pain that doesnt go away.   MAKE SURE YOU   Understand these instructions. Will watch your condition. Will get help right away if you are not doing well or get worse.  Thank you for choosing an e-visit.  Your e-visit answers were reviewed by a board certified advanced clinical practitioner to complete your personal care plan. Depending upon the condition, your plan could have included both over the counter or prescription medications.  Please review your pharmacy choice. Make sure the pharmacy  is open so you can pick up prescription now. If there is a problem, you may contact your provider through [employer] and have the prescription routed to another pharmacy.  Your safety is important to [place]. If you have drug allergies check your prescription carefully.   For the next 24 hours you can use MyChart to ask questions about today's visit, request a non-urgent call back, or ask for a work or school excuse. You will get an email in the next two days asking about your experience. I hope that your e-visit has been valuable and will speed your recovery.   Approximately 5 minutes was spent documenting and reviewing patient's chart.

## 2021-10-25 ENCOUNTER — Telehealth: Payer: 59 | Admitting: Physician Assistant

## 2021-10-25 DIAGNOSIS — B028 Zoster with other complications: Secondary | ICD-10-CM

## 2021-10-25 NOTE — Progress Notes (Signed)
E-visit for Shingles   We are sorry that you are not feeling well. Here is how we plan to help!  Based on what you shared with me it looks like you have shingles.  Shingles or herpes zoster, is a common infection of the nerves.  It is a painful rash caused by the herpes zoster virus.  This is the same virus that causes chickenpox.  After a person has chickenpox, the virus remains inactive in the nerve cells.  Years later, the virus can become active again and travel to the skin.  It typically will appear on one side of the face or body.  Burning or shooting pain, tingling, or itching are early signs of the infection.  Blisters typically scab over in 7 to 10 days and clear up within 2-4 weeks. Shingles is only contagious to people that have never had the chickenpox, the chickenpox vaccine, or anyone who has a compromised immune system.  You should avoid contact with these type of people until your blisters scab over.  It is not uncommon for it to worsen slightly today since you just started the medication yesterday. I would try to give it one more day to start improving. If symptoms continue to progress please be seen in person at your PCP office or at a local UC.   HOME CARE: Apply ice packs (wrapped in a thin towel), cool compresses, or soak in cool bath to help reduce pain. Use calamine lotion to calm itchy skin. Avoid scratching the rash. Avoid direct sunlight.  GET HELP RIGHT AWAY IF: Symptoms that dont away after treatment. A rash or blisters near your eye. Increased drainage, fever, or rash after treatment. Severe pain that doesnt go away.   MAKE SURE YOU   Understand these instructions. Will watch your condition. Will get help right away if you are not doing well or get worse.  Thank you for choosing an e-visit.  Your e-visit answers were reviewed by a board certified advanced clinical practitioner to complete your personal care plan. Depending upon the condition, your plan  could have included both over the counter or prescription medications.  Please review your pharmacy choice. Make sure the pharmacy is open so you can pick up prescription now. If there is a problem, you may contact your provider through [employer] and have the prescription routed to another pharmacy.  Your safety is important to [place]. If you have drug allergies check your prescription carefully.   For the next 24 hours you can use MyChart to ask questions about today's visit, request a non-urgent call back, or ask for a work or school excuse. You will get an email in the next two days asking about your experience. I hope that your e-visit has been valuable and will speed your recovery.  I provided 5 minutes of non face-to-face time during this encounter for chart review and documentation.

## 2021-12-29 NOTE — Progress Notes (Signed)
?Cardiology Office Note:   ? ?Date:  12/30/2021  ? ?ID:  Jeff Barnes, DOB Aug 05, 1971, MRN [id number] ? ?PCP:  Carol Ada, MD  ?Cardiologist:  Elouise Munroe, MD  ?Electrophysiologist:  None  ? ?Referring MD: Carol Ada, MD  ? ?Chief Complaint/Reason for Referral: ?Prior NSTEMI, ostial LAD stent, ischemic cardiomyopathy with recovery of EF ? ?History of Present Illness:   ? ?Jeff Barnes is a 51 y.o. male with a history of GERD, hyperlipidemia, NSTEMI August 06, 2019 with reduced ejection fraction. Started on heart failure therapy and EF has recovered. He had done very well with physical activity after MI, participated in cardiac rehab. Had one episode of atypical chest pain, stress nuclear study was negative for ischemia.  ? ?At his last appointment he was feeling well. He was s/p colonoscopy, and scheduled for a biopsy of a found polyp. He denied any bleeding issues during the procedure or otherwise. His LDL was 81, HDL was 48. We discussed the option of using PCSK9 inhibitors for his cholesterol management given secondary prevention of CAD and goal LDL of 55-70 to be as preventive as possible. ? ?He messaged the office 09/08/2021 complaining of "weird periodic sensations" in his chest and numbness in his bilateral arms for one week prior. No relieving or worsening factors, and his symptoms do not occur at the same time. He was concerned there may be a vascular etiology, so he was scheduled to see Dr. Gardiner Rhyme for a DOD visit the next day. At that visit he described left sided chest pressure with a burning pain, not similar to his prior MI. No improvement with nitroglycerin. An exercise Myoview was recommended to rule out ischemia, and on 09/15/2021 this revealed no evidence of ischemia or infarction. Nuclear stress EF was 58%; achieved 11.7 METS. His LDL was 81 on 05/20/2021, so 10 mg Zetia once daily was added.  ? ?Today: ?At this time he denies any recurrent chest pains since  January. ? ?Lately he complains of constant rhinorrhea lasting throughout the day. Of note, he has also noticed epistaxis when he is congested in the morning. His epistaxis does not seem to be spontaneous, only occuring as he tries to clear his nasal passages. He denies seasonal allergies. He believes this is a side effect of one of his medications. ? ?Since January he suffered from an episode of shingles, but he has mostly recovered. It initially began around his prior varicella scar superior to his upper lip. He still feels some residual pain, but this is very mild.  ? ?He denies any palpitations, shortness of breath, or peripheral edema. No lightheadedness, headaches, syncope, orthopnea, or PND. ? ? ?Past Medical History:  ?Diagnosis Date  ? Cancer [facility])   ? skin cancer on left face  ? Coronary artery disease   ? GERD (gastroesophageal reflux disease)   ? H/O scarlet fever   ? Myocardial infarction [facility])   ? November  ? Pneumonia   ? ? ?Past Surgical History:  ?Procedure Laterality Date  ? BACK SURGERY    ? CARDIAC CATHETERIZATION    ? COLONOSCOPY WITH PROPOFOL N/A 05/31/2021  ? Procedure: COLONOSCOPY WITH PROPOFOL;  Surgeon: Mansouraty, Telford Nab., MD;  Location: [facility];  Service: Gastroenterology;  Laterality: N/A;  ? CORONARY STENT INTERVENTION  08/06/2019  ? CORONARY STENT INTERVENTION N/A 08/06/2019  ? Procedure: CORONARY STENT INTERVENTION;  Surgeon: Troy Sine, MD;  Location: [place] [facility];  Service: Cardiovascular;  Laterality: N/A;  ? ENDOSCOPIC  MUCOSAL RESECTION N/A 05/31/2021  ? Procedure: ENDOSCOPIC MUCOSAL RESECTION;  Surgeon: Rush Landmark Telford Nab., MD;  Location: [facility];  Service: Gastroenterology;  Laterality: N/A;  ? HEMOSTASIS CLIP PLACEMENT  05/31/2021  ? Procedure: HEMOSTASIS CLIP PLACEMENT;  Surgeon: Irving Copas., MD;  Location: [facility];  Service: Gastroenterology;;  ? INGUINAL HERNIA REPAIR Right 03/05/2020  ? Procedure: RIGHT OPEN INGUINAL HERNIA REPAIR  WITH MESH;  Surgeon: Kinsinger, Arta Bruce, MD;  Location: [facility] ORS;  Service: General;  Laterality: Right;  ? LEFT HEART CATH AND CORONARY ANGIOGRAPHY N/A 08/06/2019  ? Procedure: LEFT HEART CATH AND CORONARY ANGIOGRAPHY;  Surgeon: Troy Sine, MD;  Location: [place] CV [facility];  Service: Cardiovascular;  Laterality: N/A;  ? POLYPECTOMY  05/31/2021  ? Procedure: POLYPECTOMY;  Surgeon: Rush Landmark Telford Nab., MD;  Location: [facility];  Service: Gastroenterology;;  ? [place] INJECTION  05/31/2021  ? Procedure: SUBMUCOSAL LIFTING INJECTION;  Surgeon: Irving Copas., MD;  Location: [facility];  Service: Gastroenterology;;  ? ? ?Current Medications: ?Current Meds  ?Medication Sig  ? acetaminophen (TYLENOL) 500 MG tablet Take 1,000 mg by mouth every 6 (six) hours as needed (for pain).  ? aspirin 81 MG chewable tablet Chew 1 tablet (81 mg total) by mouth daily.  ? atorvastatin (LIPITOR) 80 MG tablet Take 1 tablet (80 mg total) by mouth daily.  ? carvedilol (COREG) 6.25 MG tablet Take 1 tablet (6.25 mg total) by mouth 2 (two) times daily with a meal.  ? esomeprazole (NEXIUM) 20 MG capsule Take 20 mg by mouth daily.   ? ezetimibe (ZETIA) 10 MG tablet Take 1 tablet (10 mg total) by mouth daily.  ? fexofenadine (ALLEGRA) 180 MG tablet Take 180 mg by mouth in the morning.  ? hyoscyamine (LEVSIN SL) 0.125 MG SL tablet Place 0.125 mg under the tongue 3 (three) times daily as needed for cramping.  ? linaclotide (LINZESS) 145 MCG CAPS capsule Take 145 mcg by mouth daily as needed (constipation.).  ? nitroGLYCERIN (NITROSTAT) 0.4 MG SL tablet Place 1 tablet (0.4 mg total) under the tongue every 5 (five) minutes x 3 doses as needed for chest pain.  ? sacubitril-valsartan (ENTRESTO) 24-26 MG TAKE 1 TABLET BY MOUTH TWICE A DAY  ? sildenafil (VIAGRA) 100 MG tablet Take 100 mg by mouth daily as needed for erectile dysfunction.  ? tetrahydrozoline 0.05 % ophthalmic solution Place 1 drop into both eyes 3 (three)  times daily as needed (dry/irritated eyes).  ? ticagrelor (BRILINTA) 60 MG TABS tablet Take 1 tablet (60 mg total) by mouth 2 (two) times daily.  ?  ? ?Allergies:   Patient has no known allergies.  ? ?Social History  ? ?Tobacco Use  ? Smoking status: Never  ? Smokeless tobacco: Never  ?Vaping Use  ? Vaping Use: Never used  ?Substance Use Topics  ? Alcohol use: Yes  ?  Comment: occas.  ? Drug use: Never  ?  ? ?Family History: ?The patient's family history includes CAD in his father; Cancer in his father. There is no history of Colon cancer, Esophageal cancer, Inflammatory bowel disease, Liver disease, Pancreatic cancer, Stomach cancer, or Rectal cancer. ? ?ROS:   ?Please see the history of present illness.    ?(+) Residual shingles pain of face ?(+) Rhinorrhea ?(+) Epistaxis ?(+) Congestion ?All other systems reviewed and are negative. ? ?EKGs/Labs/Other Studies Reviewed:   ? ?The following studies were reviewed today: ? ?Exercise Stress Myoview 09/15/2021: ?  The study is normal. The study  is low risk. ?  No ST deviation was noted. ?  LV perfusion is normal. There is no evidence of ischemia. There is no evidence of infarction. ?  Left ventricular function is normal. Nuclear stress EF: 58 %. The left ventricular ejection fraction is normal (55-65%). End diastolic cavity size is normal. End systolic cavity size is normal. ?  Prior study available for comparison from 10/10/2019. ?  ?Reduced counts in the inferior segments that improve on stress imaging and normal wall motion consistent with diaphragm attenuation.  ?Normal study without evidence of ischemia or infarction.  ?Normal LVEF, 58%.  ?Excellent exercise capacity (9:59 min:s; 11.7 METS).  ?Normal HR/BP response to exercise.  ?This is a low-risk study.  ? ?Echo 07/08/2020: ?1. Left ventricular ejection fraction, by estimation, is 55 to 60%. The  ?left ventricle has normal function. The left ventricle has no regional  ?wall motion abnormalities. Left ventricular  diastolic parameters were  ?normal.  ? 2. Right ventricular systolic function is normal. The right ventricular  ?size is normal.  ? 3. The mitral valve is normal in structure. Trivial mitral valve  ?regurgitation. No evidence

## 2021-12-30 ENCOUNTER — Ambulatory Visit (INDEPENDENT_AMBULATORY_CARE_PROVIDER_SITE_OTHER): Payer: 59 | Admitting: Internal Medicine

## 2021-12-30 ENCOUNTER — Encounter: Payer: Self-pay | Admitting: Internal Medicine

## 2021-12-30 VITALS — BP 118/82 | HR 78 | Ht 71.0 in | Wt 182.0 lb

## 2021-12-30 DIAGNOSIS — E785 Hyperlipidemia, unspecified: Secondary | ICD-10-CM

## 2021-12-30 DIAGNOSIS — I2510 Atherosclerotic heart disease of native coronary artery without angina pectoris: Secondary | ICD-10-CM

## 2021-12-30 DIAGNOSIS — I214 Non-ST elevation (NSTEMI) myocardial infarction: Secondary | ICD-10-CM | POA: Diagnosis not present

## 2021-12-30 DIAGNOSIS — J3489 Other specified disorders of nose and nasal sinuses: Secondary | ICD-10-CM

## 2021-12-30 DIAGNOSIS — I255 Ischemic cardiomyopathy: Secondary | ICD-10-CM | POA: Diagnosis not present

## 2021-12-30 NOTE — Patient Instructions (Addendum)
Medication Instructions:  ?No Changes In Medications at this time.  ?*If you need a refill on your cardiac medications before your next appointment, please call your pharmacy* ? ?PLEASE HAVE FASTING LABS PRIOR TO NEXT FOLLOW UP ? ?Follow-Up: ?At [facility], you and your health needs are our priority.  As part of our continuing mission to provide you with exceptional heart care, we have created designated Provider Care Teams.  These Care Teams include your primary Cardiologist (physician) and Advanced Practice Providers (APPs -  Physician Assistants and Nurse Practitioners) who all work together to provide you with the care you need, when you need it. ? ?Your next appointment:   ?6 month(s) ? ?The format for your next appointment:   ?In Person ? ?Provider:   ?Elouise Munroe, MD   ? ? ? ?  ?

## 2022-01-12 ENCOUNTER — Telehealth: Payer: 59 | Admitting: Physician Assistant

## 2022-01-12 DIAGNOSIS — H5711 Ocular pain, right eye: Secondary | ICD-10-CM

## 2022-01-12 NOTE — Progress Notes (Signed)
?Virtual Visit Consent  ? ?Jeff Barnes, you are scheduled for a virtual visit with a [facility] provider today. Just as with appointments in the office, your consent must be obtained to participate. Your consent will be active for this visit and any virtual visit you may have with one of our providers in the next 365 days. If you have a MyChart account, a copy of this consent can be sent to you electronically. ? ?As this is a virtual visit, video technology does not allow for your provider to perform a traditional examination. This may limit your provider's ability to fully assess your condition. If your provider identifies any concerns that need to be evaluated in person or the need to arrange testing (such as labs, EKG, etc.), we will make arrangements to do so. Although advances in technology are sophisticated, we cannot ensure that it will always work on either your end or our end. If the connection with a video visit is poor, the visit may have to be switched to a telephone visit. With either a video or telephone visit, we are not always able to ensure that we have a secure connection. ? ?By engaging in this virtual visit, you consent to the provision of healthcare and authorize for your insurance to be billed (if applicable) for the services provided during this visit. Depending on your insurance coverage, you may receive a charge related to this service. ? ?I need to obtain your verbal consent now. Are you willing to proceed with your visit today? Jeff Barnes has provided verbal consent on 01/12/2022 for a virtual visit (video or telephone). Jeff Rio, PA-C ? ?Date: 01/12/2022 12:17 PM ? ?Virtual Visit via Video Note  ? ?ILeeanne Barnes, connected with  Jeff Barnes  ([id number], August 17, 1971) on 01/12/22 at 12:00 PM EDT by a video-enabled telemedicine application and verified that I am speaking with the correct person using two identifiers. ? ?Location: ?Patient: Virtual  Visit Location Patient: Home ?Provider: Virtual Visit Location Provider: Home Office ?  ?I discussed the limitations of evaluation and management by telemedicine and the availability of in person appointments. The patient expressed understanding and agreed to proceed.   ? ?History of Present Illness: ?Jeff Barnes is a 51 y.o. who identifies as a male who was assigned male at birth, and is being seen today for R eye pain for the past 4 days, mainly lateral and associated with headache and pressure with some tearing. Notes pain comes and goes. Eye is tender to touch with pain with looking in various directions. Denies fever, chills. Denies trauma or injury. Denies noted redness or swelling. Has history of styes but cannot feel any bumps within the eyelid. Denies any diplopia or abrupt vision change. Denies photophobia. Denies sinus pressure or pain.  ? ?HPI: HPI  ?Problems:  ?Patient Active Problem List  ? Diagnosis Date Noted  ? Long term (current) use of antithrombotics/antiplatelets 05/17/2021  ? Abnormal colonoscopy 05/17/2021  ? Adenomatous polyp of descending colon 05/17/2021  ? Right inguinal hernia 03/05/2020  ? Hyperlipidemia 08/08/2019  ? Hypertension 08/08/2019  ? Ischemic cardiomyopathy 08/08/2019  ? Unstable angina ([place]) 08/08/2019  ? CAD (coronary artery disease) s/p DES x2 mid LAD/prox LAD 08/07/19 08/08/2019  ? NSTEMI (non-ST elevated myocardial infarction) ([place]) 08/05/2019  ?  ?Allergies: No Known Allergies ?Medications:  ?Current Outpatient Medications:  ?  acetaminophen (TYLENOL) 500 MG tablet, Take 1,000 mg by mouth every 6 (six) hours as needed (for pain).,  Disp: , Rfl:  ?  aspirin 81 MG chewable tablet, Chew 1 tablet (81 mg total) by mouth daily., Disp: 90 tablet, Rfl: 3 ?  atorvastatin (LIPITOR) 80 MG tablet, Take 1 tablet (80 mg total) by mouth daily., Disp: 30 tablet, Rfl: 11 ?  carvedilol (COREG) 6.25 MG tablet, Take 1 tablet (6.25 mg total) by mouth 2 (two) times daily with a meal.,  Disp: 60 tablet, Rfl: 11 ?  esomeprazole (NEXIUM) 20 MG capsule, Take 20 mg by mouth daily. , Disp: , Rfl:  ?  ezetimibe (ZETIA) 10 MG tablet, Take 1 tablet (10 mg total) by mouth daily., Disp: 90 tablet, Rfl: 3 ?  fexofenadine (ALLEGRA) 180 MG tablet, Take 180 mg by mouth in the morning., Disp: , Rfl:  ?  hyoscyamine (LEVSIN SL) 0.125 MG SL tablet, Place 0.125 mg under the tongue 3 (three) times daily as needed for cramping., Disp: , Rfl:  ?  linaclotide (LINZESS) 145 MCG CAPS capsule, Take 145 mcg by mouth daily as needed (constipation.)., Disp: , Rfl:  ?  nitroGLYCERIN (NITROSTAT) 0.4 MG SL tablet, Place 1 tablet (0.4 mg total) under the tongue every 5 (five) minutes x 3 doses as needed for chest pain., Disp: 25 tablet, Rfl: 1 ?  sacubitril-valsartan (ENTRESTO) 24-26 MG, TAKE 1 TABLET BY MOUTH TWICE A DAY, Disp: 180 tablet, Rfl: 3 ?  sildenafil (VIAGRA) 100 MG tablet, Take 100 mg by mouth daily as needed for erectile dysfunction., Disp: , Rfl:  ?  tetrahydrozoline 0.05 % ophthalmic solution, Place 1 drop into both eyes 3 (three) times daily as needed (dry/irritated eyes)., Disp: , Rfl:  ?  ticagrelor (BRILINTA) 60 MG TABS tablet, Take 1 tablet (60 mg total) by mouth 2 (two) times daily., Disp: 180 tablet, Rfl: 3 ? ?Observations/Objective: ?Patient is well-developed, well-nourished in no acute distress.  ?Resting comfortably at home.  ?Head is normocephalic, atraumatic.  ?No labored breathing. ?Speech is clear and coherent with logical content.  ?Patient is alert and oriented at baseline.  ?From video view, conjunctiva within normal limits bilaterally. Pupils are equal. No lid swelling or lesion noted.  ? ?Assessment and Plan: ?1. Acute right eye pain ? ?Unclear etiology. No significant URI symptoms or sinus pain. Afebrile. Pain with EOM but EOMI. No noted swelling on exam. Recommend in-person evaluation for eye examination just to be cautious and rule out more significant causes of symptoms and so proper  treatment can be given. He is calling eye doctor now. If unable to be seen their he will call PCP or go to Urgent Care.  ? ?Follow Up Instructions: ?I discussed the assessment and treatment plan with the patient. The patient was provided an opportunity to ask questions and all were answered. The patient agreed with the plan and demonstrated an understanding of the instructions.  A copy of instructions were sent to the patient via MyChart unless otherwise noted below.  ? ?The patient was advised to call back or seek an in-person evaluation if the symptoms worsen or if the condition fails to improve as anticipated. ? ?Time:  ?I spent 10 minutes with the patient via telehealth technology discussing the above problems/concerns.   ? ?Jeff Rio, PA-C ?

## 2022-01-12 NOTE — Patient Instructions (Addendum)
?  Jeff Barnes, thank you for joining Leeanne Rio, PA-C for today's virtual visit.  While this provider is not your primary care provider (PCP), if your PCP is located in our provider database this encounter information will be shared with them immediately following your visit. ? ?Consent: ?(Patient) RICKE KIMOTO provided verbal consent for this virtual visit at the beginning of the encounter. ? ?Current Medications: ? ?Current Outpatient Medications:  ?  acetaminophen (TYLENOL) 500 MG tablet, Take 1,000 mg by mouth every 6 (six) hours as needed (for pain)., Disp: , Rfl:  ?  aspirin 81 MG chewable tablet, Chew 1 tablet (81 mg total) by mouth daily., Disp: 90 tablet, Rfl: 3 ?  atorvastatin (LIPITOR) 80 MG tablet, Take 1 tablet (80 mg total) by mouth daily., Disp: 30 tablet, Rfl: 11 ?  carvedilol (COREG) 6.25 MG tablet, Take 1 tablet (6.25 mg total) by mouth 2 (two) times daily with a meal., Disp: 60 tablet, Rfl: 11 ?  esomeprazole (NEXIUM) 20 MG capsule, Take 20 mg by mouth daily. , Disp: , Rfl:  ?  ezetimibe (ZETIA) 10 MG tablet, Take 1 tablet (10 mg total) by mouth daily., Disp: 90 tablet, Rfl: 3 ?  fexofenadine (ALLEGRA) 180 MG tablet, Take 180 mg by mouth in the morning., Disp: , Rfl:  ?  hyoscyamine (LEVSIN SL) 0.125 MG SL tablet, Place 0.125 mg under the tongue 3 (three) times daily as needed for cramping., Disp: , Rfl:  ?  linaclotide (LINZESS) 145 MCG CAPS capsule, Take 145 mcg by mouth daily as needed (constipation.)., Disp: , Rfl:  ?  nitroGLYCERIN (NITROSTAT) 0.4 MG SL tablet, Place 1 tablet (0.4 mg total) under the tongue every 5 (five) minutes x 3 doses as needed for chest pain., Disp: 25 tablet, Rfl: 1 ?  sacubitril-valsartan (ENTRESTO) 24-26 MG, TAKE 1 TABLET BY MOUTH TWICE A DAY, Disp: 180 tablet, Rfl: 3 ?  sildenafil (VIAGRA) 100 MG tablet, Take 100 mg by mouth daily as needed for erectile dysfunction., Disp: , Rfl:  ?  tetrahydrozoline 0.05 % ophthalmic solution, Place 1 drop  into both eyes 3 (three) times daily as needed (dry/irritated eyes)., Disp: , Rfl:  ?  ticagrelor (BRILINTA) 60 MG TABS tablet, Take 1 tablet (60 mg total) by mouth 2 (two) times daily., Disp: 180 tablet, Rfl: 3  ? ?Medications ordered in this encounter:  ?No orders of the defined types were placed in this encounter. ?  ? ?*If you need refills on other medications prior to your next appointment, please contact your pharmacy* ? ?Follow-Up: ?Call back or seek an in-person evaluation if the symptoms worsen or if the condition fails to improve as anticipated. ? ?Other Instruction ? ? ?If you have been instructed to have an in-person evaluation today at a local Urgent Care facility, please use the link below. It will take you to a list of all of our available [facility], including address, phone number and hours of operation. Please do not delay care.  ?[facility] ? ?If you or a family member do not have a primary care provider, use the link below to schedule a visit and establish care. When you choose a [facility] primary care physician or advanced practice provider, you gain a long-term partner in health. ?Find a Primary Care Provider ? ?Learn more about [facility] in-office and virtual care options: ?[facility]  ?

## 2022-02-05 ENCOUNTER — Other Ambulatory Visit: Payer: Self-pay | Admitting: Internal Medicine

## 2022-02-08 ENCOUNTER — Encounter: Payer: Self-pay | Admitting: Internal Medicine

## 2022-02-08 MED ORDER — TICAGRELOR 60 MG PO TABS: 60.0000 mg | ORAL_TABLET | Freq: Two times a day (BID) | ORAL | 3 refills | Status: DC

## 2022-03-21 ENCOUNTER — Encounter: Payer: Self-pay | Admitting: Gastroenterology

## 2022-03-21 ENCOUNTER — Encounter: Payer: Self-pay | Admitting: Internal Medicine

## 2022-04-01 ENCOUNTER — Other Ambulatory Visit (HOSPITAL_COMMUNITY): Payer: 59

## 2022-06-03 IMAGING — MR MR LUMBAR SPINE W/O CM
4 of 5 series · 25 of 48 positions shown · non-contrast
Comparison: Thoracic spine MRI from the same day reported
separately.

CLINICAL DATA: 49-year-old male with thoracolumbar back pain
radiating to the hips since [REDACTED] with no known injury.

EXAM:
MRI LUMBAR SPINE WITHOUT CONTRAST
TECHNIQUE: Multiplanar, multisequence MR imaging of the lumbar spine was
performed. No intravenous contrast was administered.

[Series 3: T2 · sagittal · 4.0mm · 0.53mm/px · 6 of 16 slices shown (1 of 2)]
[im 1/16]
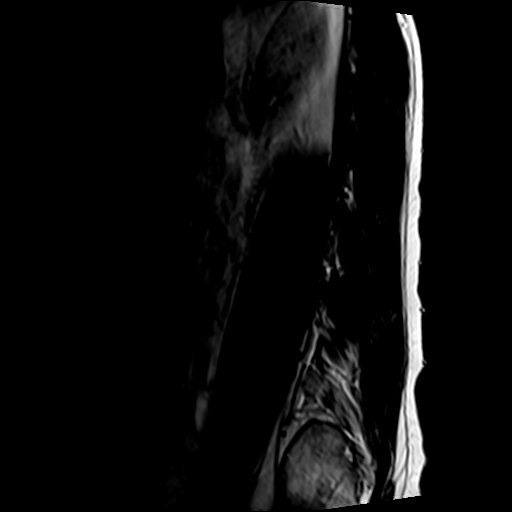
[im 4/16]
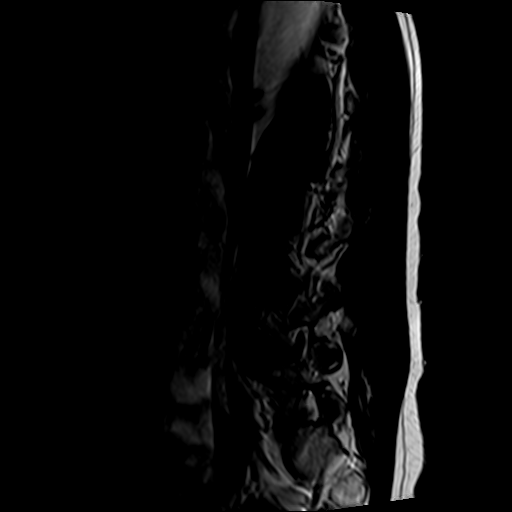
[im 7/16]
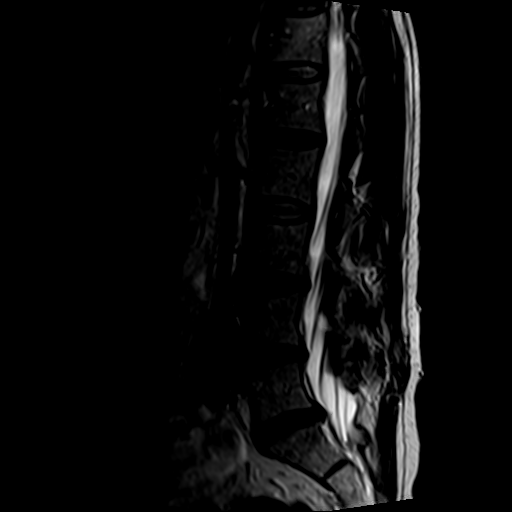
[im 10/16]
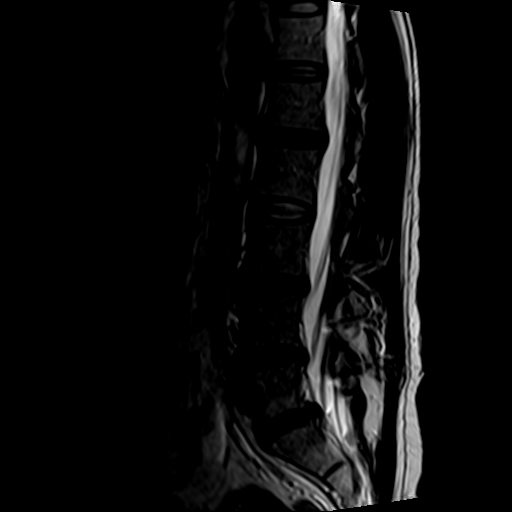
[im 13/16]
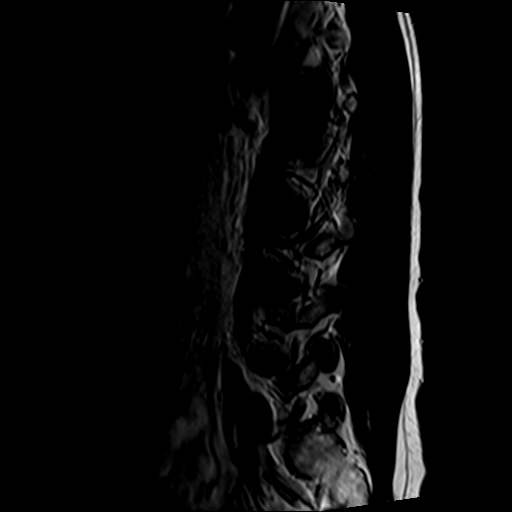
[im 16/16]
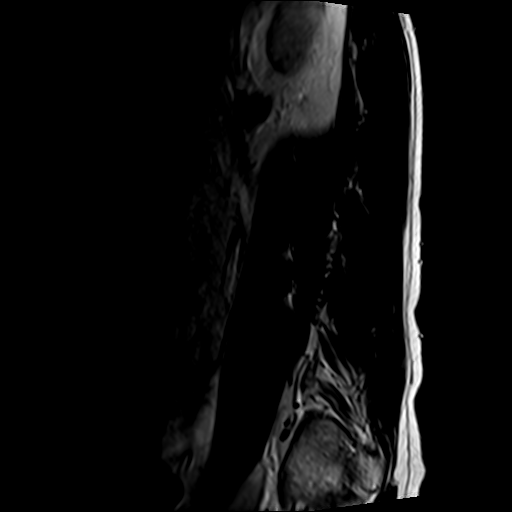

[Series 5: T1 · sagittal · 4.0mm · 0.53mm/px · 6 of 16 slices shown (1 of 2)]
[im 1/16]
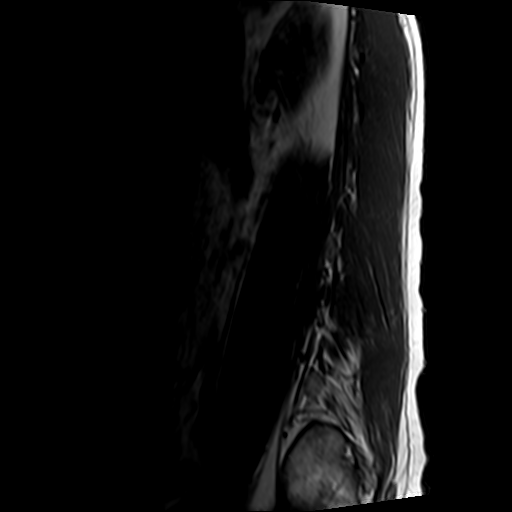
[im 4/16]
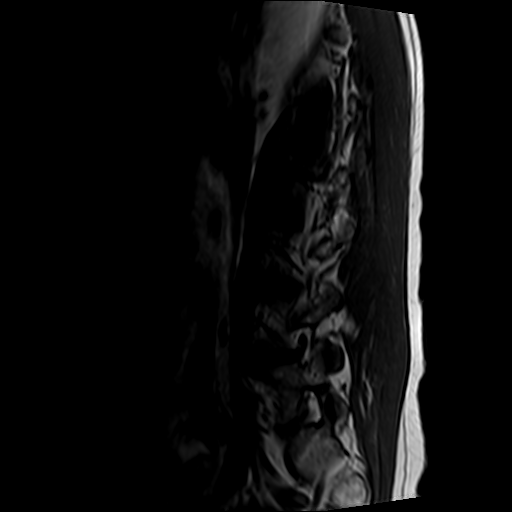
[im 7/16]
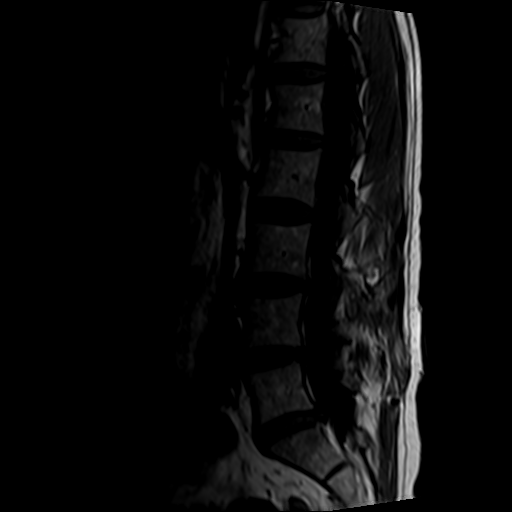
[im 10/16]
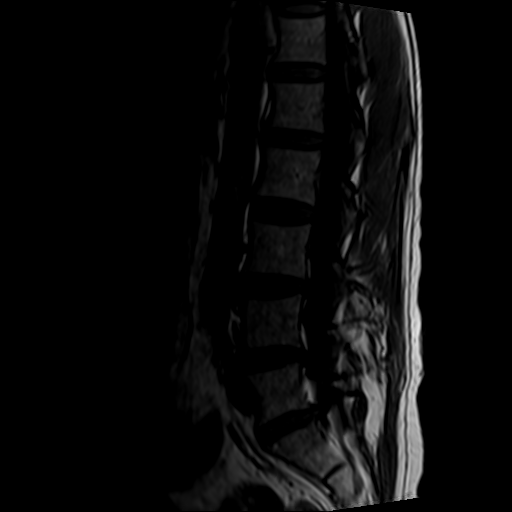
[im 13/16]
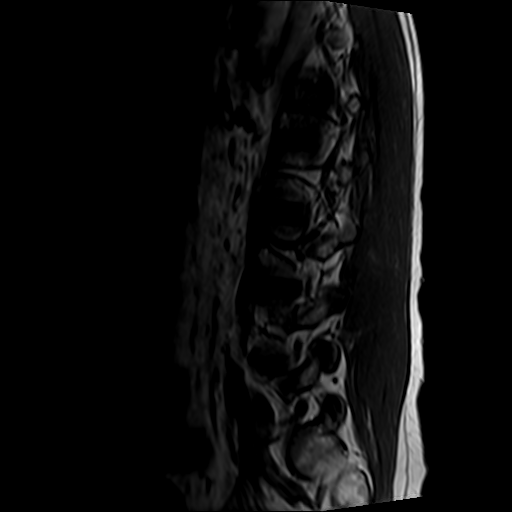
[im 16/16]
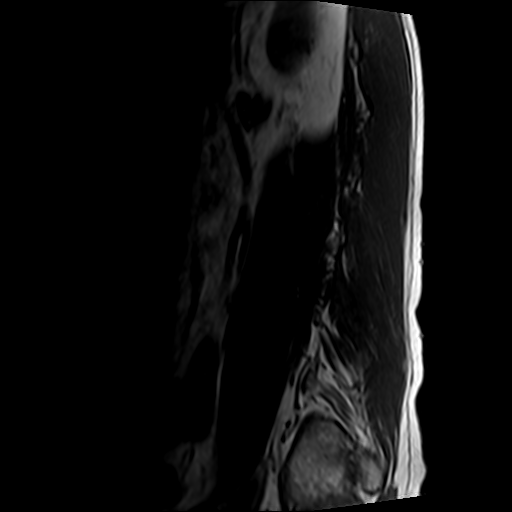

[Series 6: T2 · axial · 4.0mm · 0.70mm/px · z∈[-142,+76]mm · 9 of 40 slices shown (2 of 2)]
[im 1/40]
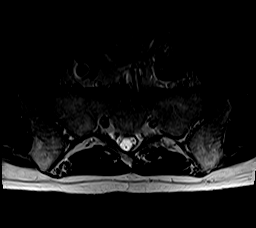
[im 6/40]
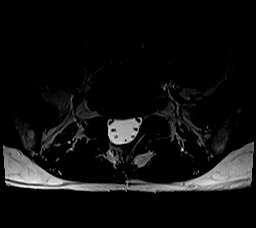
[im 12/40]
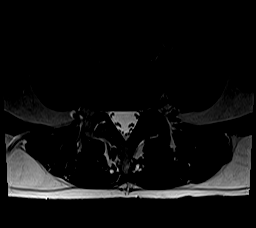
[im 17/40]
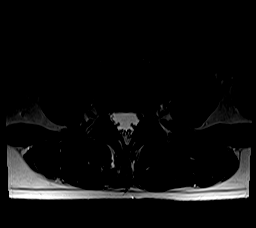
[im 20/40]
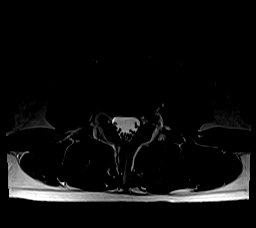
[im 23/40]
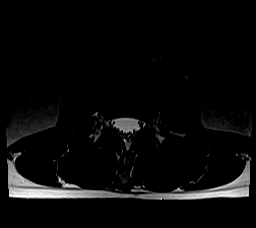
[im 28/40]
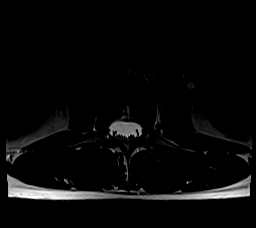
[im 34/40]
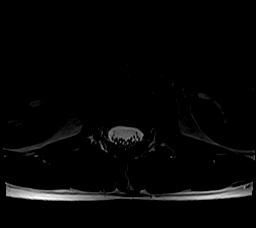
[im 40/40]
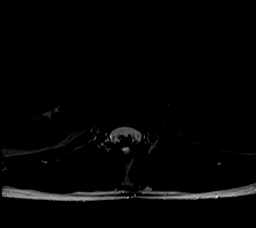

[Series 7: T1 · axial · 4.0mm · 0.35mm/px · z∈[-142,+45]mm · 4 of 40 slices shown (2 of 2)]
[im 1/40]
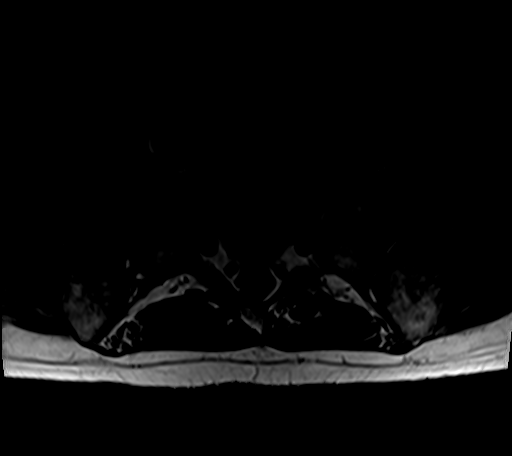
[im 6/40]
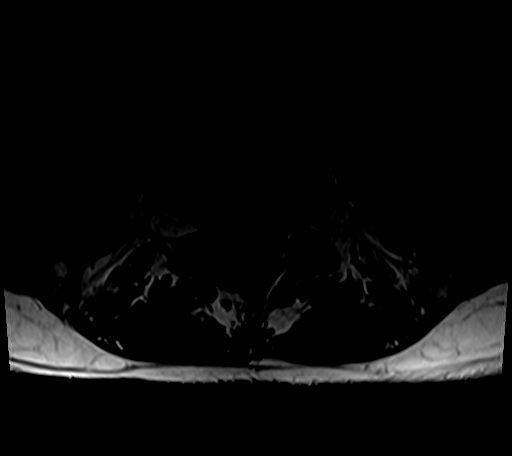
[im 20/40]
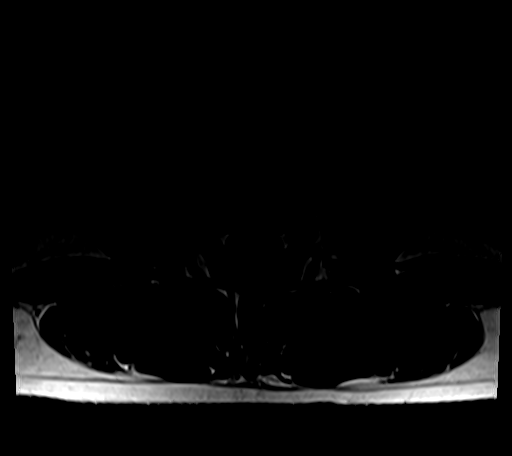
[im 34/40]
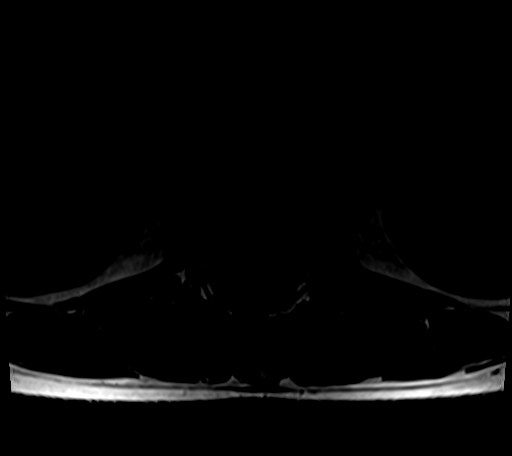

[25 of 48 positions shown; findings below may reference images not displayed]

Intraoperative lumbar radiograph 11/08/2016. Lumbar MRI
01/21/2016. CT Abdomen and Pelvis 10/08/2020.
FINDINGS: Segmentation: Normal on the comparison CT, which is the same
numbering system used on the 7713 MRI and appears concordant with
the thoracic numbering today.

Alignment: Stable lumbar lordosis since 7713. No significant
spondylolisthesis.

Vertebrae: No marrow edema or evidence of acute osseous abnormality.
Visualized bone marrow signal is within normal limits. Intact
visible sacrum and SI joints.

Conus medullaris and cauda equina: Conus well visualized and extends
to the T12-L1 level. No lower spinal cord or conus signal
abnormality. Capacious lumbar spinal canal. Cauda equina nerve roots
appear normal.

Paraspinal and other soft tissues: Negative.

Disc levels:

T12-L1:  Negative.

L1-L2: Disc desiccation since 7713. Subtle central disc protrusion
on series 6, image 9 is new. No stenosis.

L2-L3:  Stable, negative.

L3-L4: Mild disc desiccation and minimal increased circumferential
disc bulging since 7713. Borderline to mild bilateral L3 foraminal
stenosis has not significantly changed. No spinal or lateral recess
stenosis.

L4-L5: Chronic disc desiccation, posterior annular fissure of the
disc. Broad-based and slightly caudal posterior disc bulge or
protrusion has not significantly changed. Capacious spinal canal
with no spinal or convincing lateral recess stenosis. Borderline to
mild L4 foraminal stenosis appears stable and greater on the left.

L5-S1: Chronic postoperative changes to the right lamina here. Right
eccentric disc bulging and endplate spurring with mild architectural
distortion at the right lateral recess. No convincing lateral recess
stenosis. No spinal stenosis. No foraminal stenosis.
IMPRESSION: 1. Chronic right eccentric disc osteophyte complex and postoperative
changes on the right at L5-S1 with mild architectural distortion at
the right lateral recess but no convincing stenosis.

2. Capacious lumbar spinal canal. L4-L5 disc degeneration appears
stable, including posterior annular fissure, but without convincing
lateral recess stenosis.
Borderline to mild bilateral L3 and L4 neural foraminal stenosis is
stable.

3. Mild disc degeneration at L1-L2 and L3-L4 since 7713, but no
stenosis.

## 2022-07-01 ENCOUNTER — Encounter: Payer: Self-pay | Admitting: Gastroenterology

## 2022-07-01 ENCOUNTER — Ambulatory Visit (INDEPENDENT_AMBULATORY_CARE_PROVIDER_SITE_OTHER): Payer: 59 | Admitting: Gastroenterology

## 2022-07-01 ENCOUNTER — Telehealth: Payer: Self-pay

## 2022-07-01 VITALS — BP 100/62 | HR 86 | Ht 71.0 in | Wt 187.2 lb

## 2022-07-01 DIAGNOSIS — Z860101 Personal history of adenomatous and serrated colon polyps: Secondary | ICD-10-CM | POA: Insufficient documentation

## 2022-07-01 DIAGNOSIS — D124 Benign neoplasm of descending colon: Secondary | ICD-10-CM

## 2022-07-01 DIAGNOSIS — D126 Benign neoplasm of colon, unspecified: Secondary | ICD-10-CM | POA: Insufficient documentation

## 2022-07-01 DIAGNOSIS — Z86010 Personal history of colonic polyps: Secondary | ICD-10-CM | POA: Insufficient documentation

## 2022-07-01 MED ORDER — NA SULFATE-K SULFATE-MG SULF 17.5-3.13-1.6 GM/177ML PO SOLN: 1.0000 | ORAL | 0 refills | Status: DC

## 2022-07-01 NOTE — Telephone Encounter (Signed)
   Patient Name: Jeff Barnes  DOB: [birth year] MRN: [id number]  Primary Cardiologist: Elouise Munroe, MD  Chart reviewed as part of pre-operative protocol coverage. Pharmacy clearance only. [name] has a hx of CAD with NSTEMI 08/2019 with DES to ostial LAD lesion. Per Dr. Delphina Cahill previous recommendations, may hold Brilinta 5 days prior to planned procedure. Given CAD, prefer to continue ASA throughout perioperative period. However, if necessary may hold for 5-7 days.   Will route to surgical team so they are aware and remove from preop pool  Loel Dubonnet, NP 07/01/2022, 12:03 PM

## 2022-07-01 NOTE — Progress Notes (Signed)
GASTROENTEROLOGY OUTPATIENT CLINIC VISIT   Primary Care Provider Carol Ada, [place] [address] [phone]   Patient Profile: Jeff Barnes is a 51 y.o. male with a pmh significant for CAD (status post PCI on Brilinta), hyperlipidemia, prior skin cancer, GERD, colon polyps (TAs).  The patient presents to the [facility] for an evaluation and management of problem(s) noted below:  Problem List 1. Adenoma of descending colon   2. Hx of adenomatous colonic polyps      History of Present Illness Please see prior notes for full details of HPI.  Interval History The patient returns for follow-up.  I last saw him in September 2022 where a piecemeal large resection of the descending colon polyp was performed.  He did well after his procedure and did not have any evidence of postprocedural complications.  Over the last year he has done well.  There have been no changes in his bowel habits or any blood in his stools.  His GERD symptoms are stable as previously with Dr. Alessandra Bevels.  GI Review of Systems Positive as above Negative for odynophagia, dysphagia, abdominal pain, nausea, vomiting, melena, hematochezia  Review of Systems General: Denies fevers/chills/weight loss unintentionally Cardiovascular: Denies chest pain Pulmonary: Denies shortness of breath Gastroenterological: See HPI Genitourinary: Denies darkened urine Hematological: Positive for history of easy bruising/bleeding due to antiplatelet therapy Dermatological: Denies jaundice Psychological: Mood is stable   Medications Current Outpatient Medications  Medication Sig Dispense Refill   aspirin 81 MG chewable tablet Chew 1 tablet (81 mg total) by mouth daily. 90 tablet 3   atorvastatin (LIPITOR) 80 MG tablet Take 1 tablet (80 mg total) by mouth daily. 30 tablet 11   carvedilol (COREG) 6.25 MG tablet Take 1 tablet (6.25 mg total) by mouth 2 (two) times  daily with a meal. 60 tablet 11   esomeprazole (NEXIUM) 20 MG capsule Take 20 mg by mouth daily.      ezetimibe (ZETIA) 10 MG tablet Take 1 tablet (10 mg total) by mouth daily. 90 tablet 3   fexofenadine (ALLEGRA) 180 MG tablet Take 180 mg by mouth in the morning.     hyoscyamine (LEVSIN SL) 0.125 MG SL tablet Place 0.125 mg under the tongue 3 (three) times daily as needed for cramping.     Na Sulfate-K Sulfate-Mg Sulf (SUPREP BOWEL PREP KIT) 17.5-3.13-1.6 GM/177ML SOLN Take 1 kit by mouth as directed. For colonoscopy prep 354 mL 0   nitroGLYCERIN (NITROSTAT) 0.4 MG SL tablet Place 1 tablet (0.4 mg total) under the tongue every 5 (five) minutes x 3 doses as needed for chest pain. 25 tablet 1   sacubitril-valsartan (ENTRESTO) 24-26 MG TAKE 1 TABLET BY MOUTH TWICE A DAY 180 tablet 3   sildenafil (VIAGRA) 100 MG tablet Take 100 mg by mouth daily as needed for erectile dysfunction.     ticagrelor (BRILINTA) 60 MG TABS tablet Take 1 tablet (60 mg total) by mouth 2 (two) times daily. 180 tablet 3   No current facility-administered medications for this visit.    Allergies No Known Allergies  Histories Past Medical History:  Diagnosis Date   Cancer ([place])    skin cancer on left face   Coronary artery disease    GERD (gastroesophageal reflux disease)    H/O scarlet fever    Myocardial infarction [facility])    November   Pneumonia    Past Surgical History:  Procedure Laterality Date   BACK SURGERY  CARDIAC CATHETERIZATION     COLONOSCOPY WITH PROPOFOL N/A 05/31/2021   Procedure: COLONOSCOPY WITH PROPOFOL;  Surgeon: Rush Landmark Telford Nab., MD;  [facility]: [facility];  Service: Gastroenterology;  Laterality: N/A;   CORONARY STENT INTERVENTION  08/06/2019   CORONARY STENT INTERVENTION N/A 08/06/2019   Procedure: CORONARY STENT INTERVENTION;  Surgeon: Troy Sine, MD;  Location: [place] [facility];  Service: Cardiovascular;  Laterality: N/A;   ENDOSCOPIC MUCOSAL RESECTION N/A 05/31/2021    Procedure: ENDOSCOPIC MUCOSAL RESECTION;  Surgeon: Rush Landmark Telford Nab., MD;  [facility];  Service: Gastroenterology;  Laterality: N/A;   HEMOSTASIS CLIP PLACEMENT  05/31/2021   Procedure: HEMOSTASIS CLIP PLACEMENT;  Surgeon: Irving Copas., MD;  [facility]: [facility];  Service: Gastroenterology;;   INGUINAL HERNIA REPAIR Right 03/05/2020   Procedure: RIGHT OPEN INGUINAL HERNIA REPAIR WITH MESH;  Surgeon: Kinsinger, Arta Bruce, MD;  Location: [facility];  Service: General;  Laterality: Right;   LEFT HEART CATH AND CORONARY ANGIOGRAPHY N/A 08/06/2019   Procedure: LEFT HEART CATH AND CORONARY ANGIOGRAPHY;  Surgeon: Troy Sine, MD;  Location: [place] [facility];  Service: Cardiovascular;  Laterality: N/A;   POLYPECTOMY  05/31/2021   Procedure: POLYPECTOMY;  Surgeon: Rush Landmark Telford Nab., MD;  Location: [facility];  Service: Gastroenterology;;   Lia Foyer LIFTING INJECTION  05/31/2021   Procedure: SUBMUCOSAL LIFTING INJECTION;  Surgeon: Irving Copas., MD;  Location: [facility];  Service: Gastroenterology;;   Social History   Socioeconomic History   Marital status: Married    Spouse name: Not on file   Number of children: 2   Years of education: 16   Highest education level: Bachelor's degree (e.g., BA, AB, BS)  Occupational History   Not on file  Tobacco Use   Smoking status: Never   Smokeless tobacco: Never  Vaping Use   Vaping Use: Never used  Substance and Sexual Activity   Alcohol use: Yes    Comment: occas.   Drug use: Never   Sexual activity: Yes  Other Topics Concern   Not on file  Social History Narrative   Not on file   Social Determinants of Health   Financial Resource Strain: Not on file  Food Insecurity: Not on file  Transportation Needs: Not on file  Physical Activity: Not on file  Stress: Not on file  Social Connections: Not on file  Intimate Partner Violence: Not on file   Family History  Problem Relation Age of Onset    Cancer Father    CAD Father    Colon cancer Neg Hx    Esophageal cancer Neg Hx    Inflammatory bowel disease Neg Hx    Liver disease Neg Hx    Pancreatic cancer Neg Hx    Stomach cancer Neg Hx    Rectal cancer Neg Hx    I have reviewed his medical, social, and family history in detail and updated the electronic medical record as necessary.    PHYSICAL EXAMINATION  BP 100/62   Pulse 86   Ht _0  (1.803 m)   Wt 187 lb 3.2 oz (84.9 kg)   SpO2 96%   BMI 26.11 kg/m  Wt Readings from Last 3 Encounters:  07/01/22 187 lb 3.2 oz (84.9 kg)  12/30/21 182 lb (82.6 kg)  09/15/21 180 lb (81.6 kg)  GEN: NAD, appears stated age, doesn't appear chronically ill PSYCH: Cooperative, without pressured speech EYE: Conjunctivae pink, sclerae anicteric ENT: MMM CV: Nontachycardic RESP: No audible wheezing GI: NABS, soft, nontender,  without rebound MSK/EXT: No lower extremity edema SKIN: No jaundice NEURO:  Alert & Oriented x 3, no focal deficits   REVIEW OF DATA  I reviewed the following data at the time of this encounter:  GI Procedures and Studies  September 2022 Colonoscopy - Hemorrhoids found on digital rectal exam. - The examined portion of the ileum was normal. - One 30 mm polyp in the descending colon, removed with mucosal resection. Resected and retrieved. Treated with STSC to margin. Clips (MR conditional) were placed. - One 3 mm polyp at the recto-sigmoid colon, removed with a cold snare. Resected and retrieved. - Normal mucosa in the entire examined colon otherwise. - Non-bleeding non-thrombosed external and internal hemorrhoids.  Pathology FINAL MICROSCOPIC DIAGNOSIS:  A. COLON, DESCENDING, POLYPECTOMY EMR:  - Tubular adenoma(s)  - Negative for high-grade dysplasia or malignancy  B. COLON, RECTOSIGMOID, POLYPECTOMY:  - Polypoid colonic mucosa with congestion and mild hyperplastic changes  - Negative for dysplasia   Laboratory Studies  Reviewed those in epic  Imaging  Studies  No relevant studies to review   ASSESSMENT  Mr. Delman is a 51 y.o. male with a pmh significant for CAD (status post PCI on Brilinta), hyperlipidemia, prior skin cancer, GERD, colon polyps (TAs).  The patient is seen today for evaluation and management of:  1. Adenoma of descending colon   2. Hx of adenomatous colonic polyps    The patient is clinically and hemodynamically stable.  He is due for follow-up of his large descending colon polyp piecemeal resection from 2022.  Otherwise he has been doing well without any other signs or symptoms of issues.  We will plan a follow-up colonoscopy this year.  No other significant changes from a GI perspective with stable upper GI GERD symptoms that are controlled.  We will be prepared if necessary for other advanced resection techniques as had been previously discussed at initial consultation.  All patient questions were answered to the best of my ability, and the patient agrees to the aforementioned plan of action with follow-up as indicated.   PLAN  Proceed with scheduling colonoscopy with EMR 75-minute case Obtain cardiology approval for ticagrelor hold Follow-up to be dictated based on results of colonoscopy   Orders Placed This Encounter  Procedures   Procedural/ Surgical Case Request: ENDOSCOPIC MUCOSAL RESECTION   Ambulatory referral to Gastroenterology    New Prescriptions   NA SULFATE-K SULFATE-MG SULF (SUPREP BOWEL PREP KIT) 17.5-3.13-1.6 GM/177ML SOLN    Take 1 kit by mouth as directed. For colonoscopy prep   Modified Medications   No medications on file    Planned Follow Up No follow-ups on file.   Total Time in Face-to-Face and in Coordination of Care for patient including independent/personal interpretation/review of prior testing, medical history, examination, medication adjustment, communicating results with the patient directly, and documentation with the EHR is 20 minutes.   Justice Britain, MD [place]  Gastroenterology Advanced Endoscopy Office # [phone]

## 2022-07-01 NOTE — Patient Instructions (Signed)
You have been scheduled for a colonoscopy. Please follow written instructions given to you at your visit today.  Please pick up your prep supplies at the pharmacy within the next 1-3 days. If you use inhalers (even only as needed), please bring them with you on the day of your procedure.  We have sent the following medications to your pharmacy for you to pick up at your convenience: Suprep   _______________________________________________________  If you are age 7 or older, your body mass index should be between 23-30. Your Body mass index is 26.11 kg/m. If this is out of the aforementioned range listed, please consider follow up with your Primary Care Provider.  If you are age 62 or younger, your body mass index should be between 19-25. Your Body mass index is 26.11 kg/m. If this is out of the aformentioned range listed, please consider follow up with your Primary Care Provider.   ________________________________________________________  The [facility] providers would like to encourage you to use [facility] to communicate with providers for non-urgent requests or questions.  Due to long hold times on the telephone, sending your provider a message by [facility] may be a faster and more efficient way to get a response.  Please allow 48 business hours for a response.  Please remember that this is for non-urgent requests.  _______________________________________________________  Thank you for choosing me and [facility].  Dr. Rush Landmark

## 2022-07-01 NOTE — Telephone Encounter (Signed)
Request for surgical clearance:     Endoscopy Procedure  What type of surgery is being performed?     Colon+ EMR  When is this surgery scheduled?     08/22/22  What type of clearance is required ?   Pharmacy  Are there any medications that need to be held prior to surgery and how long? Ticagrelor x5 days   Practice name and name of physician performing surgery?      [facility]  What is your office phone and fax number?      Phone- [phone]  [phone]  Anesthesia type (None, local, MAC, general) ?       MAC

## 2022-07-08 NOTE — Telephone Encounter (Signed)
Patient has been informed okay to hold Ticagrelor x5 days prior to procedure. Pt will continue ASA, per Ronnette Hila NP. Pt voiced understanding.

## 2022-07-26 ENCOUNTER — Telehealth: Payer: Self-pay | Admitting: Gastroenterology

## 2022-07-26 NOTE — Telephone Encounter (Signed)
Inbound call from patient stating that he needed to reschedule his colonoscopy for 12/18 with Dr. Rush Landmark at [facility]. Patient is requesting a call back to reschedule.

## 2022-07-26 NOTE — Telephone Encounter (Signed)
The pt has been rescheduled to 09/22/22 at 930 am  He has been re instructed and all questions answered.Marland Kitchen

## 2022-09-07 ENCOUNTER — Other Ambulatory Visit: Payer: Self-pay | Admitting: Cardiology

## 2022-09-07 DIAGNOSIS — E785 Hyperlipidemia, unspecified: Secondary | ICD-10-CM

## 2022-09-16 ENCOUNTER — Encounter (HOSPITAL_COMMUNITY): Payer: Self-pay | Admitting: Gastroenterology

## 2022-09-21 NOTE — Anesthesia Preprocedure Evaluation (Addendum)
Anesthesia Evaluation  Patient identified by MRN, date of birth, ID band Patient awake    Reviewed: Allergy & Precautions, H&P , NPO status , Patient's Chart, lab work & pertinent test results  Airway Mallampati: I  TM Distance: >3 FB Neck ROM: Full    Dental  (+) Dental Advisory Given, Teeth Intact   Pulmonary pneumonia   Pulmonary exam normal breath sounds clear to auscultation       Cardiovascular METS: > 9 Mets hypertension, Pt. on medications and Pt. on home beta blockers + CAD, + Past MI and + Cardiac Stents  Normal cardiovascular exam Rhythm:Regular Rate:Normal  Stress Myoview 09/2021   The study is normal. The study is low risk.   No ST deviation was noted.   LV perfusion is normal. There is no evidence of ischemia. There is no evidence of infarction.   Left ventricular function is normal. Nuclear stress EF: 58 %. The left ventricular ejection fraction is normal (55-65%). End diastolic cavity size is normal. End systolic cavity size is normal.   Prior study available for comparison from 10/10/2019.   1. Reduced counts in the inferior segments that improve on stress imaging and normal wall motion consistent with diaphragm attenuation.  2. Normal study without evidence of ischemia or infarction.  3. Normal LVEF, 58%.  4. Excellent exercise capacity (9:59 min:s; 11.7 METS).  5. Normal HR/BP response to exercise.  6. This is a low-risk study.     Echo 07/2020  1. Left ventricular ejection fraction, by estimation, is 55 to 60%. The left ventricle has normal function. The left ventricle has no regional wall motion abnormalities. Left ventricular diastolic parameters were normal.   2. Right ventricular systolic function is normal. The right ventricular size is normal.   3. The mitral valve is normal in structure. Trivial mitral valve regurgitation. No evidence of mitral stenosis.   4. The aortic valve is tricuspid. Aortic  valve regurgitation is not visualized. No aortic stenosis is present.   5. The right lower pulmonary vein is abnormal. The inferior vena cava is normal in size with greater than 50% respiratory variability, suggesting right atrial pressure of 3 mmHg.     Neuro/Psych negative neurological ROS  negative psych ROS   GI/Hepatic Neg liver ROS,GERD  Medicated,,  Endo/Other  negative endocrine ROS    Renal/GU negative Renal ROS     Musculoskeletal   Abdominal   Peds  Hematology negative hematology ROS (+)   Anesthesia Other Findings   Reproductive/Obstetrics                             Anesthesia Physical Anesthesia Plan  ASA: 3  Anesthesia Plan: MAC   Post-op Pain Management:    Induction: Intravenous  PONV Risk Score and Plan: 2 and Propofol infusion and Treatment may vary due to age or medical condition  Airway Management Planned: Simple Face Mask  Additional Equipment:   Intra-op Plan:   Post-operative Plan:   Informed Consent: I have reviewed the patients History and Physical, chart, labs and discussed the procedure including the risks, benefits and alternatives for the proposed anesthesia with the patient or authorized representative who has indicated his/her understanding and acceptance.     Dental advisory given  Plan Discussed with: CRNA  Anesthesia Plan Comments:         Anesthesia Quick Evaluation

## 2022-09-22 ENCOUNTER — Encounter (HOSPITAL_COMMUNITY)
Admission: RE | Disposition: A | Discharge status: Home / Self Care | Payer: Self-pay | Source: Home / Self Care | Attending: Gastroenterology

## 2022-09-22 ENCOUNTER — Ambulatory Visit (HOSPITAL_COMMUNITY)
Admission: RE | Admit: 2022-09-22 | Discharge: 2022-09-22 | Disposition: A | Discharge status: Home / Self Care | Payer: 59 | Attending: Gastroenterology | Admitting: Gastroenterology

## 2022-09-22 ENCOUNTER — Ambulatory Visit (HOSPITAL_COMMUNITY): Payer: 59 | Admitting: Anesthesiology

## 2022-09-22 ENCOUNTER — Other Ambulatory Visit: Payer: Self-pay

## 2022-09-22 ENCOUNTER — Ambulatory Visit (HOSPITAL_BASED_OUTPATIENT_CLINIC_OR_DEPARTMENT_OTHER): Payer: 59 | Admitting: Anesthesiology

## 2022-09-22 ENCOUNTER — Encounter (HOSPITAL_COMMUNITY): Payer: Self-pay | Admitting: Gastroenterology

## 2022-09-22 DIAGNOSIS — Z86010 Personal history of colonic polyps: Secondary | ICD-10-CM

## 2022-09-22 DIAGNOSIS — Z860101 Personal history of adenomatous and serrated colon polyps: Secondary | ICD-10-CM

## 2022-09-22 DIAGNOSIS — I10 Essential (primary) hypertension: Secondary | ICD-10-CM

## 2022-09-22 DIAGNOSIS — D120 Benign neoplasm of cecum: Secondary | ICD-10-CM | POA: Diagnosis not present

## 2022-09-22 DIAGNOSIS — K641 Second degree hemorrhoids: Secondary | ICD-10-CM | POA: Insufficient documentation

## 2022-09-22 DIAGNOSIS — I2510 Atherosclerotic heart disease of native coronary artery without angina pectoris: Secondary | ICD-10-CM | POA: Insufficient documentation

## 2022-09-22 DIAGNOSIS — I252 Old myocardial infarction: Secondary | ICD-10-CM | POA: Diagnosis not present

## 2022-09-22 DIAGNOSIS — K219 Gastro-esophageal reflux disease without esophagitis: Secondary | ICD-10-CM | POA: Insufficient documentation

## 2022-09-22 DIAGNOSIS — D127 Benign neoplasm of rectosigmoid junction: Secondary | ICD-10-CM | POA: Diagnosis not present

## 2022-09-22 DIAGNOSIS — K649 Unspecified hemorrhoids: Secondary | ICD-10-CM

## 2022-09-22 DIAGNOSIS — K648 Other hemorrhoids: Secondary | ICD-10-CM | POA: Insufficient documentation

## 2022-09-22 DIAGNOSIS — K635 Polyp of colon: Secondary | ICD-10-CM

## 2022-09-22 DIAGNOSIS — D124 Benign neoplasm of descending colon: Secondary | ICD-10-CM | POA: Insufficient documentation

## 2022-09-22 DIAGNOSIS — Z1211 Encounter for screening for malignant neoplasm of colon: Secondary | ICD-10-CM | POA: Diagnosis present

## 2022-09-22 DIAGNOSIS — Z09 Encounter for follow-up examination after completed treatment for conditions other than malignant neoplasm: Secondary | ICD-10-CM | POA: Diagnosis not present

## 2022-09-22 HISTORY — PX: COLONOSCOPY WITH PROPOFOL: SHX5780

## 2022-09-22 HISTORY — PX: POLYPECTOMY: SHX5525

## 2022-09-22 LAB — POCT I-STAT, CHEM 8
BUN: 5 mg/dL — ABNORMAL LOW (ref 6–20)
Calcium, Ion: 1.14 mmol/L — ABNORMAL LOW (ref 1.15–1.40)
Chloride: 104 mmol/L (ref 98–111)
Creatinine, Ser: 0.7 mg/dL (ref 0.61–1.24)
Glucose, Bld: 92 mg/dL (ref 70–99)
HCT: 46 % (ref 39.0–52.0)
Hemoglobin: 15.6 g/dL (ref 13.0–17.0)
Potassium: 3.9 mmol/L (ref 3.5–5.1)
Sodium: 139 mmol/L (ref 135–145)
TCO2: 23 mmol/L (ref 22–32)

## 2022-09-22 SURGERY — COLONOSCOPY WITH PROPOFOL
Anesthesia: Monitor Anesthesia Care

## 2022-09-22 MED ORDER — TICAGRELOR 60 MG PO TABS: 60.0000 mg | ORAL_TABLET | Freq: Two times a day (BID) | ORAL | 3 refills | Status: DC

## 2022-09-22 MED ORDER — SODIUM CHLORIDE 0.9 % IV SOLN: INTRAVENOUS | Status: DC

## 2022-09-22 MED ORDER — PROPOFOL 10 MG/ML IV BOLUS
INTRAVENOUS | Status: DC | PRN
  Administered 2022-09-22: 30 mg via INTRAVENOUS
  Administered 2022-09-22: 150 mg via INTRAVENOUS
  Administered 2022-09-22: 30 mg via INTRAVENOUS

## 2022-09-22 MED ORDER — LACTATED RINGERS IV SOLN: INTRAVENOUS | Status: DC

## 2022-09-22 SURGICAL SUPPLY — 22 items

## 2022-09-22 NOTE — H&P (Signed)
GASTROENTEROLOGY PROCEDURE H&P NOTE   Primary Care Physician: Carol Ada, MD  HPI: Jeff Barnes is a 51 y.o. male who presents for Colonoscopy for follow up of previously resected TA of the DC.  Past Medical History:  Diagnosis Date   Cancer [facility])    skin cancer on left face   Coronary artery disease    GERD (gastroesophageal reflux disease)    H/O scarlet fever    Myocardial infarction [facility])    November   Pneumonia    Past Surgical History:  Procedure Laterality Date   BACK SURGERY     CARDIAC CATHETERIZATION     COLONOSCOPY WITH PROPOFOL N/A 05/31/2021   Procedure: COLONOSCOPY WITH PROPOFOL;  Surgeon: Irving Copas., MD;  Location: [facility];  Service: Gastroenterology;  Laterality: N/A;   CORONARY STENT INTERVENTION  08/06/2019   CORONARY STENT INTERVENTION N/A 08/06/2019   Procedure: CORONARY STENT INTERVENTION;  Surgeon: Troy Sine, MD;  Location: [place] CV LAB;  Service: Cardiovascular;  Laterality: N/A;   ENDOSCOPIC MUCOSAL RESECTION N/A 05/31/2021   Procedure: ENDOSCOPIC MUCOSAL RESECTION;  Surgeon: Rush Landmark Telford Nab., MD;  [facility];  Service: Gastroenterology;  Laterality: N/A;   HEMOSTASIS CLIP PLACEMENT  05/31/2021   Procedure: HEMOSTASIS CLIP PLACEMENT;  Surgeon: Irving Copas., MD;  [facility];  Service: Gastroenterology;;   INGUINAL HERNIA REPAIR Right 03/05/2020   Procedure: RIGHT OPEN INGUINAL HERNIA REPAIR WITH MESH;  Surgeon: Kinsinger, Arta Bruce, MD;  [facility];  Service: General;  Laterality: Right;   LEFT HEART CATH AND CORONARY ANGIOGRAPHY N/A 08/06/2019   Procedure: LEFT HEART CATH AND CORONARY ANGIOGRAPHY;  Surgeon: Troy Sine, MD;  Location: [place] [facility];  Service: Cardiovascular;  Laterality: N/A;   POLYPECTOMY  05/31/2021   Procedure: POLYPECTOMY;  Surgeon: Rush Landmark Telford Nab., MD;  Location: [facility];  Service: Gastroenterology;;   Lia Foyer LIFTING  INJECTION  05/31/2021   Procedure: SUBMUCOSAL LIFTING INJECTION;  Surgeon: Irving Copas., MD;  Location: [facility];  Service: Gastroenterology;;   Current Facility-Administered Medications  Medication Dose Route Frequency Provider Last Rate Last Admin   0.9 %  sodium chloride infusion   Intravenous Continuous Mansouraty, Telford Nab., MD        Current Facility-Administered Medications:    0.9 %  sodium chloride infusion, , Intravenous, Continuous, Mansouraty, Telford Nab., MD No Known Allergies Family History  Problem Relation Age of Onset   Cancer Father    CAD Father    Colon cancer Neg Hx    Esophageal cancer Neg Hx    Inflammatory bowel disease Neg Hx    Liver disease Neg Hx    Pancreatic cancer Neg Hx    Stomach cancer Neg Hx    Rectal cancer Neg Hx    Social History   Socioeconomic History   Marital status: Married    Spouse name: Not on file   Number of children: 2   Years of education: 16   Highest education level: Bachelor's degree (e.g., BA, AB, BS)  Occupational History   Not on file  Tobacco Use   Smoking status: Never   Smokeless tobacco: Never  Vaping Use   Vaping Use: Never used  Substance and Sexual Activity   Alcohol use: Yes    Comment: occas.   Drug use: Never   Sexual activity: Yes  Other Topics Concern   Not on file  Social History Narrative   Not on file   Social Determinants of  Health   Financial Resource Strain: Not on file  Food Insecurity: Not on file  Transportation Needs: Not on file  Physical Activity: Not on file  Stress: Not on file  Social Connections: Not on file  Intimate Partner Violence: Not on file    Physical Exam: Today's Vitals   09/16/22 1608  Weight: 79.4 kg   Body mass index is 24.41 kg/m. GEN: NAD EYE: Sclerae anicteric ENT: MMM CV: Non-tachycardic GI: Soft, NT/ND NEURO:  Alert & Oriented x 3  Lab Results: No results for input(s): "WBC", "HGB", "HCT", "PLT" in the last 72 hours. BMET No  results for input(s): "NA", "K", "CL", "CO2", "GLUCOSE", "BUN", "CREATININE", "CALCIUM" in the last 72 hours. LFT No results for input(s): "PROT", "ALBUMIN", "AST", "ALT", "ALKPHOS", "BILITOT", "BILIDIR", "IBILI" in the last 72 hours. PT/INR No results for input(s): "LABPROT", "INR" in the last 72 hours.   Impression / Plan: This is a 52 y.o.male who presents for Colonoscopy for follow up of previously resected TA of the DC.  The risks and benefits of endoscopic evaluation/treatment were discussed with the patient and/or family; these include but are not limited to the risk of perforation, infection, bleeding, missed lesions, lack of diagnosis, severe illness requiring hospitalization, as well as anesthesia and sedation related illnesses.  The patient's history has been reviewed, patient examined, no change in status, and deemed stable for procedure.  The patient and/or family is agreeable to proceed.    Justice Britain, MD [facility] Advanced Endoscopy Office # [phone]

## 2022-09-22 NOTE — Transfer of Care (Signed)
Immediate Anesthesia Transfer of Care Note  Patient: Jeff Barnes  Procedure(s) Performed: COLONOSCOPY WITH PROPOFOL POLYPECTOMY  Patient Location: PACU and Endoscopy Unit  Anesthesia Type:MAC  Level of Consciousness: awake, alert , and oriented  Airway & Oxygen Therapy: Patient Spontanous Breathing  Post-op Assessment: Report given to RN and Post -op Vital signs reviewed and stable  Post vital signs: Reviewed and stable  Last Vitals:  Vitals Value Taken Time  BP    Temp    Pulse 81 09/22/22 0908  Resp 17 09/22/22 0908  SpO2 99 % 09/22/22 0908  Vitals shown include unvalidated device data.  Last Pain:  Vitals:   09/22/22 0758  TempSrc: Temporal  PainSc: 0-No pain         Complications: No notable events documented.

## 2022-09-22 NOTE — Discharge Instructions (Signed)
YOU HAD AN ENDOSCOPIC PROCEDURE TODAY: Refer to the procedure report and other information in the discharge instructions given to you for any specific questions about what was found during the examination. If this information does not answer your questions, please call [place] office at [phone] to clarify.  ° °YOU SHOULD EXPECT: Some feelings of bloating in the abdomen. Passage of more gas than usual. Walking can help get rid of the air that was put into your GI tract during the procedure and reduce the bloating. If you had a lower endoscopy (such as a colonoscopy or flexible sigmoidoscopy) you may notice spotting of blood in your stool or on the toilet paper. Some abdominal soreness may be present for a day or two, also. ° °DIET: Your first meal following the procedure should be a light meal and then it is ok to progress to your normal diet. A half-sandwich or bowl of soup is an example of a good first meal. Heavy or fried foods are harder to digest and may make you feel nauseous or bloated. Drink plenty of fluids but you should avoid alcoholic beverages for 24 hours. If you had a esophageal dilation, please see attached instructions for diet.   ° °ACTIVITY: Your care partner should take you home directly after the procedure. You should plan to take it easy, moving slowly for the rest of the day. You can resume normal activity the day after the procedure however YOU SHOULD NOT DRIVE, use power tools, machinery or perform tasks that involve climbing or major physical exertion for 24 hours (because of the sedation medicines used during the test).  ° °SYMPTOMS TO REPORT IMMEDIATELY: °A gastroenterologist can be reached at any hour. Please call [phone]  for any of the following symptoms:  °Following lower endoscopy (colonoscopy, flexible sigmoidoscopy) °Excessive amounts of blood in the stool  °Significant tenderness, worsening of abdominal pains  °Swelling of the abdomen that is new, acute  °Fever of 100° or  higher  °Following upper endoscopy (EGD, EUS, ERCP, esophageal dilation) °Vomiting of blood or coffee ground material  °New, significant abdominal pain  °New, significant chest pain or pain under the shoulder blades  °Painful or persistently difficult swallowing  °New shortness of breath  °Black, tarry-looking or red, bloody stools ° °FOLLOW UP:  °If any biopsies were taken you will be contacted by phone or by letter within the next 1-3 weeks. Call [phone]  if you have not heard about the biopsies in 3 weeks.  °Please also call with any specific questions about appointments or follow up tests. ° °

## 2022-09-22 NOTE — Anesthesia Postprocedure Evaluation (Signed)
Anesthesia Post Note  Patient: Jeff Barnes  Procedure(s) Performed: COLONOSCOPY WITH PROPOFOL POLYPECTOMY     Patient location during evaluation: PACU Anesthesia Type: MAC Level of consciousness: awake and alert Pain management: pain level controlled Vital Signs Assessment: post-procedure vital signs reviewed and stable Respiratory status: spontaneous breathing Cardiovascular status: stable Anesthetic complications: no   No notable events documented.  Last Vitals:  Vitals:   09/22/22 0920 09/22/22 0930  BP: 106/64 113/74  Pulse: 69 68  Resp: (!) 23 20  Temp:    SpO2: 96% 100%    Last Pain:  Vitals:   09/22/22 0930  TempSrc:   PainSc: 0-No pain                 Nolon Nations

## 2022-09-22 NOTE — Op Note (Addendum)
[facility] Patient Name: Jeff Barnes Procedure Date: 09/22/2022 MRN: [id number] Attending MD: Justice Britain , MD, [id number] Date of Birth: April 04, 1971 CSN: [id number] Age: 52 Admit Type: Outpatient Procedure:                Colonoscopy Indications:              Surveillance: History of piecemeal removal adenoma                            of DC on last colonoscopy in 2022 Providers:                Justice Britain, MD, Fanny Skates RN, RN, Carlyn Reichert, RN, Darliss Cheney, Technician, Dellie Catholic Referring MD:             Otis Brace, MD, Judithann Sauger MD, MD Medicines:                Monitored Anesthesia Care Complications:            No immediate complications. Estimated Blood Loss:     Estimated blood loss was minimal. Procedure:                Pre-Anesthesia Assessment:                           - Prior to the procedure, a History and Physical                            was performed, and patient medications and                            allergies were reviewed. The patient's tolerance of                            previous anesthesia was also reviewed. The risks                            and benefits of the procedure and the sedation                            options and risks were discussed with the patient.                            All questions were answered, and informed consent                            was obtained. Prior Anticoagulants: The patient                            last took Brilinta (ticagrelor) 5 days prior to the                            procedure and has taken no anticoagulant or  antiplatelet agents except for aspirin. ASA Grade                            Assessment: II - A patient with mild systemic                            disease. After reviewing the risks and benefits,                            the patient was deemed in satisfactory condition to                             undergo the procedure.                           After obtaining informed consent, the colonoscope                            was passed under direct vision. Throughout the                            procedure, the patient's blood pressure, pulse, and                            oxygen saturations were monitored continuously. The                            CF-HQ190L ([id number]) Olympus colonoscope was                            introduced through the anus and advanced to the 3                            cm into the ileum. The colonoscopy was performed                            without difficulty. The patient tolerated the                            procedure. The quality of the bowel preparation was                            good. The terminal ileum, ileocecal valve,                            appendiceal orifice, and rectum were photographed. Scope In: 8:48:58 AM Scope Out: 9:02:02 AM Scope Withdrawal Time: 0 hours 10 minutes 10 seconds  Total Procedure Duration: 0 hours 13 minutes 4 seconds  Findings:      The digital rectal exam findings include hemorrhoids. Pertinent       negatives include no palpable rectal lesions.      The terminal ileum and ileocecal valve appeared normal.      Three sessile polyps were found in the recto-sigmoid colon, descending  colon and cecum. The polyps were 1 to 2 mm in size. These polyps were       removed with a cold snare. Resection and retrieval were complete.      A tattoo was seen in the descending colon. A post-polypectomy scar was       found at the tattoo site. There was no evidence of residual polyp tissue.      Normal mucosa was found in the entire colon otherwise.      Non-bleeding non-thrombosed internal hemorrhoids were found during       retroflexion, during perianal exam and during digital exam. The       hemorrhoids were Grade II (internal hemorrhoids that prolapse but reduce       spontaneously). Impression:                - Hemorrhoids found on digital rectal exam.                           - The examined portion of the ileum was normal.                           - Three 1 to 2 mm polyps at the recto-sigmoid                            colon, in the descending colon and in the cecum,                            removed with a cold snare. Resected and retrieved.                           - A tattoo was seen in the descending colon. A                            post-polypectomy scar was found at the tattoo site.                            There was no evidence of residual polyp tissue.                           - Normal mucosa in the entire examined colon                            otherwise.                           - Non-bleeding non-thrombosed internal hemorrhoids. Moderate Sedation:      Not Applicable - Patient had care per Anesthesia. Recommendation:           - The patient will be observed post-procedure,                            until all discharge criteria are met.                           - Discharge patient to home.                           -  Patient has a contact number available for                            emergencies. The signs and symptoms of potential                            delayed complications were discussed with the                            patient. Return to normal activities tomorrow.                            Written discharge instructions were provided to the                            patient.                           - High fiber diet.                           - Use FiberCon 1-2 tablets PO daily.                           - May restart Brilanta tomorrow.                           - Continue present medications.                           - Await pathology results.                           - Repeat colonoscopy in 3 years for surveillance,                            no matter final pathology. Do recommend that                            patient go no longer than every  5 years with                            history of advanced adenoma.                           - The findings and recommendations were discussed                            with the patient.                           - The findings and recommendations were discussed                            with the patient's family. Procedure Code(s):        --- Professional ---  45385, Colonoscopy, flexible; with removal of                            tumor(s), polyp(s), or other lesion(s) by snare                            technique Diagnosis Code(s):        --- Professional ---                           Z86.010, Personal history of colonic polyps                           K64.1, Second degree hemorrhoids                           D12.7, Benign neoplasm of rectosigmoid junction                           D12.4, Benign neoplasm of descending colon                           D12.0, Benign neoplasm of cecum CPT copyright 2022 American Medical Association. All rights reserved. The codes documented in this report are preliminary and upon coder review may  be revised to meet current compliance requirements. Justice Britain, MD 09/22/2022 9:17:40 AM Number of Addenda: 0

## 2022-09-22 NOTE — Anesthesia Procedure Notes (Signed)
Procedure Name: MAC Date/Time: 09/22/2022 8:35 AM  Performed by: [place] Jeff Barnes, CRNAPre-anesthesia Checklist: Patient identified, Emergency Drugs available, Suction available and Patient being monitored Patient Re-evaluated:Patient Re-evaluated prior to induction Oxygen Delivery Method: Simple face mask

## 2022-09-25 ENCOUNTER — Encounter (HOSPITAL_COMMUNITY): Payer: Self-pay | Admitting: Gastroenterology

## 2022-09-26 LAB — SURGICAL PATHOLOGY

## 2022-09-27 ENCOUNTER — Encounter: Payer: Self-pay | Admitting: Gastroenterology

## 2022-10-11 ENCOUNTER — Other Ambulatory Visit: Payer: Self-pay | Admitting: Internal Medicine

## 2022-10-13 ENCOUNTER — Encounter: Payer: Self-pay | Admitting: Internal Medicine

## 2022-10-13 MED ORDER — ENTRESTO 24-26 MG PO TABS: 1.0000 | ORAL_TABLET | Freq: Two times a day (BID) | ORAL | 3 refills | Status: DC

## 2022-10-13 MED ORDER — CARVEDILOL 6.25 MG PO TABS: 6.2500 mg | ORAL_TABLET | Freq: Two times a day (BID) | ORAL | 11 refills | Status: DC

## 2022-10-20 ENCOUNTER — Ambulatory Visit: Payer: 59 | Attending: Internal Medicine | Admitting: Internal Medicine

## 2022-10-20 ENCOUNTER — Encounter: Payer: Self-pay | Admitting: Internal Medicine

## 2022-10-20 VITALS — BP 114/78 | HR 83 | Ht 70.0 in | Wt 185.6 lb

## 2022-10-20 DIAGNOSIS — I214 Non-ST elevation (NSTEMI) myocardial infarction: Secondary | ICD-10-CM

## 2022-10-20 DIAGNOSIS — I2510 Atherosclerotic heart disease of native coronary artery without angina pectoris: Secondary | ICD-10-CM | POA: Diagnosis not present

## 2022-10-20 DIAGNOSIS — I255 Ischemic cardiomyopathy: Secondary | ICD-10-CM | POA: Diagnosis not present

## 2022-10-20 DIAGNOSIS — Z79899 Other long term (current) drug therapy: Secondary | ICD-10-CM

## 2022-10-20 DIAGNOSIS — E785 Hyperlipidemia, unspecified: Secondary | ICD-10-CM | POA: Diagnosis not present

## 2022-10-20 MED ORDER — LOSARTAN POTASSIUM 25 MG PO TABS: 25.0000 mg | ORAL_TABLET | Freq: Every day | ORAL | 1 refills | Status: DC

## 2022-10-20 NOTE — Patient Instructions (Addendum)
Medication Instructions:   STOP ENTRESTO   START: LOSARTAN 31m ONCE DAILY  STOP BRILLINTA   *If you need a refill on your cardiac medications before your next appointment, please call your pharmacy*  Lab Work: Please return for FASTING Blood Work AT NEXT AVAILABLE No appointment needed, lab here at the office is open Monday-Friday from 8AM to 4PM and closed daily for lunch from 12:45-1:45.   If you have labs (blood work) drawn today and your tests are completely normal, you will receive your results only by: MLago(if you have MyChart) OR A paper copy in the mail If you have any lab test that is abnormal or we need to change your treatment, we will call you to review the results.  Testing/Procedures: Your physician has requested that you have an echocardiogram in 6 MONTHS. Echocardiography is a painless test that uses sound waves to create images of your heart. It provides your doctor with information about the size and shape of your heart and how well your heart's chambers and valves are working. You may receive an ultrasound enhancing agent through an IV if needed to better visualize your heart during the echo.This procedure takes approximately one hour. There are no restrictions for this procedure. This will take place at the [address].   Follow-Up: At [facility] you and your health needs are our priority.  As part of our continuing mission to provide you with exceptional heart care, we have created designated Provider Care Teams.  These Care Teams include your primary Cardiologist (physician) and Advanced Practice Providers (APPs -  Physician Assistants and Nurse Practitioners) who all work together to provide you with the care you need, when you need it.   Your next appointment:   6 month(s) RIGHT AFTER ECHO   Provider:   GElouise Munroe MD

## 2022-10-20 NOTE — Progress Notes (Signed)
Cardiology Office Note:    Date:  10/20/2022   ID:  Jeff Barnes, DOB Mar 19, 1971, MRN [id number]  PCP:  Carol Ada, MD  Cardiologist:  Elouise Munroe, MD  Electrophysiologist:  None   Referring MD: Carol Ada, MD   Chief Complaint/Reason for Referral: Prior NSTEMI, ostial LAD stent, ischemic cardiomyopathy with recovery of EF  History of Present Illness:    Jeff Barnes is a 52 y.o. male with a history of GERD, hyperlipidemia, NSTEMI August 06, 2019 with reduced ejection fraction. Started on heart failure therapy and EF has recovered. He had done very well with physical activity after MI, participated in cardiac rehab. Had one episode of atypical chest pain, stress nuclear study was negative for ischemia.   At his last appointment he was feeling well. He was s/p colonoscopy, and scheduled for a biopsy of a found polyp. He denied any bleeding issues during the procedure or otherwise. His LDL was 81, HDL was 48. We discussed the option of using PCSK9 inhibitors for his cholesterol management given secondary prevention of CAD and goal LDL of 55-70 to be as preventive as possible.  He messaged the office 09/08/2021 complaining of "weird periodic sensations" in his chest and numbness in his bilateral arms for one week prior. No relieving or worsening factors, and his symptoms do not occur at the same time. He was concerned there may be a vascular etiology, so he was scheduled to see Dr. Gardiner Rhyme for a DOD visit the next day. At that visit he described left sided chest pressure with a burning pain, not similar to his prior MI. No improvement with nitroglycerin. An exercise Myoview was recommended to rule out ischemia, and on 09/15/2021 this revealed no evidence of ischemia or infarction. Nuclear stress EF was 58%; achieved 11.7 METS. His LDL was 81 on 05/20/2021, so 10 mg Zetia once daily was added.   At his prior appointment on 12/30/2021 he denied any chest pains but was  dealing with constant rhinorrhea throughout the day, along with epistaxis when he was congested in the mornings. He reported that it was not spontaneous and only occurred when he was trying to clear his nasal passages. He denied seasonal allergies and believed it was a side effect of one of his medications.   He was also recovering from a episode of shingles in January 2023, but was recovering well.  Today,   He reports that he is doing well overall. We discussed switching from Entresto to losartan, and discontinuing DAPT, continuing ASA 81 mg daily indefinitely.   Recently he had a follow up from a routine colonoscopy to remove a larger polyp. No issues or complications from the procedure.   He has not had his cholesterol checked at this office since 2022, but says he has not had any major shift in diet that would impact those levels. Grossly well controlled but LDL >70. Need to recheck.   He denies any palpitations, chest pain, shortness of breath, or peripheral edema. No lightheadedness, headaches, syncope, orthopnea, or PND.     Past Medical History:  Diagnosis Date   Cancer [facility])    skin cancer on left face   Coronary artery disease    GERD (gastroesophageal reflux disease)    H/O scarlet fever    Myocardial infarction [facility])    November   Pneumonia     Past Surgical History:  Procedure Laterality Date   BACK SURGERY     CARDIAC CATHETERIZATION  COLONOSCOPY WITH PROPOFOL N/A 05/31/2021   Procedure: COLONOSCOPY WITH PROPOFOL;  Surgeon: Rush Landmark Telford Nab., MD;  Location: [facility];  Service: Gastroenterology;  Laterality: N/A;   CORONARY STENT INTERVENTION  08/06/2019   CORONARY STENT INTERVENTION N/A 08/06/2019   Procedure: CORONARY STENT INTERVENTION;  Surgeon: Troy Sine, MD;  Location: [place] [facility];  Service: Cardiovascular;  Laterality: N/A;   ENDOSCOPIC MUCOSAL RESECTION N/A 05/31/2021   Procedure: ENDOSCOPIC MUCOSAL RESECTION;  Surgeon: Rush Landmark  Telford Nab., MD;  Location: [facility];  Service: Gastroenterology;  Laterality: N/A;   HEMOSTASIS CLIP PLACEMENT  05/31/2021   Procedure: HEMOSTASIS CLIP PLACEMENT;  Surgeon: Irving Copas., MD;  Location: [facility];  Service: Gastroenterology;;   INGUINAL HERNIA REPAIR Right 03/05/2020   Procedure: RIGHT OPEN INGUINAL HERNIA REPAIR WITH MESH;  Surgeon: Kinsinger, Arta Bruce, MD;  Location: [facility];  Service: General;  Laterality: Right;   LEFT HEART CATH AND CORONARY ANGIOGRAPHY N/A 08/06/2019   Procedure: LEFT HEART CATH AND CORONARY ANGIOGRAPHY;  Surgeon: Troy Sine, MD;  Location: [place] [facility];  Service: Cardiovascular;  Laterality: N/A;   POLYPECTOMY  05/31/2021   Procedure: POLYPECTOMY;  Surgeon: Rush Landmark Telford Nab., MD;  Location: [facility];  Service: Gastroenterology;;   Lia [place] LIFTING INJECTION  05/31/2021   Procedure: SUBMUCOSAL LIFTING INJECTION;  Surgeon: Irving Copas., MD;  Location: [facility];  Service: Gastroenterology;;    Current Medications: Current Meds  Medication Sig   acetaminophen (TYLENOL) 500 MG tablet Take 1,000 mg by mouth every 6 (six) hours as needed (for pain).   aspirin 81 MG chewable tablet Chew 1 tablet (81 mg total) by mouth daily.   atorvastatin (LIPITOR) 80 MG tablet Take 1 tablet (80 mg total) by mouth daily.   carvedilol (COREG) 6.25 MG tablet Take 1 tablet (6.25 mg total) by mouth 2 (two) times daily with a meal.   esomeprazole (NEXIUM) 20 MG capsule Take 20 mg by mouth daily.    ezetimibe (ZETIA) 10 MG tablet Take 1 tablet (10 mg total) by mouth daily.   fexofenadine (ALLEGRA) 180 MG tablet Take 180 mg by mouth in the morning.   hyoscyamine (LEVSIN SL) 0.125 MG SL tablet Place 0.125 mg under the tongue 3 (three) times daily as needed for cramping.   linaclotide (LINZESS) 145 MCG CAPS capsule Take 145 mcg by mouth daily as needed (constipation.).   nitroGLYCERIN (NITROSTAT) 0.4 MG SL tablet Place 1 tablet (0.4  mg total) under the tongue every 5 (five) minutes x 3 doses as needed for chest pain.   sacubitril-valsartan (ENTRESTO) 24-26 MG TAKE 1 TABLET BY MOUTH TWICE A DAY   sildenafil (VIAGRA) 100 MG tablet Take 100 mg by mouth daily as needed for erectile dysfunction.   tetrahydrozoline 0.05 % ophthalmic solution Place 1 drop into both eyes 3 (three) times daily as needed (dry/irritated eyes).   ticagrelor (BRILINTA) 60 MG TABS tablet Take 1 tablet (60 mg total) by mouth 2 (two) times daily.     Allergies:   Patient has no known allergies.   Social History   Tobacco Use   Smoking status: Never   Smokeless tobacco: Never  Vaping Use   Vaping Use: Never used  Substance Use Topics   Alcohol use: Yes    Comment: occas.   Drug use: Never     Family History: The patient's family history includes CAD in his father; Cancer in his father. There is no history of Colon cancer, Esophageal cancer, Inflammatory bowel disease,  Liver disease, Pancreatic cancer, Stomach cancer, or Rectal cancer.  ROS:   Please see the history of present illness.     All other systems reviewed and are negative.  EKGs/Labs/Other Studies Reviewed:    The following studies were reviewed today:  Exercise Stress Myoview 09/15/2021:   The study is normal. The study is low risk.   No ST deviation was noted.   LV perfusion is normal. There is no evidence of ischemia. There is no evidence of infarction.   Left ventricular function is normal. Nuclear stress EF: 58 %. The left ventricular ejection fraction is normal (55-65%). End diastolic cavity size is normal. End systolic cavity size is normal.   Prior study available for comparison from 10/10/2019.   Reduced counts in the inferior segments that improve on stress imaging and normal wall motion consistent with diaphragm attenuation.  Normal study without evidence of ischemia or infarction.  Normal LVEF, 58%.  Excellent exercise capacity (9:59 min:s; 11.7 METS).  Normal  HR/BP response to exercise.  This is a low-risk study.   Echo 07/08/2020: 1. Left ventricular ejection fraction, by estimation, is 55 to 60%. The  left ventricle has normal function. The left ventricle has no regional  wall motion abnormalities. Left ventricular diastolic parameters were  normal.   2. Right ventricular systolic function is normal. The right ventricular  size is normal.   3. The mitral valve is normal in structure. Trivial mitral valve  regurgitation. No evidence of mitral stenosis.   4. The aortic valve is tricuspid. Aortic valve regurgitation is not  visualized. No aortic stenosis is present.   5. The right lower pulmonary vein is abnormal. The inferior vena cava is  normal in size with greater than 50% respiratory variability, suggesting  right atrial pressure of 3 mmHg.    Exercise Myoview 10/10/2019: CLinically and electrically negative for ischemia Normal perfusion. No ischemia or scar LVEF 57% This is a low risk study.   EKG:  EKG is personally reviewed. 10/20/2022: Normal sinus rhythm. 12/30/2021: EKG was not ordered. 04/01/2021: Sinus rhythm. Rate 68 bpm. Septal infarct.  Recent Labs: 05/20/2021: BUN 13; Creatinine, Ser 0.83; Hemoglobin 15.5; Platelets 206.0; Potassium 4.6; Sodium 140   Recent Lipid Panel    Component Value Date/Time   CHOL 138 05/20/2021 0926   TRIG 66 05/20/2021 0926   HDL 43 05/20/2021 0926   CHOLHDL 3.2 05/20/2021 0926   CHOLHDL 4.6 08/06/2019 0407   VLDL 18 08/06/2019 0407   LDLCALC 81 05/20/2021 0926    Physical Exam:    VS:  BP 118/82 (BP Location: Left Arm, Patient Position: Sitting, Cuff Size: Normal)   Pulse 78   Ht 5' 11"$  (1.803 m)   Wt 182 lb (82.6 kg)   SpO2 99%   BMI 25.38 kg/m     Wt Readings from Last 5 Encounters:  12/30/21 182 lb (82.6 kg)  09/15/21 180 lb (81.6 kg)  09/09/21 180 lb 3.2 oz (81.7 kg)  05/21/21 174 lb (78.9 kg)  05/14/21 176 lb (79.8 kg)    Constitutional: No acute distress Eyes: sclera  non-icteric, normal conjunctiva and lids ENMT: normal dentition, moist mucous membranes Cardiovascular: regular rhythm, normal rate, no murmurs. S1 and S2 normal.  No jugular venous distention.  Respiratory: clear to auscultation bilaterally GI : normal bowel sounds, soft and nontender. No distention.   MSK: extremities warm, well perfused.  No edema.  NEURO: grossly nonfocal exam, moves all extremities. PSYCH: alert and oriented x 3, normal mood  and affect.   ASSESSMENT:    1. Coronary artery disease involving native coronary artery of native heart without angina pectoris   2. Medication management   3. Ischemic cardiomyopathy   4. Hyperlipidemia, unspecified hyperlipidemia type   5. NSTEMI (non-ST elevated myocardial infarction) [facility])     PLAN:    NSTEMI 08/06/2019 Stent in ostial LAD Ischemic cardiomyopathy with recovery of EF after revascularization and heart failure therapy Med management. -Continue aspirin 81 mg indefinitely -Discontinue the Brilinta -Switch from Entresto to losartan 25 mg daily, start tomorrow. -Repeat Echo in 6 months  Hyperlipidemia- -zetia added at appt with DOD in January. Will recheck lipids at next follow up. Continue atorvastatin 80 mg daily.   Follow-up:  6 months  Total time of encounter: 30 minutes total time of encounter, including 15 minutes spent in face-to-face patient care on the date of this encounter. This time includes coordination of care and counseling regarding above mentioned problem list. Remainder of non-face-to-face time involved reviewing chart documents/testing relevant to the patient encounter and documentation in the medical record. I have independently reviewed documentation from referring provider.   Cherlynn Kaiser, MD, [facility]   Medication Adjustments/Labs and Tests Ordered: Current medicines are reviewed at length with the patient today.  Concerns regarding medicines are outlined above.    Orders Placed This Encounter  Procedures   Lipid panel   No orders of the defined types were placed in this encounter.  Patient Instructions  Medication Instructions:  No Changes In Medications at this time.  *If you need a refill on your cardiac medications before your next appointment, please call your pharmacy*  PLEASE HAVE FASTING LABS PRIOR TO NEXT FOLLOW UP  Follow-Up: At [facility], you and your health needs are our priority.  As part of our continuing mission to provide you with exceptional heart care, we have created designated Provider Care Teams.  These Care Teams include your primary Cardiologist (physician) and Advanced Practice Providers (APPs -  Physician Assistants and Nurse Practitioners) who all work together to provide you with the care you need, when you need it.  Your next appointment:   6 month(s)  The format for your next appointment:   In Person  Provider:   Elouise Munroe, MD           Burnett Kanaris Ford,acting as a scribe for Elouise Munroe, MD.,have documented all relevant documentation on the behalf of Elouise Munroe, MD,as directed by  Elouise Munroe, MD while in the presence of Elouise Munroe, MD.   I, Elouise Munroe, MD, have reviewed all documentation for this visit. The documentation on 10/20/22 for the exam, diagnosis, procedures, and orders are all accurate and complete.

## 2022-12-09 ENCOUNTER — Other Ambulatory Visit: Payer: Self-pay | Admitting: Cardiology

## 2022-12-09 DIAGNOSIS — E785 Hyperlipidemia, unspecified: Secondary | ICD-10-CM

## 2022-12-19 ENCOUNTER — Encounter: Payer: Self-pay | Admitting: Internal Medicine

## 2022-12-19 ENCOUNTER — Other Ambulatory Visit: Payer: Self-pay | Admitting: Internal Medicine

## 2022-12-27 ENCOUNTER — Encounter: Payer: Self-pay | Admitting: Internal Medicine

## 2023-04-16 ENCOUNTER — Other Ambulatory Visit: Payer: Self-pay | Admitting: Internal Medicine

## 2023-04-19 ENCOUNTER — Encounter: Payer: Self-pay | Admitting: Internal Medicine

## 2023-04-19 MED ORDER — LOSARTAN POTASSIUM 25 MG PO TABS: 25.0000 mg | ORAL_TABLET | Freq: Every day | ORAL | 1 refills | Status: DC

## 2023-04-20 ENCOUNTER — Ambulatory Visit (HOSPITAL_COMMUNITY): Payer: 59 | Attending: Internal Medicine

## 2023-04-20 DIAGNOSIS — I2510 Atherosclerotic heart disease of native coronary artery without angina pectoris: Secondary | ICD-10-CM | POA: Diagnosis present

## 2023-04-20 LAB — ECHOCARDIOGRAM COMPLETE
Area-P 1/2: 3.46 cm2
Est EF: >75
S' Lateral: 2.1 cm

## 2023-04-25 ENCOUNTER — Ambulatory Visit: Payer: 59 | Admitting: Internal Medicine

## 2023-07-06 ENCOUNTER — Encounter: Payer: Self-pay | Admitting: Internal Medicine

## 2023-10-06 ENCOUNTER — Other Ambulatory Visit: Payer: Self-pay | Admitting: Internal Medicine

## 2023-10-10 ENCOUNTER — Encounter: Payer: Self-pay | Admitting: Internal Medicine

## 2023-10-10 MED ORDER — LOSARTAN POTASSIUM 25 MG PO TABS: 25.0000 mg | ORAL_TABLET | Freq: Every day | ORAL | 0 refills | Status: DC

## 2023-10-10 MED ORDER — CARVEDILOL 6.25 MG PO TABS: 6.2500 mg | ORAL_TABLET | Freq: Two times a day (BID) | ORAL | 0 refills | Status: DC

## 2023-10-30 NOTE — Progress Notes (Unsigned)
 Cardiology Clinic Note   Patient Name: Jeff Barnes Date of Encounter: 11/01/2023  Primary Care Provider:  Merri Brunette, MD Primary Cardiologist:  Parke Poisson, MD  Patient Profile    Jeff Barnes 53 year old male presents the clinic today for evaluation of his coronary artery disease and ischemic cardiomyopathy.  Past Medical History    Past Medical History:  Diagnosis Date   Cancer [facility])    skin cancer on left face   Coronary artery disease    GERD (gastroesophageal reflux disease)    H/O scarlet fever    Myocardial infarction [facility])    November   Pneumonia    Past Surgical History:  Procedure Laterality Date   BACK SURGERY     CARDIAC CATHETERIZATION     COLONOSCOPY WITH PROPOFOL N/A 05/31/2021   Procedure: COLONOSCOPY WITH PROPOFOL;  Surgeon: Lemar Lofty., MD;  Location: [facility];  Service: Gastroenterology;  Laterality: N/A;   COLONOSCOPY WITH PROPOFOL N/A 09/22/2022   Procedure: COLONOSCOPY WITH PROPOFOL;  Surgeon: Meridee Score Netty Starring., MD;  Location: [facility];  Service: Gastroenterology;  Laterality: N/A;   CORONARY STENT INTERVENTION  08/06/2019   CORONARY STENT INTERVENTION N/A 08/06/2019   Procedure: CORONARY STENT INTERVENTION;  Surgeon: Lennette Bihari, MD;  Location: [facility];  Service: Cardiovascular;  Laterality: N/A;   ENDOSCOPIC MUCOSAL RESECTION N/A 05/31/2021   Procedure: ENDOSCOPIC MUCOSAL RESECTION;  Surgeon: Meridee Score Netty Starring., MD;  Location: [facility];  Service: Gastroenterology;  Laterality: N/A;   HEMOSTASIS CLIP PLACEMENT  05/31/2021   Procedure: HEMOSTASIS CLIP PLACEMENT;  Surgeon: Lemar Lofty., MD;  Location: [facility];  Service: Gastroenterology;;   INGUINAL HERNIA REPAIR Right 03/05/2020   Procedure: RIGHT OPEN INGUINAL HERNIA REPAIR WITH MESH;  Surgeon: Kinsinger, De Blanch, MD;  Location: [facility];  Service: General;  Laterality: Right;   LEFT HEART CATH AND CORONARY  ANGIOGRAPHY N/A 08/06/2019   Procedure: LEFT HEART CATH AND CORONARY ANGIOGRAPHY;  Surgeon: Lennette Bihari, MD;  Location: [facility];  Service: Cardiovascular;  Laterality: N/A;   POLYPECTOMY  05/31/2021   Procedure: POLYPECTOMY;  Surgeon: Meridee Score Netty Starring., MD;  Location: [facility];  Service: Gastroenterology;;   POLYPECTOMY  09/22/2022   Procedure: POLYPECTOMY;  Surgeon: Lemar Lofty., MD;  Location: [facility];  Service: Gastroenterology;;   SUBMUCOSAL LIFTING INJECTION  05/31/2021   Procedure: SUBMUCOSAL LIFTING INJECTION;  Surgeon: Lemar Lofty., MD;  Location: [facility];  Service: Gastroenterology;;    Allergies  No Known Allergies  History of Present Illness    Jeff Barnes has a PMH of GERD HLD, NSTEMI 12/20 with reduced EF.  He was started on GDMT and his EF recovered.  He was started in cardiac rehab.  He did note 1 episode of atypical chest pain.  He underwent nuclear stress testing which was negative for ischemia.  He underwent nuclear stress test 09/15/2021 which showed no ischemia and EF of 58%.  He was able to achieve 11.7 METS of physical activity.  He continue to follow-up with cardiology regularly.  He was seen in follow-up on 12/30/2021.  During that time he denied chest discomfort.  He did note rhinorrhea along with epistaxis when he was congested in the mornings.  He also reported he was recovering from an episode of shingles 1/23.  He was last seen by Dr. Jacques Navy 10/20/2022.  During that time he was doing well from a cardiac standpoint.  Discussed transitioning from Entresto to losartan  and discontinuing dual antiplatelet therapy with continuing 81 mg of aspirin daily.  He reported that he had routine colonoscopy to remove large polyp.  He denied issues with the procedure.  He denied chest pain palpitations, lower extremity edema, syncope, orthopnea and PND.  He presents to the clinic today for follow-up evaluation and states he  is fairly physically active doing calisthenics and walking daily.  On the weekends he enjoys hiking and doing more aerobic type activities.  We reviewed his EKG and previous angiography.  He expressed understanding.  His echocardiogram shows recovered EF.  His last lipid panel showed an LDL cholesterol of 69.  I encouraged him to increase the fiber in his diet, increase his physical activity as tolerated, and we will repeat his fasting lipids and LFTs in 6 to 8 weeks.  We reviewed goal LDL cholesterol of 55.  We also reviewed option for PCSK9 inhibitor.  I will plan follow-up in 12 months.  Today he denies chest pain, shortness of breath, lower extremity edema, fatigue, palpitations, melena, hematuria, hemoptysis, diaphoresis, weakness, presyncope, syncope, orthopnea, and PND.   Home Medications    Prior to Admission medications   Medication Sig Start Date End Date Taking? Authorizing Provider  acetaminophen (TYLENOL) 500 MG tablet Take 1,000-1,500 mg by mouth 2 (two) times daily as needed for moderate pain.    [provider]  aspirin 81 MG chewable tablet Chew 1 tablet (81 mg total) by mouth daily. 08/09/19   Parke Poisson, MD  atorvastatin (LIPITOR) 80 MG tablet TAKE 1 TABLET BY MOUTH EVERY DAY 12/19/22   Parke Poisson, MD  carvedilol (COREG) 6.25 MG tablet Take 1 tablet (6.25 mg total) by mouth 2 (two) times daily with a meal. 10/10/23   Parke Poisson, MD  esomeprazole (NEXIUM) 20 MG capsule Take 20 mg by mouth daily.     [provider]  ezetimibe (ZETIA) 10 MG tablet TAKE 1 TABLET BY MOUTH EVERY DAY 12/09/22   Parke Poisson, MD  fexofenadine (ALLEGRA) 180 MG tablet Take 180 mg by mouth in the morning.    [provider]  fluticasone (FLONASE) 50 MCG/ACT nasal spray Place 2 sprays into both nostrils daily as needed for allergies or rhinitis.    [provider]  hyoscyamine (LEVSIN SL) 0.125 MG SL tablet Place 0.125 mg under the tongue 3 (three)  times daily as needed for cramping. 04/05/21   [provider]  losartan (COZAAR) 25 MG tablet Take 1 tablet (25 mg total) by mouth daily. 10/10/23   Parke Poisson, MD  Melatonin 10 MG TABS Take 70-80 mg by mouth at bedtime.    [provider]  Menthol, Topical Analgesic, (BIOFREEZE EX) Apply 1 Application topically daily as needed (back pain).    [provider]  Menthol-Methyl Salicylate (SALONPAS PAIN RELIEF PATCH EX) Apply 1-2 patches topically daily as needed (back pain).    [provider]  nitroGLYCERIN (NITROSTAT) 0.4 MG SL tablet Place 1 tablet (0.4 mg total) under the tongue every 5 (five) minutes x 3 doses as needed for chest pain. 09/09/21   Little Ishikawa, MD  sildenafil (VIAGRA) 100 MG tablet Take 100 mg by mouth daily as needed for erectile dysfunction. 03/09/21   [provider]    Family History    Family History  Problem Relation Age of Onset   Cancer Father    CAD Father    Colon cancer Neg Hx    Esophageal cancer Neg  Hx    Inflammatory bowel disease Neg Hx    Liver disease Neg Hx    Pancreatic cancer Neg Hx    Stomach cancer Neg Hx    Rectal cancer Neg Hx    He indicated that his mother is alive. He indicated that his father is deceased. He indicated that the status of his neg hx is unknown.  Social History    Social History   Socioeconomic History   Marital status: Married    Spouse name: Not on file   Number of children: 2   Years of education: 16   Highest education level: Bachelor's degree (e.g., BA, AB, BS)  Occupational History   Not on file  Tobacco Use   Smoking status: Never   Smokeless tobacco: Never  Vaping Use   Vaping status: Never Used  Substance and Sexual Activity   Alcohol use: Yes    Comment: occas.   Drug use: Never   Sexual activity: Yes  Other Topics Concern   Not on file  Social History Narrative   Not on file   Social Drivers of Health   Financial Resource Strain: Not on  file  Food Insecurity: Not on file  Transportation Needs: Not on file  Physical Activity: Not on file  Stress: Not on file  Social Connections: Not on file  Intimate Partner Violence: Not on file     Review of Systems    General:  No chills, fever, night sweats or weight changes.  Cardiovascular:  No chest pain, dyspnea on exertion, edema, orthopnea, palpitations, paroxysmal nocturnal dyspnea. Dermatological: No rash, lesions/masses Respiratory: No cough, dyspnea Urologic: No hematuria, dysuria Abdominal:   No nausea, vomiting, diarrhea, bright red blood per rectum, melena, or hematemesis Neurologic:  No visual changes, wkns, changes in mental status. All other systems reviewed and are otherwise negative except as noted above.  Physical Exam    VS:  BP 122/76 (BP Location: Left Arm, Patient Position: Sitting, Cuff Size: Normal)   Pulse 77   Ht 5\' 10"  (1.778 m)   Wt 192 lb 9.6 oz (87.4 kg)   BMI 27.64 kg/m  , BMI Body mass index is 27.64 kg/m. GEN: Well nourished, well developed, in no acute distress. HEENT: normal. Neck: Supple, no JVD, carotid bruits, or masses. Cardiac: RRR, no murmurs, rubs, or gallops. No clubbing, cyanosis, edema.  Radials/DP/PT 2+ and equal bilaterally.  Respiratory:  Respirations regular and unlabored, clear to auscultation bilaterally. GI: Soft, nontender, nondistended, BS + x 4. MS: no deformity or atrophy. Skin: warm and dry, no rash. Neuro:  Strength and sensation are intact. Psych: Normal affect.  Accessory Clinical Findings    Recent Labs: No results found for requested labs within last 365 days.   Recent Lipid Panel    Component Value Date/Time   CHOL 138 05/20/2021 0926   TRIG 66 05/20/2021 0926   HDL 43 05/20/2021 0926   CHOLHDL 3.2 05/20/2021 0926   CHOLHDL 4.6 08/06/2019 0407   VLDL 18 08/06/2019 0407   LDLCALC 81 05/20/2021 0926         ECG personally reviewed by me today- EKG Interpretation Date/Time:  Wednesday  November 01 2023 08:04:11 EST Ventricular Rate:  77 PR Interval:  178 QRS Duration:  80 QT Interval:  352 QTC Calculation: 398 R Axis:   79  Text Interpretation: Normal sinus rhythm Normal ECG Confirmed by Edd Fabian [phone]) on 11/01/2023 8:05:52 AM    echocardiogram 04/20/2023  IMPRESSIONS  1. Left ventricular ejection fraction, by estimation, is >75%. The left  ventricle has hyperdynamic function. The left ventricle has no regional  wall motion abnormalities. Left ventricular diastolic parameters were  normal.   2. Right ventricular systolic function is normal. The right ventricular  size is normal.   3. The mitral valve is normal in structure. Trivial mitral valve  regurgitation.   4. The aortic valve is tricuspid. Aortic valve regurgitation is not  visualized.   5. The inferior vena cava is normal in size with greater than 50%  respiratory variability, suggesting right atrial pressure of 3 mmHg.   FINDINGS   Left Ventricle: Left ventricular ejection fraction, by estimation, is  >75%. The left ventricle has hyperdynamic function. The left ventricle has  no regional wall motion abnormalities. The left ventricular internal  cavity size was normal in size. There  is no left ventricular hypertrophy. Left ventricular diastolic parameters  were normal.   Right Ventricle: The right ventricular size is normal. Right vetricular  wall thickness was not assessed. Right ventricular systolic function is  normal.   Left Atrium: Left atrial size was normal in size.   Right Atrium: Right atrial size was normal in size.   Pericardium: There is no evidence of pericardial effusion.   Mitral Valve: The mitral valve is normal in structure. Trivial mitral  valve regurgitation.   Tricuspid Valve: The tricuspid valve is normal in structure. Tricuspid  valve regurgitation is trivial.   Aortic Valve: The aortic valve is tricuspid. Aortic valve regurgitation is  not visualized.    Pulmonic Valve: The pulmonic valve was normal in structure. Pulmonic valve  regurgitation is not visualized.   Aorta: The aortic root and ascending aorta are structurally normal, with  no evidence of dilitation.   Venous: The inferior vena cava is normal in size with greater than 50%  respiratory variability, suggesting right atrial pressure of 3 mmHg.   IAS/Shunts: No atrial level shunt detected by color flow Doppler.    Exercise stress test 09/15/2021    The study is normal. The study is low risk.   No ST deviation was noted.   LV perfusion is normal. There is no evidence of ischemia. There is no evidence of infarction.   Left ventricular function is normal. Nuclear stress EF: 58 %. The left ventricular ejection fraction is normal (55-65%). End diastolic cavity size is normal. End systolic cavity size is normal.   Prior study available for comparison from 10/10/2019.   Reduced counts in the inferior segments that improve on stress imaging and normal wall motion consistent with diaphragm attenuation.  Normal study without evidence of ischemia or infarction.  Normal LVEF, 58%.  Excellent exercise capacity (9:59 min:s; 11.7 METS).  Normal HR/BP response to exercise.  This is a low-risk study.    Echo 07/08/2020: 1. Left ventricular ejection fraction, by estimation, is 55 to 60%. The  left ventricle has normal function. The left ventricle has no regional  wall motion abnormalities. Left ventricular diastolic parameters were  normal.   2. Right ventricular systolic function is normal. The right ventricular  size is normal.   3. The mitral valve is normal in structure. Trivial mitral valve  regurgitation. No evidence of mitral stenosis.   4. The aortic valve is tricuspid. Aortic valve regurgitation is not  visualized. No aortic stenosis is present.   5. The right lower pulmonary vein is abnormal. The inferior vena cava is  normal in size with greater than 50% respiratory  variability, suggesting  right atrial pressure of 3 mmHg.      Exercise Myoview 10/10/2019: CLinically and electrically negative for ischemia Normal perfusion. No ischemia or scar LVEF 57% This is a low risk study.      Assessment & Plan   1.  Coronary artery disease-denies recent anginal type symptoms.  NSTEMI 12/20.  He received PCI with stenting to his ostial LAD.  He was noted to have ischemic cardiomyopathy and his EF recovered.  Echocardiogram 04/20/2023 showed LVEF greater than 75%, trivial mitral valve regurgitation and no other significant valvular abnormalities. Continue aspirin, losartan Heart healthy low-sodium diet Maintain physical activity  Hyperlipidemia-LDL 69 on 03/22/23. High-fiber diet- food list given, may use fiber supplement Increase physical activity as tolerated Continue atorvastatin, ezetimibe Repeat fasting lipids and LFTs-6 to 8 weeks   Disposition: Follow-up with Dr. Jacques Navy or me in 12 months.   Thomasene Ripple. Connor Meacham NP-C     11/01/2023, 8:26 AM [facility] [address] Office [phone] Fax [phone]    I spent 14 minutes examining this patient, reviewing medications, and using patient centered shared decision making involving their cardiac care.   I spent  20 minutes reviewing past medical history,  medications, and prior cardiac tests.

## 2023-11-01 ENCOUNTER — Encounter: Payer: Self-pay | Admitting: General Practice

## 2023-11-01 ENCOUNTER — Ambulatory Visit: Payer: 59 | Attending: General Practice | Admitting: General Practice

## 2023-11-01 VITALS — BP 122/76 | HR 77 | Ht 70.0 in | Wt 192.6 lb

## 2023-11-01 DIAGNOSIS — I2510 Atherosclerotic heart disease of native coronary artery without angina pectoris: Secondary | ICD-10-CM | POA: Diagnosis not present

## 2023-11-01 DIAGNOSIS — E785 Hyperlipidemia, unspecified: Secondary | ICD-10-CM | POA: Diagnosis not present

## 2023-11-01 MED ORDER — LOSARTAN POTASSIUM 25 MG PO TABS
25.0000 mg | ORAL_TABLET | Freq: Every day | ORAL | 3 refills | Status: AC
  Filled 2024-09-30: qty 30, 30d supply, fill #0

## 2023-11-01 MED ORDER — EZETIMIBE 10 MG PO TABS: 10.0000 mg | ORAL_TABLET | Freq: Every day | ORAL | 3 refills | Status: AC

## 2023-11-01 MED ORDER — NITROGLYCERIN 0.4 MG SL SUBL: 0.4000 mg | SUBLINGUAL_TABLET | SUBLINGUAL | 1 refills | Status: AC | PRN

## 2023-11-01 MED ORDER — ATORVASTATIN CALCIUM 80 MG PO TABS: 80.0000 mg | ORAL_TABLET | Freq: Every day | ORAL | 3 refills | Status: AC

## 2023-11-01 MED ORDER — ASPIRIN 81 MG PO CHEW: 81.0000 mg | CHEWABLE_TABLET | Freq: Every day | ORAL | 3 refills | Status: DC

## 2023-11-01 MED ORDER — CARVEDILOL 6.25 MG PO TABS: 6.2500 mg | ORAL_TABLET | Freq: Two times a day (BID) | ORAL | 3 refills | Status: AC

## 2023-11-01 NOTE — Patient Instructions (Signed)
 Medication Instructions:  The current medical regimen is effective;  continue present plan and medications as directed. Please refer to the Current Medication list given to you today.  *If you need a refill on your cardiac medications before your next appointment, please call your pharmacy*  Lab Work: FASTING LIPID AND LFT IN 6-8 WEEKS If you have labs (blood work) drawn today and your tests are completely normal, you will receive your results only by:  MyChart Message (if you have MyChart) OR A paper copy in the mail If you have any lab test that is abnormal or we need to change your treatment, we will call you to review the results.  Other Instructions PLEASE READ AND FOLLOW ATTACHED INCREASED FIBER DIET-MAY [place] METAMUCIL OR BENEFIBER, ETC.. INCREASE PHYSICAL ACTIVITY AS TOLERATED  Follow-Up: At [facility], you and your health needs are our priority.  As part of our continuing mission to provide you with exceptional heart care, we have created designated Provider Care Teams.  These Care Teams include your primary Cardiologist (physician) and Advanced Practice Providers (APPs -  Physician Assistants and Nurse Practitioners) who all work together to provide you with the care you need, when you need it.  Your next appointment:   12 month(s)  Provider:   Parke Poisson, MD         High-Fiber Eating Plan Fiber, also called dietary fiber, is found in foods such as fruits, vegetables, whole grains, and beans. A high-fiber diet can be good for your health. Your health care provider may recommend a high-fiber diet to help: Prevent trouble pooping (constipation). Lower your cholesterol. Treat the following conditions: Hemorrhoids. This is inflammation of veins in the anus. Inflammation of specific areas of the digestive tract. Irritable bowel syndrome (IBS). This is a problem of the large intestine, also called the colon, that sometimes causes belly pain and bloating. Prevent  overeating as part of a weight-loss plan. Lower the risk of heart disease, type 2 diabetes, and certain cancers. What are tips for following this plan? Reading food labels  Check the nutrition facts label on foods for the amount of dietary fiber. Choose foods that have 4 grams of fiber or more per serving. The recommended goals for how much fiber you should eat each day include: Males 10 years old or younger: 30-34 g. Males over 48 years old: 28-34 g. Females 44 years old or younger: 25-28 g. Females over 56 years old: 22-25 g. Your daily fiber goal is _____________ g. Shopping Choose whole fruits and vegetables instead of processed. For example, choose apples instead of apple juice or applesauce. Choose a variety of high-fiber foods such as avocados, lentils, oats, and pinto beans. Read the nutrition facts label on foods. Check for foods with added fiber. These foods often have high sugar and salt (sodium) amounts per serving. Cooking Use whole-grain flour for baking and cooking. Cook with brown rice instead of white rice. Make meals that have a lot of beans and vegetables in them, such as chili or vegetable-based soups. Meal planning Start the day with a breakfast that is high in fiber, such as a cereal that has 5 g of fiber or more per serving. Eat breads and cereals that are made with whole-grain flour instead of refined flour or white flour. Eat brown rice, bulgur wheat, or millet instead of white rice. Use beans in place of meat in soups, salads, and pasta dishes. Be sure that half of the grains you eat each day  are whole grains. General information You can get the recommended amount of dietary fiber by: Eating a variety of fruits, vegetables, grains, nuts, and beans. Taking a fiber supplement if you aren't able to eat enough fiber. It's better to get fiber through food than from a supplement. Slowly increase how much fiber you eat. If you increase the amount of fiber you eat too  quickly, you may have bloating, cramping, or gas. Drink plenty of water to help you digest fiber. Choose high-fiber snacks, such as berries, raw vegetables, nuts, and popcorn. What foods should I eat? Fruits Berries. Pears. Apples. Oranges. Avocado. Prunes and raisins. Dried figs. Vegetables Sweet potatoes. Spinach. Kale. Artichokes. Cabbage. Broccoli. Cauliflower. Green peas. Carrots. Squash. Grains Whole-grain breads. Multigrain cereal. Oats and oatmeal. Brown rice. Barley. Bulgur wheat. Millet. Quinoa. Bran muffins. Popcorn. Rye wafer crackers. Meats and other proteins Navy beans, kidney beans, and pinto beans. Soybeans. Split peas. Lentils. Nuts and seeds. Dairy Fiber-fortified yogurt. Fortified means that fiber has been added to the product. Beverages Fiber-fortified soy milk. Fiber-fortified orange juice. Other foods Fiber bars. The items listed above may not be all the foods and drinks you can have. Talk to a dietitian to learn more. What foods should I avoid? Fruits Fruit juice. Cooked, strained fruit. Vegetables Fried potatoes. Canned vegetables. Well-cooked vegetables. Grains White bread. Pasta made with refined flour. White rice. Meats and other proteins Fatty meat. Fried chicken or fried fish. Dairy Milk. Cream cheese. Sour cream. Fats and oils Butters. Beverages Soft drinks. Other foods Cakes and pastries. The items listed above may not be all the foods and drinks you should avoid. Talk to a dietitian to learn more. This information is not intended to replace advice given to you by your health care provider. Make sure you discuss any questions you have with your health care provider. Document Revised: 11/14/2022 Document Reviewed: 11/14/2022 Elsevier Patient Education  2024 [employer].

## 2024-04-03 ENCOUNTER — Encounter: Payer: Self-pay | Admitting: Internal Medicine

## 2024-09-05 ENCOUNTER — Emergency Department (HOSPITAL_COMMUNITY)

## 2024-09-05 ENCOUNTER — Emergency Department (HOSPITAL_COMMUNITY)
Admission: EM | Admit: 2024-09-05 | Discharge: 2024-09-05 | Disposition: A | Discharge status: Home / Self Care | Attending: Emergency Medicine | Admitting: Emergency Medicine

## 2024-09-05 ENCOUNTER — Other Ambulatory Visit: Payer: Self-pay

## 2024-09-05 ENCOUNTER — Encounter (HOSPITAL_COMMUNITY): Payer: Self-pay | Admitting: *Deleted

## 2024-09-05 DIAGNOSIS — Z955 Presence of coronary angioplasty implant and graft: Secondary | ICD-10-CM | POA: Diagnosis not present

## 2024-09-05 DIAGNOSIS — M79605 Pain in left leg: Secondary | ICD-10-CM | POA: Insufficient documentation

## 2024-09-05 DIAGNOSIS — S022XXA Fracture of nasal bones, initial encounter for closed fracture: Secondary | ICD-10-CM | POA: Diagnosis not present

## 2024-09-05 DIAGNOSIS — W108XXA Fall (on) (from) other stairs and steps, initial encounter: Secondary | ICD-10-CM | POA: Diagnosis not present

## 2024-09-05 DIAGNOSIS — W19XXXA Unspecified fall, initial encounter: Secondary | ICD-10-CM

## 2024-09-05 DIAGNOSIS — S99912A Unspecified injury of left ankle, initial encounter: Secondary | ICD-10-CM | POA: Diagnosis present

## 2024-09-05 DIAGNOSIS — S82842A Displaced bimalleolar fracture of left lower leg, initial encounter for closed fracture: Secondary | ICD-10-CM | POA: Diagnosis not present

## 2024-09-05 DIAGNOSIS — Z7982 Long term (current) use of aspirin: Secondary | ICD-10-CM | POA: Insufficient documentation

## 2024-09-05 DIAGNOSIS — Y92009 Unspecified place in unspecified non-institutional (private) residence as the place of occurrence of the external cause: Secondary | ICD-10-CM | POA: Insufficient documentation

## 2024-09-05 DIAGNOSIS — Z85828 Personal history of other malignant neoplasm of skin: Secondary | ICD-10-CM | POA: Insufficient documentation

## 2024-09-05 DIAGNOSIS — I2510 Atherosclerotic heart disease of native coronary artery without angina pectoris: Secondary | ICD-10-CM | POA: Insufficient documentation

## 2024-09-05 DIAGNOSIS — S0990XA Unspecified injury of head, initial encounter: Secondary | ICD-10-CM | POA: Diagnosis not present

## 2024-09-05 LAB — CBC
HCT: 45 % (ref 39.0–52.0)
Hemoglobin: 15 g/dL (ref 13.0–17.0)
MCH: 30.7 pg (ref 26.0–34.0)
MCHC: 33.3 g/dL (ref 30.0–36.0)
MCV: 92.2 fL (ref 80.0–100.0)
Platelets: 205 K/uL (ref 150–400)
RBC: 4.88 MIL/uL (ref 4.22–5.81)
RDW: 13.8 % (ref 11.5–15.5)
WBC: 9.2 K/uL (ref 4.0–10.5)
nRBC: 0 % (ref 0.0–0.2)

## 2024-09-05 LAB — COMPREHENSIVE METABOLIC PANEL WITH GFR
ALT: 27 U/L (ref 0–44)
AST: 27 U/L (ref 15–41)
Albumin: 4.5 g/dL (ref 3.5–5.0)
Alkaline Phosphatase: 69 U/L (ref 38–126)
Anion gap: 10 (ref 5–15)
BUN: 13 mg/dL (ref 6–20)
CO2: 24 mmol/L (ref 22–32)
Calcium: 9.3 mg/dL (ref 8.9–10.3)
Chloride: 102 mmol/L (ref 98–111)
Creatinine, Ser: 0.85 mg/dL (ref 0.61–1.24)
GFR, Estimated: >60 mL/min
Glucose, Bld: 103 mg/dL — ABNORMAL HIGH (ref 70–99)
Potassium: 4.4 mmol/L (ref 3.5–5.1)
Sodium: 137 mmol/L (ref 135–145)
Total Bilirubin: 0.5 mg/dL (ref 0.0–1.2)
Total Protein: 7.6 g/dL (ref 6.5–8.1)

## 2024-09-05 LAB — I-STAT CHEM 8, ED
BUN: 15 mg/dL (ref 6–20)
Calcium, Ion: 1.15 mmol/L (ref 1.15–1.40)
Chloride: 102 mmol/L (ref 98–111)
Creatinine, Ser: 0.9 mg/dL (ref 0.61–1.24)
Glucose, Bld: 104 mg/dL — ABNORMAL HIGH (ref 70–99)
HCT: 45 % (ref 39.0–52.0)
Hemoglobin: 15.3 g/dL (ref 13.0–17.0)
Potassium: 4.3 mmol/L (ref 3.5–5.1)
Sodium: 138 mmol/L (ref 135–145)
TCO2: 25 mmol/L (ref 22–32)

## 2024-09-05 MED ORDER — ONDANSETRON HCL 4 MG/2ML IJ SOLN
4.0000 mg | Freq: Once | INTRAMUSCULAR | Status: AC
  Administered 2024-09-05: 4 mg via INTRAVENOUS
  Filled 2024-09-05: qty 2

## 2024-09-05 MED ORDER — MORPHINE SULFATE (PF) 4 MG/ML IV SOLN
4.0000 mg | Freq: Once | INTRAVENOUS | Status: AC
  Administered 2024-09-05: 4 mg via INTRAVENOUS
  Filled 2024-09-05: qty 1

## 2024-09-05 MED ORDER — FENTANYL CITRATE (PF) 50 MCG/ML IJ SOSY
100.0000 ug | PREFILLED_SYRINGE | INTRAMUSCULAR | Status: AC
  Administered 2024-09-05: 100 ug via INTRAVENOUS
  Filled 2024-09-05: qty 2

## 2024-09-05 MED ORDER — OXYCODONE-ACETAMINOPHEN 5-325 MG PO TABS: 1.0000 | ORAL_TABLET | Freq: Four times a day (QID) | ORAL | 0 refills | Status: DC | PRN

## 2024-09-05 MED ORDER — PROPOFOL 10 MG/ML IV BOLUS
0.5000 mg/kg | Freq: Once | INTRAVENOUS | Status: AC
  Administered 2024-09-05: 43.7 mg via INTRAVENOUS
  Filled 2024-09-05: qty 20

## 2024-09-05 MED ORDER — OXYCODONE-ACETAMINOPHEN 5-325 MG PO TABS
2.0000 | ORAL_TABLET | Freq: Once | ORAL | Status: AC
  Administered 2024-09-05: 2 via ORAL
  Filled 2024-09-05: qty 2

## 2024-09-05 MED ORDER — LACTATED RINGERS IV BOLUS
1000.0000 mL | Freq: Once | INTRAVENOUS | Status: AC
  Administered 2024-09-05: 1000 mL via INTRAVENOUS

## 2024-09-05 MED ORDER — OXYCODONE-ACETAMINOPHEN 5-325 MG PO TABS: 1.0000 | ORAL_TABLET | Freq: Once | ORAL | Status: DC

## 2024-09-05 NOTE — ED Provider Notes (Signed)
 Patient signed out to myself at shift change.  Patient was noted to have fallen down 4 feet of stairs and had a  bimalleolar left ankle fracture with dislocation which was reduced.  Orthopedics has been consulted and will see the patient at the bedside.  Signout pending orthopedic evaluation.  Patient does have nasal bone fractures noted on CT scan of the maxillofacial region as well. BP (!) 141/88   Pulse 93   Temp 97.8 F (36.6 C) (Oral)   Resp (!) 22   Ht 5' 10 (1.778 m)   Wt 87.4 kg   SpO2 98%   BMI 27.65 kg/m   Physical Exam  Procedures  Procedures  ED Course / MDM   Clinical Course as of 09/05/24 1515  Thu Sep 05, 2024  1326 IMPRESSION: 1. Ankle dislocation with posterior and lateral displacement of the talus relative to the tibial plafond and widening of the medial clear space, without definite talar fracture identified. 2. Acute displaced transsyndesmotic distal fibular fracture. 3. Possible avulsion fracture of the medial malleolus and possible posterior malleolar fracture. 4. Ankle effusion and subcutaneous soft tissue edema.   [WF]  1447 Dr. Reyne will evaluate pt at bedside (ortho) to consider surgery today.  [WF]    Clinical Course User Index [WF] Neldon Hamp RAMAN, PA   Medical Decision Making Amount and/or Complexity of Data Reviewed Labs: ordered. Radiology: ordered.  Risk Prescription drug management.   Patient was evaluated by orthopedics who did initially request for admission and surgery for tomorrow.  They did call back and recommended discharge home with close outpatient follow-up with Dr. Celena.  Patient does have controlled pain at this time.  Splint is in place and he was directed to leave the splint in place until evaluated on an outpatient basis.  Crutches were provided.  Further pain control was called in as well.  Strict turn precautions were discussed for any new or worsening symptoms.  Patient voiced understanding and had no additional  questions.  He was also made aware of the nasal bone fracture and was provided for resources for follow-up with maxillofacial trauma.       Daralene Lonni BIRCH, PA-C 09/05/24 1810    Patt Alm Macho, MD 09/05/24 [phone]

## 2024-09-05 NOTE — ED Notes (Signed)
Ortho called for crutches 

## 2024-09-05 NOTE — Consult Note (Signed)
 Orthopedic Consult  Patient ID: Jeff Barnes MRN: [id number] DOB/AGE: October 18, 1970 54 y.o.  Reason for Consult: Left ankle pain  Referring Physician: patt  HPI: Jeff Barnes is an 54 y.o. male who was moving furniture earlier today on the stairs when he tripped and fell.  He injured his left ankle.  He reports striking his face but no loss of consciousness.  Denies any other injuries or result of the fall.  He denies any antecedent left ankle pain  Past Medical History:  Diagnosis Date   Cancer (HCC)    skin cancer on left face   Coronary artery disease    GERD (gastroesophageal reflux disease)    H/O scarlet fever    Myocardial infarction [facility])    November   Pneumonia     Past Surgical History:  Procedure Laterality Date   BACK SURGERY     CARDIAC CATHETERIZATION     COLONOSCOPY WITH PROPOFOL  N/A 05/31/2021   Procedure: COLONOSCOPY WITH PROPOFOL ;  Surgeon: Wilhelmenia Aloha Raddle., MD;  Location: [facility];  Service: Gastroenterology;  Laterality: N/A;   COLONOSCOPY WITH PROPOFOL  N/A 09/22/2022   Procedure: COLONOSCOPY WITH PROPOFOL ;  Surgeon: Wilhelmenia Aloha Raddle., MD;  Location: [facility];  Service: Gastroenterology;  Laterality: N/A;   CORONARY STENT INTERVENTION  08/06/2019   CORONARY STENT INTERVENTION N/A 08/06/2019   Procedure: CORONARY STENT INTERVENTION;  Surgeon: Burnard Debby DELENA, MD;  Location: [facility];  Service: Cardiovascular;  Laterality: N/A;   ENDOSCOPIC MUCOSAL RESECTION N/A 05/31/2021   Procedure: ENDOSCOPIC MUCOSAL RESECTION;  Surgeon: Wilhelmenia Aloha Raddle., MD;  Location: [facility];  Service: Gastroenterology;  Laterality: N/A;   HEMOSTASIS CLIP PLACEMENT  05/31/2021   Procedure: HEMOSTASIS CLIP PLACEMENT;  Surgeon: Wilhelmenia Aloha Raddle., MD;  Location: [facility];  Service: Gastroenterology;;   INGUINAL HERNIA REPAIR Right 03/05/2020   Procedure: RIGHT OPEN INGUINAL HERNIA REPAIR WITH MESH;  Surgeon: Kinsinger, Herlene Righter, MD;   Location: [facility];  Service: General;  Laterality: Right;   LEFT HEART CATH AND CORONARY ANGIOGRAPHY N/A 08/06/2019   Procedure: LEFT HEART CATH AND CORONARY ANGIOGRAPHY;  Surgeon: Burnard Debby DELENA, MD;  Location: [facility];  Service: Cardiovascular;  Laterality: N/A;   POLYPECTOMY  05/31/2021   Procedure: POLYPECTOMY;  Surgeon: Wilhelmenia Aloha Raddle., MD;  Location: [facility];  Service: Gastroenterology;;   POLYPECTOMY  09/22/2022   Procedure: POLYPECTOMY;  Surgeon: Wilhelmenia Aloha Raddle., MD;  Location: [facility];  Service: Gastroenterology;;   SUBMUCOSAL LIFTING INJECTION  05/31/2021   Procedure: SUBMUCOSAL LIFTING INJECTION;  Surgeon: Wilhelmenia Aloha Raddle., MD;  Location: [facility];  Service: Gastroenterology;;    Family History  Problem Relation Age of Onset   Cancer Father    CAD Father    Colon cancer Neg Hx    Esophageal cancer Neg Hx    Inflammatory bowel disease Neg Hx    Liver disease Neg Hx    Pancreatic cancer Neg Hx    Stomach cancer Neg Hx    Rectal cancer Neg Hx     Social History:  reports that he has never smoked. He has never used smokeless tobacco. He reports current alcohol use. He reports that he does not use drugs.  Allergies: Allergies[1]  Medications: I have reviewed the patient's current medications.    Exam: Blood pressure 128/83, pulse 96, temperature 97.8 F (36.6 C), temperature source Oral, resp. rate 13, height 5' 10 (1.778 m), weight 87.4 kg, SpO2 100%. General: Well-appearing gentleman no acute distress Orientation:  Alert and oriented Mood and Affect: Mood is calm   Injured Extremity (CV, lymph, sensation, reflexes): Left leg is positioned in a splint.  He has no tenderness about the knee.  No pain with range of motion of left hip.  He has mildly diminished but overall intact sensation in the tibial and peroneal nerve distributions.  3 out of 5 strength EHL and FHL.  No pain with passive stretch.  Examination of the  bilateral upper extremities and the right lower extremity reveals no tense outpatient crepitus defects or deformities  Medical Decision Making: Data: Imaging: X-rays show displaced left trimalar ankle fracture.  Postreduction films reveal acceptable position reduction of the fracture  Labs: Recent laboratory studies are unremarkable  Imaging or Labs ordered: CT scan left ankle  Medical history and chart was reviewed and case discussed with medical provider.  Assessment/Plan: The patient does have a left trimalar ankle fracture.  This has been successfully closed reduced.  However will still require surgical treatment.  This to be an open reduction and internal fixation.  We discussed the goals of surgery to stabilize the fracture, stabilize the joint, promote bone healing.  We discussed the risks include but are not limited to bleeding, infection, injury to nerves, injury to tendons, nonunion, malunion, hardware failure, posttraumatic arthritis, the risk of blood clots, and the need for additional surgeries.  Informed consent was obtained.  Will plan to admit the patient overnight.  Will keep the leg elevated.  Will make sure his pain is controlled.  Will plan for surgery tomorrow hopefully in the morning.   New problem w/ workup planned: High complexity diagnosis (Level 5) Surgery w/ risks or Emergency surgery: High complexity Risk (Level 5)  All others are Level 4 with comprehensive musculoskeletal exam.  Cordella Rhein, MD, MS [facility]oped[facility] Specialist / [place] [phone]      [1] No Known Allergies

## 2024-09-05 NOTE — ED Provider Notes (Signed)
 " [place] EMERGENCY DEPARTMENT AT [facility] Provider Note   CSN: [id number] Arrival date & time: 09/05/24  1151     Patient presents with: Jeff Barnes Jeff Barnes is a 54 y.o. male.    Fall   Patient is a 54 year old male with past medical history significant for CAD, pneumonia, reflux, cancer  Patient presents emergency room today with complaints of left ankle pain after a fall that occurred prior to arrival in the ER.  Seems that he slipped while carrying a chair up the stairs and fell backwards sliding down 4 feet worth of stairs.  He complains of left lower extremity pain specifically in the ankle and lower leg.  He denies any knee or hip pain  No headache nausea or vomiting.  No chest pain or difficulty breathing.     Prior to Admission medications  Medication Sig Start Date End Date Taking? Authorizing Provider  acetaminophen  (TYLENOL ) 500 MG tablet Take 1,000-1,500 mg by mouth 2 (two) times daily as needed for moderate pain.    [provider]  aspirin  81 MG chewable tablet Chew 1 tablet (81 mg total) by mouth daily. 11/01/23   Emelia Josefa HERO, NP  atorvastatin  (LIPITOR ) 80 MG tablet Take 1 tablet (80 mg total) by mouth daily. 11/01/23   Emelia Josefa HERO, NP  carvedilol  (COREG ) 6.25 MG tablet Take 1 tablet (6.25 mg total) by mouth 2 (two) times daily with a meal. 11/01/23   Cleaver, Josefa HERO, NP  esomeprazole (NEXIUM) 20 MG capsule Take 20 mg by mouth daily.     [provider]  ezetimibe  (ZETIA ) 10 MG tablet Take 1 tablet (10 mg total) by mouth daily. 11/01/23   Emelia Josefa HERO, NP  fexofenadine (ALLEGRA) 180 MG tablet Take 180 mg by mouth in the morning.    [provider]  fluticasone  (FLONASE ) 50 MCG/ACT nasal spray Place 2 sprays into both nostrils daily as needed for allergies or rhinitis.    [provider]  hyoscyamine (LEVSIN SL) 0.125 MG SL tablet Place 0.125 mg under the tongue 3 (three) times daily as needed for  cramping. 04/05/21   [provider]  losartan  (COZAAR ) 25 MG tablet Take 1 tablet (25 mg total) by mouth daily. 11/01/23   Emelia Josefa HERO, NP  Melatonin 10 MG TABS Take 70-80 mg by mouth at bedtime.    [provider]  Menthol, Topical Analgesic, (BIOFREEZE EX) Apply 1 Application topically daily as needed (back pain).    [provider]  Menthol-Methyl Salicylate (SALONPAS PAIN RELIEF PATCH EX) Apply 1-2 patches topically daily as needed (back pain).    [provider]  nitroGLYCERIN  (NITROSTAT ) 0.4 MG SL tablet Place 1 tablet (0.4 mg total) under the tongue every 5 (five) minutes x 3 doses as needed for chest pain. 11/01/23   Emelia Josefa HERO, NP  sildenafil (VIAGRA) 100 MG tablet Take 100 mg by mouth daily as needed for erectile dysfunction. 03/09/21   [provider]    Allergies: Patient has no known allergies.    Review of Systems  Updated Vital Signs BP (!) 141/88   Pulse 93   Temp 97.8 F (36.6 C) (Oral)   Resp (!) 22   Ht 5' 10 (1.778 m)   Wt 87.4 kg   SpO2 98%   BMI 27.65 kg/m   Physical Exam Vitals and nursing note reviewed.  Constitutional:      General: He is not in acute distress.  Appearance: Normal appearance. He is not ill-appearing.  HENT:     Head: Normocephalic and atraumatic.     Nose: Nose normal.     Mouth/Throat:     Mouth: Mucous membranes are moist.     Comments: Some bruising around nose No septal hematomas  Eyes:     General: No scleral icterus.       Right eye: No discharge.        Left eye: No discharge.     Conjunctiva/sclera: Conjunctivae normal.  Cardiovascular:     Rate and Rhythm: Normal rate and regular rhythm.     Pulses: Normal pulses.     Heart sounds: Normal heart sounds.  Pulmonary:     Effort: Pulmonary effort is normal. No respiratory distress.     Breath sounds: No stridor. No wheezing.  Abdominal:     Palpations: Abdomen is soft.     Tenderness: There is no abdominal  tenderness.  Musculoskeletal:     Cervical back: Normal range of motion.     Right lower leg: No edema.     Left lower leg: No edema.     Comments: Left upper extremity and right upper extremity normal Right lower extremity normal  Left lower extremity: Deformity of left ankle with DP PT pulses normal sensation normal    Skin:    General: Skin is warm and dry.     Capillary Refill: Capillary refill takes less than 2 seconds.  Neurological:     Mental Status: He is alert and oriented to person, place, and time. Mental status is at baseline.  Psychiatric:        Mood and Affect: Mood normal.        Behavior: Behavior normal.     (all labs ordered are listed, but only abnormal results are displayed) Labs Reviewed  COMPREHENSIVE METABOLIC PANEL WITH GFR - Abnormal; Notable for the following components:      Result Value   Glucose, Bld 103 (*)    All other components within normal limits  I-STAT CHEM 8, ED - Abnormal; Notable for the following components:   Glucose, Bld 104 (*)    All other components within normal limits  CBC    EKG: None  Radiology: DG Ankle 2 Views Left Result Date: 09/05/2024 EXAM: 2 VIEW(S) XRAY OF THE LEFT ANKLE 09/05/2024 02:24:31 PM CLINICAL HISTORY: post reduction - confirm talus reduction COMPARISON: None available. FINDINGS: BONES AND JOINTS: Minimally displaced posterior malleolar fracture. Minimally displaced distal fibular diaphyseal fracture. Reduced ankle dislocation. SOFT TISSUES: Cast placement noted. IMPRESSION: 1. Reduced ankle dislocation. 2. Minimally displaced posterior malleolar fracture. 3. Minimally displaced distal fibular diaphyseal fracture. Electronically signed by: Waddell Calk MD 09/05/2024 02:46 PM EST RP Workstation: [id number]   CT Cervical Spine Wo Contrast Result Date: 09/05/2024 EXAM: CT CERVICAL SPINE WITHOUT CONTRAST 09/05/2024 01:06:00 PM TECHNIQUE: CT of the cervical spine was performed without the administration of  intravenous contrast. Multiplanar reformatted images are provided for review. Automated exposure control, iterative reconstruction, and/or weight based adjustment of the mA/kV was utilized to reduce the radiation dose to as low as reasonably achievable. COMPARISON: None available. CLINICAL HISTORY: Neck trauma, midline tenderness (Age 36-64y). FINDINGS: BONES AND ALIGNMENT: No acute fracture or traumatic malalignment. DEGENERATIVE CHANGES: Mild degenerative changes of the spine. SOFT TISSUES: No prevertebral soft tissue swelling. IMPRESSION: 1. No evidence of acute traumatic injury. Electronically signed by: Morgane Naveau MD 09/05/2024 01:23 PM EST RP Workstation: [id number]   CT Head Wo Contrast Result  Date: 09/05/2024 EXAM: CT HEAD WITHOUT CONTRAST 09/05/2024 01:06:00 PM TECHNIQUE: CT of the head was performed without the administration of intravenous contrast. Automated exposure control, iterative reconstruction, and/or weight based adjustment of the mA/kV was utilized to reduce the radiation dose to as low as reasonably achievable. COMPARISON: None available. CLINICAL HISTORY: Head trauma, moderate-severe. FINDINGS: BRAIN AND VENTRICLES: No acute hemorrhage. No evidence of acute infarct. No hydrocephalus. No extra-axial collection. No mass effect or midline shift. ORBITS: No acute abnormality. SINUSES: No acute abnormality. SOFT TISSUES AND SKULL: No acute soft tissue abnormality. No skull fracture. IMPRESSION: 1. No acute intracranial abnormality. Electronically signed by: Morgane Naveau MD 09/05/2024 01:22 PM EST RP Workstation: [id number]   DG Pelvis 1-2 Views Result Date: 09/05/2024 EXAM: 1 or 2 VIEW(S) XRAY OF THE PELVIS 09/05/2024 01:00:00 PM COMPARISON: CT abd/pelvis 10/08/2020. CLINICAL HISTORY: fall FINDINGS: BONES AND JOINTS: No acute fracture. No malalignment. SOFT TISSUES: The soft tissues are unremarkable. IMPRESSION: 1. No evidence of acute traumatic injury. Electronically signed by: Morgane  Naveau MD 09/05/2024 01:21 PM EST RP Workstation: [id number]   DG Ankle Complete Left Result Date: 09/05/2024 EXAM: 3 OR MORE VIEW(S) XRAY OF THE LEFT ANKLE 09/05/2024 01:00:00 PM CLINICAL HISTORY: fall COMPARISON: None available. FINDINGS: BONES AND JOINTS: Acute displaced and posteriorly displaced transsyndesmotic distal fibular fracture. Possible avulsion fracture of the medial malleolus. Possible posterior malleolar fracture. Ankle dislocation with talar neck posteriorly and laterally displaced in relation to tibial plafond. Talus dislocated in posterior direction with widening of medial clear space. Plantar calcaneal spur. Ankle effusion. No definite talar fracture; however, not excluded. Limited evaluation due to overlapping osseous structures and overlying soft tissues. SOFT TISSUES: Subcutaneous soft tissue edema. IMPRESSION: 1. Ankle dislocation with posterior and lateral displacement of the talus relative to the tibial plafond and widening of the medial clear space, without definite talar fracture identified. 2. Acute displaced transsyndesmotic distal fibular fracture. 3. Possible avulsion fracture of the medial malleolus and possible posterior malleolar fracture. 4. Ankle effusion and subcutaneous soft tissue edema. Electronically signed by: Morgane Naveau MD 09/05/2024 01:20 PM EST RP Workstation: [id number]   DG Foot Complete Left Result Date: 09/05/2024 EXAM: 3 OR MORE VIEW(S) XRAY OF THE LEFT FOOT 09/05/2024 01:00:00 PM COMPARISON: None available. CLINICAL HISTORY: fall FINDINGS: BONES AND JOINTS: No acute fracture. Mild hallux valgus deformity. Plantar calcaneal spur. Ankle fracture dislocation and effusion better evaluated on x-ray left ankle 09/05/2024. SOFT TISSUES: The soft tissues are unremarkable. IMPRESSION: 1. No acute osseous abnormality of the left foot. 2. Reported ankle fracture-dislocation and effusion are better evaluated on dedicated left ankle radiographs. Electronically signed  by: Morgane Naveau MD 09/05/2024 01:16 PM EST RP Workstation: [id number]   DG Knee Complete 4 Views Left Result Date: 09/05/2024 EXAM: 4 OR MORE VIEW(S) XRAY OF THE LEFT KNEE 09/05/2024 01:00:00 PM COMPARISON: None available. CLINICAL HISTORY: fall FINDINGS: BONES AND JOINTS: Indeterminate partially visualized proximal to mid tibial cortical irregularity. No malalignment. No significant joint effusion. SOFT TISSUES: The soft tissues are unremarkable. IMPRESSION: 1. Indeterminate partially visualized proximal to mid tibial cortical irregularity. Electronically signed by: Morgane Naveau MD 09/05/2024 01:14 PM EST RP Workstation: [id number]   CT Maxillofacial Wo Contrast Result Date: 09/05/2024 EXAM: CT Face without contrast 09/05/2024 01:06:00 PM TECHNIQUE: CT of the face was performed without the administration of intravenous contrast. Multiplanar reformatted images are provided for review. Automated exposure control, iterative reconstruction, and/or weight based adjustment of the mA/kV was utilized to reduce the radiation dose to as low as reasonably  achievable. COMPARISON: None available CLINICAL HISTORY: Facial trauma, blunt. FINDINGS: AERODIGESTIVE TRACT: No mass. No edema. SALIVARY GLANDS: No acute abnormality. LYMPH NODES: No suspicious cervical lymphadenopathy. SOFT TISSUES: No mass or fluid collection. BRAIN, ORBITS AND SINUSES: No acute abnormality. BONES: Age indeterminate bilateral displaced nasal bone fractures (6:7-9). No suspicious bone lesion. IMPRESSION: 1. Age indeterminate bilateral displaced nasal bone fractures. Correlate with point tenderness to palpation in the medial compartment. Electronically signed by: Morgane Naveau MD 09/05/2024 01:11 PM EST RP Workstation: [id number]   DG Chest 2 View Result Date: 09/05/2024 EXAM: 2 VIEW(S) XRAY OF THE CHEST 09/05/2024 01:00:00 PM COMPARISON: Chest x-ray at 08/05/2019. CLINICAL HISTORY: fall FINDINGS: LUNGS AND PLEURA: Low lung volume. No focal  pulmonary opacity. No pleural effusion. No pneumothorax. HEART AND MEDIASTINUM: No acute abnormality of the cardiac and mediastinal silhouettes. BONES AND SOFT TISSUES: No acute osseous abnormality. IMPRESSION: 1. No acute process. 2. Low lung volume. Electronically signed by: Morgane Naveau MD 09/05/2024 01:09 PM EST RP Workstation: [id number]     .Reduction of fracture  Date/Time: 09/05/2024 2:20 PM  Performed by: Neldon Hamp RAMAN, PA Authorized by: Neldon Hamp RAMAN, PA  Consent: Verbal consent obtained Risks and benefits: risks, benefits and alternatives were discussed Consent given by: patient Patient understanding: patient states understanding of the procedure being performed Patient consent: the patient's understanding of the procedure matches consent given Relevant documents: relevant documents present and verified Test results: test results available and properly labeled Imaging studies: imaging studies available Patient identity confirmed: verbally with patient and arm band Local anesthesia used: no  Anesthesia: Local anesthesia used: no  Sedation: Patient sedated: no  Patient tolerance: patient tolerated the procedure well with no immediate complications Comments: Reduced left ankle under propofol  sedation.   SABRASplint Application  Date/Time: 09/05/2024 2:21 PM  Performed by: Neldon Hamp RAMAN, PA Authorized by: Neldon Hamp RAMAN, PA   Consent:    Consent obtained:  Verbal   Consent given by:  Patient   Risks, benefits, and alternatives were discussed: yes     Risks discussed:  Discoloration, numbness, pain and swelling   Alternatives discussed:  No treatment Universal protocol:    Procedure explained and questions answered to patient or proxy's satisfaction: yes     Relevant documents present and verified: yes     Test results available: yes     Imaging studies available: yes     Required blood products, implants, devices, and special equipment available: yes      Site/side marked: yes     Immediately prior to procedure a time out was called: yes     Patient identity confirmed:  Verbally with patient and arm band Pre-procedure details:    Distal neurologic exam:  Normal   Distal perfusion: distal pulses strong and brisk capillary refill   Procedure details:    Location:  Ankle   Ankle location:  L ankle   Cast type:  Short leg   Supplies:  Plaster   Attestation: Splint applied and adjusted personally by me   Post-procedure details:    Distal perfusion: brisk capillary refill     Procedure completion:  Tolerated   Post-procedure imaging: reviewed     SPLINT APPLICATION Date/Time: 3:16 PM Authorized by: Hamp RAMAN Neldon Consent: Verbal consent obtained. Risks and benefits: risks, benefits and alternatives were discussed Consent given by: patient Splint applied by: me + orthopedic technician Location details: LLE Splint type: posterior leg w stirups  Supplies used: plaster, bulky padding  Post-procedure: The splinted  body part was neurovascularly unchanged following the procedure. Patient tolerance: Patient tolerated the procedure well with no immediate complications.    Medications Ordered in the ED  oxyCODONE -acetaminophen  (PERCOCET/ROXICET) 5-325 MG per tablet 2 tablet (2 tablets Oral Given 09/05/24 1420)  morphine  (PF) 4 MG/ML injection 4 mg (4 mg Intravenous Given 09/05/24 1316)  propofol  (DIPRIVAN ) 10 mg/mL bolus/IV push 43.7 mg (43.7 mg Intravenous Given 09/05/24 1354)  ondansetron  (ZOFRAN ) injection 4 mg (4 mg Intravenous Given 09/05/24 1324)  fentaNYL  (SUBLIMAZE ) injection 100 mcg (100 mcg Intravenous Given 09/05/24 1341)  lactated ringers  bolus 1,000 mL (1,000 mLs Intravenous New Bag/Given 09/05/24 1417)    Clinical Course as of 09/05/24 1516  Thu Sep 05, 2024  1326 IMPRESSION: 1. Ankle dislocation with posterior and lateral displacement of the talus relative to the tibial plafond and widening of the medial clear space, without definite  talar fracture identified. 2. Acute displaced transsyndesmotic distal fibular fracture. 3. Possible avulsion fracture of the medial malleolus and possible posterior malleolar fracture. 4. Ankle effusion and subcutaneous soft tissue edema.   [WF]  1447 Dr. Reyne will evaluate pt at bedside (ortho) to consider surgery today.  [WF]    Clinical Course User Index [WF] Neldon Hamp RAMAN, PA                                 Medical Decision Making Amount and/or Complexity of Data Reviewed Labs: ordered. Radiology: ordered.  Risk Prescription drug management.   Patient here after mechanical fall causing a ankle dislocation fracture injury.  This was reduced under procedural sedation with my supervising physician Dr. Bernard.  Discussed the case with Dr. Reyne of orthopedic surgery who will evaluate patient at bedside.  Seems that they will be offering surgery today.  Propofol  was used for procedural sedation, patient Toller procedure well, patient splinted postreduction.  Distally neurovascular intact after reduction.  Patient care handed off to oncoming team to follow-up on orthopedic recommendations - may end up doing surgery today.   Final diagnoses:  Fall, initial encounter  Closed fracture of nasal bone, initial encounter    ED Discharge Orders     None          Neldon Hamp RAMAN, [place] 09/05/24 1517    Bernard Drivers, MD 09/05/24 1525  "

## 2024-09-05 NOTE — ED Triage Notes (Signed)
 Pt here via [facility] from home where he slipped while carrying a chair up stairs and fell down 4 feet of stairs.  Witnessed by wife.  No loc.  Initially pt told Fire that he hit the back of his head.  Upon arrival of EMS, pt was pale, diaphoretic and answering appropriately.    No hematomas noted to back of head.  Bruising to nose and continued bleeding from nose.  Obvious deformity to L ankle.  Pt appears to be getting more confused.  Pointing to ankle, but unable to explain situation, though he is able to answer loc questions appropriately.  PERRL.  Given 50 mcg fentanyl   Cbg 122 Hr 88 Bp 140/60 95%  Rr 16

## 2024-09-05 NOTE — Progress Notes (Signed)
 Orthopedic Tech Progress Note Patient Details:  Jeff Barnes 06/17/1971 [id number]  Ortho Devices Type of Ortho Device: Post (short leg) splint Ortho Device/Splint Location: LLE Ortho Device/Splint Interventions: Ordered, Application   Post Interventions Patient Tolerated: Well Instructions Provided: Care of device  Jeff Barnes [name] 09/05/2024, 2:19 PM

## 2024-09-05 NOTE — ED Notes (Signed)
 Patient given crutches and educated on how to utilize it. Patient verbalized understanding.

## 2024-09-05 NOTE — Discharge Instructions (Addendum)
 Please follow-up closely with orthopedics on outpatient basis.  Please call to ensure that you have an appointment scheduled with them.  Return to emergency department immediately for any new or worsening symptoms.  Please leave the splint in place until evaluated by orthopedics.  You are also noted to have a mild fracture to your nasal bone.  Resources for follow-up with ENT were provided.

## 2024-09-06 ENCOUNTER — Inpatient Hospital Stay (HOSPITAL_COMMUNITY): Admission: RE | Admit: 2024-09-06 | Source: Home / Self Care

## 2024-09-06 ENCOUNTER — Encounter (HOSPITAL_COMMUNITY): Admission: RE | Payer: Self-pay | Source: Home / Self Care

## 2024-09-06 SURGERY — OPEN REDUCTION INTERNAL FIXATION (ORIF) ANKLE FRACTURE
Anesthesia: General | Site: Ankle | Laterality: Left

## 2024-09-09 ENCOUNTER — Ambulatory Visit: Attending: Cardiology

## 2024-09-09 ENCOUNTER — Telehealth (HOSPITAL_BASED_OUTPATIENT_CLINIC_OR_DEPARTMENT_OTHER): Payer: Self-pay | Admitting: *Deleted

## 2024-09-09 DIAGNOSIS — Z01810 Encounter for preprocedural cardiovascular examination: Secondary | ICD-10-CM | POA: Diagnosis not present

## 2024-09-09 NOTE — Telephone Encounter (Signed)
"  ° °  Pre-operative Risk Assessment    Patient Name: Jeff Barnes  DOB: [birth year] MRN: [id number]   Date of last office visit: 11/01/23 Jeff BEAUVAIS, FNP Date of next office visit: NONE   Request for Surgical Clearance    Procedure:  ORIF LEFT ANKLE WITH LIGAMENT REPAIR  Date of Surgery:  Clearance 09/17/24                                Surgeon:  DR. EVALENE CHANCY Surgeon's Group or Practice Name:  [facility] Phone number:  [phone] [facility] Fax number:  [phone]   Type of Clearance Requested:   - Medical  - Pharmacy:  Hold Aspirin      Type of Anesthesia:  CHOICE   Additional requests/questions:    Jeff Barnes   09/09/2024, 12:43 PM   "

## 2024-09-09 NOTE — Telephone Encounter (Signed)
 Pt has been added on today ok per Katlyn West, NP. Pt broken ankle surgery 09/17/24 and ASA hold. Med rec and consent are done.     Patient Consent for Virtual Visit        Jeff Barnes has provided verbal consent on 09/09/2024 for a virtual visit (video or telephone).   CONSENT FOR VIRTUAL VISIT FOR:  Jeff Barnes  By participating in this virtual visit I agree to the following:  I hereby voluntarily request, consent and authorize [facility] and its employed or contracted physicians, physician assistants, nurse practitioners or other licensed health care professionals (the Practitioner), to provide me with telemedicine health care services (the Services) as deemed necessary by the treating Practitioner. I acknowledge and consent to receive the Services by the Practitioner via telemedicine. I understand that the telemedicine visit will involve communicating with the Practitioner through live audiovisual communication technology and the disclosure of certain medical information by electronic transmission. I acknowledge that I have been given the opportunity to request an in-person assessment or other available alternative prior to the telemedicine visit and am voluntarily participating in the telemedicine visit.  I understand that I have the right to withhold or withdraw my consent to the use of telemedicine in the course of my care at any time, without affecting my right to future care or treatment, and that the Practitioner or I may terminate the telemedicine visit at any time. I understand that I have the right to inspect all information obtained and/or recorded in the course of the telemedicine visit and may receive copies of available information for a reasonable fee.  I understand that some of the potential risks of receiving the Services via telemedicine include:  Delay or interruption in medical evaluation due to technological equipment failure or  disruption; Information transmitted may not be sufficient (e.g. poor resolution of images) to allow for appropriate medical decision making by the Practitioner; and/or  In rare instances, security protocols could fail, causing a breach of personal health information.  Furthermore, I acknowledge that it is my responsibility to provide information about my medical history, conditions and care that is complete and accurate to the best of my ability. I acknowledge that Practitioner's advice, recommendations, and/or decision may be based on factors not within their control, such as incomplete or inaccurate data provided by me or distortions of diagnostic images or specimens that may result from electronic transmissions. I understand that the practice of medicine is not an exact science and that Practitioner makes no warranties or guarantees regarding treatment outcomes. I acknowledge that a copy of this consent can be made available to me via my patient portal [facility] MyChart), or I can request a printed copy by calling the office of [facility].    I understand that my insurance will be billed for this visit.   I have read or had this consent read to me. I understand the contents of this consent, which adequately explains the benefits and risks of the Services being provided via telemedicine.  I have been provided ample opportunity to ask questions regarding this consent and the Services and have had my questions answered to my satisfaction. I give my informed consent for the services to be provided through the use of telemedicine in my medical care

## 2024-09-09 NOTE — Telephone Encounter (Signed)
" ° °  Name: Jeff Barnes  DOB: [birth year]  MRN: [id number]  Primary Cardiologist: Soyla DELENA Merck, MD   Preoperative team, please contact this patient and set up a phone call appointment ASAP as procedure is on 09/17/2024, for further preoperative risk assessment. Please obtain consent and complete medication review. Thank you for your help.  I confirm that guidance regarding antiplatelet and oral anticoagulation therapy has been completed and, if necessary, noted below.  Per office protocol, if patient is without any new symptoms or concerns at the time of their virtual visit, he may hold aspirin  for 7 days prior to procedure. Please resume aspirin  as soon as possible postprocedure, at the discretion of the surgeon.    I also confirmed the patient resides in the state of [place] . As per [facility] Medical Board telemedicine laws, the patient must reside in the state in which the provider is licensed.   Lamarr Satterfield, NP 09/09/2024, 12:55 PM [facility]    "

## 2024-09-09 NOTE — Telephone Encounter (Signed)
 Pt has been added on today ok per Katlyn West, NP. Pt broken ankle surgery 09/17/24 and ASA hold. Med rec and consent are done.

## 2024-09-09 NOTE — Progress Notes (Signed)
 "   Virtual Visit via Telephone Note   Because of Terri A Cahalan co-morbid illnesses, he is at least at moderate risk for complications without adequate follow up.  This format is felt to be most appropriate for this patient at this time.  Due to technical limitations with video connection web designer), today's appointment will be conducted as an audio only telehealth visit, and Jeff Barnes verbally agreed to proceed in this manner.   All issues noted in this document were discussed and addressed.  No physical exam could be performed with this format.  Evaluation Performed:  Preoperative cardiovascular risk assessment _____________   Date:  09/09/2024   Patient ID:  Jeff Barnes, DOB 1970/12/23, MRN [id number] Patient Location:  Home Provider location:   Office  Primary Care Provider:  Claudene Pellet, Jeff Barnes Primary Cardiologist:  Jeff DELENA Merck, Jeff Barnes Chief Complaint / Patient Profile  54 y.o. y/o male with a h/o CAD s/p NSTEMI in 08/2019, HFrEF with recovered EF and hyperlipidemia who is pending ORIF left ankle with ligament repair and presents today for telephonic preoperative cardiovascular risk assessment. History of Present Illness   Jeff Barnes is a 54 y.o. male who presents via audio/video conferencing for a telehealth visit today.  Pt was last seen in cardiology clinic on 11/01/23 by Jeff Beauvais, Jeff Barnes.  At that time Jeff Barnes was doing well.  The patient is now pending procedure as outlined above. Since his last visit, he has remained stable from a cardiac standpoint. Today he denies chest pain, shortness of breath, lower extremity edema, fatigue, palpitations, melena, hematuria, hemoptysis, diaphoresis, weakness, presyncope, syncope, orthopnea, and PND. A week ago, prior to his fall he had gone hiking and tolerated well, prior to his mechanical fall he was regularly exercising and tolerated well, able to achieve greater than 4 METs of activity.  Past Medical  History    Past Medical History:  Diagnosis Date   Cancer [facility])    skin cancer on left face   Coronary artery disease    GERD (gastroesophageal reflux disease)    H/O scarlet fever    Myocardial infarction [facility])    November   Pneumonia    Past Surgical History:  Procedure Laterality Date   BACK SURGERY     CARDIAC CATHETERIZATION     COLONOSCOPY WITH PROPOFOL  N/A 05/31/2021   Procedure: COLONOSCOPY WITH PROPOFOL ;  Surgeon: Mansouraty, Jeff Raddle., Jeff Barnes;  Location: [facility];  Service: Gastroenterology;  Laterality: N/A;   COLONOSCOPY WITH PROPOFOL  N/A 09/22/2022   Procedure: COLONOSCOPY WITH PROPOFOL ;  Surgeon: Mansouraty, Jeff Raddle., Jeff Barnes;  Location: [facility];  Service: Gastroenterology;  Laterality: N/A;   CORONARY STENT INTERVENTION  08/06/2019   CORONARY STENT INTERVENTION N/A 08/06/2019   Procedure: CORONARY STENT INTERVENTION;  Surgeon: Burnard Debby DELENA, Jeff Barnes;  Location: [facility];  Service: Cardiovascular;  Laterality: N/A;   ENDOSCOPIC MUCOSAL RESECTION N/A 05/31/2021   Procedure: ENDOSCOPIC MUCOSAL RESECTION;  Surgeon: Wilhelmenia Jeff Raddle., Jeff Barnes;  Location: [facility];  Service: Gastroenterology;  Laterality: N/A;   HEMOSTASIS CLIP PLACEMENT  05/31/2021   Procedure: HEMOSTASIS CLIP PLACEMENT;  Surgeon: Wilhelmenia Jeff Raddle., Jeff Barnes;  Location: [facility];  Service: Gastroenterology;;   INGUINAL HERNIA REPAIR Right 03/05/2020   Procedure: RIGHT OPEN INGUINAL HERNIA REPAIR WITH MESH;  Surgeon: Kinsinger, Herlene Righter, Jeff Barnes;  Location: [facility];  Service: General;  Laterality: Right;   LEFT HEART CATH AND CORONARY ANGIOGRAPHY N/A 08/06/2019   Procedure: LEFT HEART CATH AND CORONARY ANGIOGRAPHY;  Surgeon: Burnard Debby LABOR, Jeff Barnes;  Location: [facility] INVAS[facility] CV LAB;  Service: Cardiovascular;  Laterality: N/A;   POLYPECTOMY  05/31/2021   Procedure: POLYPECTOMY;  Surgeon: Wilhelmenia Jeff Raddle., Jeff Barnes;  Location: [facility];  Service: Gastroenterology;;   POLYPECTOMY  09/22/2022   Procedure:  POLYPECTOMY;  Surgeon: Wilhelmenia Jeff Raddle., Jeff Barnes;  Location: [facility];  Service: Gastroenterology;;   SUBMUCOSAL LIFTING INJECTION  05/31/2021   Procedure: SUBMUCOSAL LIFTING INJECTION;  Surgeon: Wilhelmenia Jeff Raddle., Jeff Barnes;  Location: [facility];  Service: Gastroenterology;;   Allergies Allergies[1] Home Medications    Prior to Admission medications  Medication Sig Start Date End Date Taking? Authorizing Provider  acetaminophen  (TYLENOL ) 500 MG tablet Take 1,000-1,500 mg by mouth 2 (two) times daily as needed for moderate pain.    Provider, Historical, Jeff Barnes  aspirin  81 MG chewable tablet Chew 1 tablet (81 mg total) by mouth daily. 11/01/23   Jeff Jeff HERO, Jeff Barnes  atorvastatin  (LIPITOR ) 80 MG tablet Take 1 tablet (80 mg total) by mouth daily. 11/01/23   Jeff Jeff HERO, Jeff Barnes  carvedilol  (COREG ) 6.25 MG tablet Take 1 tablet (6.25 mg total) by mouth 2 (two) times daily with a meal. 11/01/23   Jeff Barnes, Jeff HERO, Jeff Barnes  esomeprazole (NEXIUM) 20 MG capsule Take 20 mg by mouth daily.     Provider, Historical, Jeff Barnes  ezetimibe  (ZETIA ) 10 MG tablet Take 1 tablet (10 mg total) by mouth daily. 11/01/23   Jeff Jeff HERO, Jeff Barnes  fexofenadine (ALLEGRA) 180 MG tablet Take 180 mg by mouth in the morning.    Provider, Historical, Jeff Barnes  FIBER GUMMIES PO Take 2 tablets by mouth daily.    Provider, Historical, Jeff Barnes  fluticasone  (FLONASE ) 50 MCG/ACT nasal spray Place 2 sprays into both nostrils daily as needed for allergies or rhinitis.    Provider, Historical, Jeff Barnes  losartan  (COZAAR ) 25 MG tablet Take 1 tablet (25 mg total) by mouth daily. 11/01/23   Jeff Jeff HERO, Jeff Barnes  Melatonin 10 MG TABS Take 70-80 mg by mouth at bedtime.    Provider, Historical, Jeff Barnes  Menthol, Topical Analgesic, (BIOFREEZE EX) Apply 1 Application topically daily as needed (back pain).    Provider, Historical, Jeff Barnes  Multiple Vitamins-Minerals (HAIR SKIN & NAILS PO) Take 1 tablet by mouth daily.    Provider, Historical, Jeff Barnes  nitroGLYCERIN  (NITROSTAT ) 0.4 MG SL  tablet Place 1 tablet (0.4 mg total) under the tongue every 5 (five) minutes x 3 doses as needed for chest pain. 11/01/23   Jeff Jeff HERO, Jeff Barnes  oxyCODONE -acetaminophen  (PERCOCET/ROXICET) 5-325 MG tablet Take 1 tablet by mouth every 6 (six) hours as needed for severe pain (pain score 7-10). 09/05/24   Jeff Lonni BIRCH, Jeff Barnes  sildenafil (VIAGRA) 100 MG tablet Take 100 mg by mouth daily as needed for erectile dysfunction. 03/09/21   Provider, Historical, Jeff Barnes   Physical Exam  Vital Signs:  BRAYDEN BRODHEAD does not have vital signs available for review today. Given telephonic nature of communication, physical exam is limited. AAOx3. NAD. Normal affect.  Speech and respirations are unlabored. Accessory Clinical Findings  None Assessment & Plan   1.  Preoperative Cardiovascular Risk Assessment: Mr. Carneal perioperative risk of a major cardiac event is 6.6% according to the Revised Cardiac Risk Index (RCRI).  Therefore, he is at moderate-high risk for perioperative complications.   His functional capacity is excellent at 9.34 METs according to the Duke Activity Status Index (DASI). Recommendations: According to ACC/AHA guidelines, no further cardiovascular testing needed.  The patient may proceed to  surgery at acceptable risk.   Antiplatelet and/or Anticoagulation Recommendations: Regarding ASA therapy, we recommend continuation of ASA throughout the perioperative period.  However, if the surgeon feels that cessation of ASA is required in the perioperative period, it may be stopped 5-7 days prior to surgery with a plan to resume it as soon as felt to be feasible from a surgical standpoint in the post-operative period.   The patient was advised that if he develops new symptoms prior to surgery to contact our office to arrange for a follow-up visit, and he verbalized understanding.  A copy of this note will be routed to requesting surgeon.  Time:   Today, I have spent 10 minutes with the patient  with telehealth technology discussing medical history, symptoms, and management plan.    Emigdio Wildeman D Kalkidan Caudell, Jeff Barnes  09/09/2024, 3:48 PM      [1] No Known Allergies  "

## 2024-09-10 ENCOUNTER — Other Ambulatory Visit: Payer: Self-pay

## 2024-09-10 ENCOUNTER — Encounter (HOSPITAL_BASED_OUTPATIENT_CLINIC_OR_DEPARTMENT_OTHER): Payer: Self-pay | Admitting: Orthopedic Surgery

## 2024-09-10 NOTE — H&P (Signed)
 PREOPERATIVE H&P  Chief Complaint: LEFT ANKLE FRACTURE, LIGAMENT DISRUPTION  HPI: Jeff Barnes is a 54 y.o. male who presents with a diagnosis of LEFT ANKLE FRACTURE, LIGAMENT DISRUPTION. Symptoms are rated as moderate to severe, and have been worsening.  This is significantly impairing activities of daily living.  He has elected for surgical management.   Past Medical History:  Diagnosis Date   Cancer [facility])    skin cancer on left face   Coronary artery disease    GERD (gastroesophageal reflux disease)    H/O scarlet fever    Myocardial infarction [facility])    November   Pneumonia    Past Surgical History:  Procedure Laterality Date   BACK SURGERY     CARDIAC CATHETERIZATION     COLONOSCOPY WITH PROPOFOL  N/A 05/31/2021   Procedure: COLONOSCOPY WITH PROPOFOL ;  Surgeon: Wilhelmenia Aloha Raddle., MD;  Location: [facility];  Service: Gastroenterology;  Laterality: N/A;   COLONOSCOPY WITH PROPOFOL  N/A 09/22/2022   Procedure: COLONOSCOPY WITH PROPOFOL ;  Surgeon: Wilhelmenia Aloha Raddle., MD;  [facility];  Service: Gastroenterology;  Laterality: N/A;   CORONARY STENT INTERVENTION  08/06/2019   CORONARY STENT INTERVENTION N/A 08/06/2019   Procedure: CORONARY STENT INTERVENTION;  Surgeon: Burnard Debby DELENA, MD;  [facility];  Service: Cardiovascular;  Laterality: N/A;   ENDOSCOPIC MUCOSAL RESECTION N/A 05/31/2021   Procedure: ENDOSCOPIC MUCOSAL RESECTION;  Surgeon: Wilhelmenia Aloha Raddle., MD;  [facility];  Service: Gastroenterology;  Laterality: N/A;   HEMOSTASIS CLIP PLACEMENT  05/31/2021   Procedure: HEMOSTASIS CLIP PLACEMENT;  Surgeon: Wilhelmenia Aloha Raddle., MD;  [facility];  Service: Gastroenterology;;   INGUINAL HERNIA REPAIR Right 03/05/2020   Procedure: RIGHT OPEN INGUINAL HERNIA REPAIR WITH MESH;  Surgeon: Kinsinger, Herlene Righter, MD;  [facility]: [facility];  Service: General;  Laterality: Right;   LEFT HEART CATH AND CORONARY ANGIOGRAPHY  N/A 08/06/2019   Procedure: LEFT HEART CATH AND CORONARY ANGIOGRAPHY;  Surgeon: Burnard Debby DELENA, MD;  Location: [facility];  Service: Cardiovascular;  Laterality: N/A;   POLYPECTOMY  05/31/2021   Procedure: POLYPECTOMY;  Surgeon: Wilhelmenia Aloha Raddle., MD;  Location: [facility];  Service: Gastroenterology;;   POLYPECTOMY  09/22/2022   Procedure: POLYPECTOMY;  Surgeon: Wilhelmenia Aloha Raddle., MD;  Location: [facility];  Service: Gastroenterology;;   SUBMUCOSAL LIFTING INJECTION  05/31/2021   Procedure: SUBMUCOSAL LIFTING INJECTION;  Surgeon: Wilhelmenia Aloha Raddle., MD;  Location: [facility];  Service: Gastroenterology;;   Social History   Socioeconomic History   Marital status: Married    Spouse name: Not on file   Number of children: 2   Years of education: 16   Highest education level: Bachelor's degree (e.g., BA, AB, BS)  Occupational History   Not on file  Tobacco Use   Smoking status: Never   Smokeless tobacco: Never  Vaping Use   Vaping status: Never Used  Substance and Sexual Activity   Alcohol use: Yes    Comment: occas.   Drug use: Never   Sexual activity: Yes  Other Topics Concern   Not on file  Social History Narrative   Not on file   Social Drivers of Health   Tobacco Use: Low Risk (09/10/2024)   Patient History    Smoking Tobacco Use: Never    Smokeless Tobacco Use: Never    Passive Exposure: Not on file  Financial Resource Strain: Not on file  Food Insecurity: Not on file  Transportation Needs: Not on file  Physical  Activity: Not on file  Stress: Not on file  Social Connections: Not on file  Depression (EYV7-0): Not on file  Alcohol Screen: Not on file  Housing: Not on file  Utilities: Not on file  Health Literacy: Not on file   Family History  Problem Relation Age of Onset   Cancer Father    CAD Father    Colon cancer Neg Hx    Esophageal cancer Neg Hx    Inflammatory bowel disease Neg Hx    Liver disease Neg Hx    Pancreatic  cancer Neg Hx    Stomach cancer Neg Hx    Rectal cancer Neg Hx    Allergies[1] Prior to Admission medications  Medication Sig Start Date End Date Taking? Authorizing Provider  aspirin  81 MG chewable tablet Chew 1 tablet (81 mg total) by mouth daily. 11/01/23  Yes Emelia Josefa HERO, NP  atorvastatin  (LIPITOR ) 80 MG tablet Take 1 tablet (80 mg total) by mouth daily. 11/01/23  Yes Emelia Josefa HERO, NP  carvedilol  (COREG ) 6.25 MG tablet Take 1 tablet (6.25 mg total) by mouth 2 (two) times daily with a meal. 11/01/23  Yes Cleaver, Josefa HERO, NP  esomeprazole (NEXIUM) 20 MG capsule Take 20 mg by mouth daily.    Yes [provider]  ezetimibe  (ZETIA ) 10 MG tablet Take 1 tablet (10 mg total) by mouth daily. 11/01/23  Yes Emelia Josefa HERO, NP  fexofenadine (ALLEGRA) 180 MG tablet Take 180 mg by mouth in the morning.   Yes [provider]  losartan  (COZAAR ) 25 MG tablet Take 1 tablet (25 mg total) by mouth daily. 11/01/23  Yes Cleaver, Josefa HERO, NP  Multiple Vitamins-Minerals (HAIR SKIN & NAILS PO) Take 1 tablet by mouth daily.   Yes [provider]  acetaminophen  (TYLENOL ) 500 MG tablet Take 1,000-1,500 mg by mouth 2 (two) times daily as needed for moderate pain.    [provider]  FIBER GUMMIES PO Take 2 tablets by mouth daily.    [provider]  fluticasone  (FLONASE ) 50 MCG/ACT nasal spray Place 2 sprays into both nostrils daily as needed for allergies or rhinitis.    [provider]  Melatonin 10 MG TABS Take 70-80 mg by mouth at bedtime.    [provider]  Menthol, Topical Analgesic, (BIOFREEZE EX) Apply 1 Application topically daily as needed (back pain).    [provider]  nitroGLYCERIN  (NITROSTAT ) 0.4 MG SL tablet Place 1 tablet (0.4 mg total) under the tongue every 5 (five) minutes x 3 doses as needed for chest pain. 11/01/23   Emelia Josefa HERO, NP  oxyCODONE -acetaminophen  (PERCOCET/ROXICET) 5-325 MG tablet Take 1 tablet by mouth  every 6 (six) hours as needed for severe pain (pain score 7-10). 09/05/24   Daralene Lonni BIRCH, PA-C  sildenafil (VIAGRA) 100 MG tablet Take 100 mg by mouth daily as needed for erectile dysfunction. 03/09/21   [provider]     Positive ROS: All other systems have been reviewed and were otherwise negative with the exception of those mentioned in the HPI and as above.  Physical Exam: General: Alert, no acute distress Cardiovascular: No pedal edema Respiratory: No cyanosis, no use of accessory musculature GI: No organomegaly, abdomen is soft and non-tender Skin: No lesions in the area of chief complaint Neurologic: Sensation intact distally Psychiatric: Patient is competent for consent with normal mood and affect Lymphatic: No axillary or cervical lymphadenopathy  MUSCULOSKELETAL: TTP lateral and posterior ankle, edema present, ankle very unstable,  limited ROM, NVI   Imaging: xrays of the left ankle show a trimalleolar equivalent fracture that had to be reduced in the ER   Assessment: LEFT ANKLE FRACTURE, LIGAMENT DISRUPTION Plan: Plan for Procedures: OPEN REDUCTION INTERNAL FIXATION (ORIF) ANKLE FRACTURE   The risks benefits and alternatives were discussed with the patient including but not limited to the risks of nonoperative treatment, versus surgical intervention including infection, bleeding, nerve injury,  blood clots, cardiopulmonary complications, morbidity, mortality, among others, and they were willing to proceed.   Weightbearing: NWB LLE Orthopedic devices: splint and crutches Showering: POD 3 Dressing: reinforce PRN Medicines: ASA, Oxy, Tylenol , Mobic , Baclofen , Zofran   Discharge: home Follow up: 09/27/24 at 9:30am    Gerard CHRISTELLA Ted DEVONNA Office [phone] 09/16/2024 6:07 PM        [1] No Known Allergies

## 2024-09-10 NOTE — Progress Notes (Signed)
" °   09/10/24 1433  Pre-op Phone Call  Surgery Date Verified 09/17/24  Arrival Time Verified 0750  Surgery Location Verified [facility]  Medical History Reviewed Yes  Is the patient taking a GLP-1 receptor agonist? No  Does the patient have diabetes? No diagnosis of diabetes  Do you have a history of heart problems? Yes  Have you ever had tests on your heart? Yes  What cardiac tests were performed? Echo;EKG;Labs;Cardiac Cath  What date/year were cardiac tests completed? Dr. loni  Results viewable: [facility] [employer] Tab  Does patient have other implanted devices? No  Patient Teaching Enhanced Recovery;Pre / Post Procedure  Patient educated about smoking cessation 24 hours prior to surgery. N/A Non-Smoker  Patient verbalizes understanding of bowel prep? N/A  THA/TKA patients only:  By your surgery date, will you have been taking narcotics for 90 days or greater? No  Med Rec Completed Yes  Take the Following Meds the Morning of Surgery carvedilol , lipitor , zetia , allegra  Recent  Lab Work, EKG, CXR? Yes  NPO (Including gum & candy) After midnight  Allowed clear liquids Water;Gatorade  (diabetics please choose diet or no sugar options);Black Coffee Only (no creamer, milk or cream including half and half)  Patient instructed to stop clear liquids including Carb loading drink at: 0645  Stop Solids, Milk, Candy, and Gum STARTING AT MIDNIGHT  Did patient view EMMI videos? No  Responsible adult to drive and be with you for 24 hours? Yes  Name & Phone Number for Ride/Caregiver wife Jeff Barnes  No Jewelry, money, nail polish or make-up.  No lotions, powders, perfumes. No shaving  48 hrs. prior to surgery. Yes  Contacts, Dentures & Glasses Will Have to be Removed Before OR. Yes  Please bring your ID and Insurance Card the morning of your surgery. (Surgery Centers Only) Yes  Bring any papers or x-rays with you that your surgeon gave you. Yes  Instructed to contact the location of procedure/ provider if they or  anyone in their household develops symptoms or tests positive for COVID-19, has close contact with someone who tests positive for COVID, or has known exposure to any contagious illness. Yes  Call this number the morning of surgery  with any problems that may cancel your surgery. [phone]  Covid-19 Assessment  Have you had a positive COVID-19 test within the previous 90 days? No  COVID Testing Guidance Proceed with the additional questions.  Have you been unmasked and in close contact with anyone with COVID-19 or COVID-19 symptoms within the past 10 days? No  Do you or anyone in your household currently have any COVID-19 symptoms? No    "

## 2024-09-13 ENCOUNTER — Encounter (HOSPITAL_BASED_OUTPATIENT_CLINIC_OR_DEPARTMENT_OTHER)
Admission: RE | Admit: 2024-09-13 | Discharge: 2024-09-13 | Disposition: A | Source: Ambulatory Visit | Attending: Orthopedic Surgery

## 2024-09-13 DIAGNOSIS — Z01818 Encounter for other preprocedural examination: Secondary | ICD-10-CM | POA: Diagnosis present

## 2024-09-13 DIAGNOSIS — Z01812 Encounter for preprocedural laboratory examination: Secondary | ICD-10-CM | POA: Diagnosis not present

## 2024-09-13 LAB — SURGICAL PCR SCREEN
MRSA, PCR: NEGATIVE
Staphylococcus aureus: NEGATIVE

## 2024-09-16 NOTE — Anesthesia Preprocedure Evaluation (Signed)
 "                                  Anesthesia Evaluation  Patient identified by MRN, date of birth, ID band Patient awake    Reviewed: Allergy & Precautions, NPO status , Patient's Chart, lab work & pertinent test results  History of Anesthesia Complications Negative for: history of anesthetic complications  Airway Mallampati: II  TM Distance: >3 FB Neck ROM: Full    Dental no notable dental hx. (+) Teeth Intact, Dental Advisory Given   Pulmonary neg pulmonary ROS   Pulmonary exam normal breath sounds clear to auscultation       Cardiovascular hypertension, Pt. on medications and Pt. on home beta blockers + angina  + CAD and + Past MI (2020)  Normal cardiovascular exam Rhythm:Regular Rate:Normal     Neuro/Psych negative neurological ROS  negative psych ROS   GI/Hepatic Neg liver ROS,GERD  ,,  Endo/Other  negative endocrine ROS    Renal/GU negative Renal ROSLab Results      Component                Value               Date                          K                        4.3                 09/05/2024                CREATININE               0.90                09/05/2024                  negative genitourinary   Musculoskeletal negative musculoskeletal ROS (+)    Abdominal   Peds  Hematology negative hematology ROS (+) Lab Results      Component                Value               Date                      WBC                      9.2                 09/05/2024                HGB                      15.3                09/05/2024                HCT                      45.0                09/05/2024                MCV  92.2                09/05/2024                PLT                      205                 09/05/2024              Anesthesia Other Findings   Reproductive/Obstetrics                              Anesthesia Physical Anesthesia Plan  ASA: 3  Anesthesia Plan: Regional   Post-op  Pain Management: Regional block*, Minimal or no pain anticipated, Tylenol  PO (pre-op)* and Precedex    Induction: Intravenous  PONV Risk Score and Plan: 3 and Treatment may vary due to age or medical condition, Dexamethasone , Ondansetron , Propofol  infusion and TIVA  Airway Management Planned: Nasal Cannula and Natural Airway  Additional Equipment: None  Intra-op Plan:   Post-operative Plan:   Informed Consent: I have reviewed the patients History and Physical, chart, labs and discussed the procedure including the risks, benefits and alternatives for the proposed anesthesia with the patient or authorized representative who has indicated his/her understanding and acceptance.     Dental advisory given  Plan Discussed with: CRNA and Surgeon  Anesthesia Plan Comments: (L pop + L adductor + Mac)         Anesthesia Quick Evaluation  "

## 2024-09-16 NOTE — ED Provider Notes (Signed)
.  Sedation  Date/Time: 09/05/2024 1:45 PM  Performed by: Bernard Drivers, MD Authorized by: Bernard Drivers, MD   Consent:    Consent obtained:  Verbal and written   Consent given by:  Patient Universal protocol:    Immediately prior to procedure, a time out was called: yes     Patient identity confirmed:  Arm band and verbally with patient Indications:    Procedure performed:  Dislocation reduction (fracture/dislocation reduction)   Procedure necessitating sedation performed by:  Physician performing sedation Pre-sedation assessment:    Time since last food or drink:  3   ASA classification: class 2 - patient with mild systemic disease     Mallampati score:  II - soft palate, uvula, fauces visible   Neck mobility: normal     Pre-sedation assessments completed and reviewed: airway patency, cardiovascular function, hydration status, mental status, nausea/vomiting, pain level, respiratory function and temperature   A pre-sedation assessment was completed prior to the start of the procedure Immediate pre-procedure details:    Reassessment: Patient reassessed immediately prior to procedure     Reviewed: vital signs, relevant labs/tests and NPO status     Verified: bag valve mask available, emergency equipment available, intubation equipment available, IV patency confirmed and oxygen available   Procedure details (see MAR for exact dosages):    Preoxygenation:  Nasal cannula   Sedation:  Propofol    Intended level of sedation: deep   Analgesia:  Hydromorphone    Intra-procedure monitoring:  Blood pressure monitoring, cardiac monitor, continuous capnometry, continuous pulse oximetry, frequent LOC assessments and frequent vital sign checks   Intra-procedure events: none     Total Provider sedation time (minutes):  15 Post-procedure details:   A post-sedation assessment was completed following the completion of the procedure.   Attendance: Constant attendance by certified staff until patient  recovered     Recovery: Patient returned to pre-procedure baseline     Post-sedation assessments completed and reviewed: airway patency, cardiovascular function, mental status, pain level and respiratory function     Patient is stable for discharge or admission: yes     Procedure completion:  Tolerated well, no immediate complications Comments:     Procedural sedation for ankle fracture/dislocation reduction. No complications, went well. Fracture reduced. Splinted. Post sedation, pt fully awake and alert. Pain controlled. No numbness/weakness. Normal cap refill in toes, DP palp, foot nvi.     Bernard Drivers, MD 09/16/24 [phone]

## 2024-09-17 ENCOUNTER — Other Ambulatory Visit (HOSPITAL_COMMUNITY): Payer: Self-pay

## 2024-09-17 ENCOUNTER — Ambulatory Visit (HOSPITAL_BASED_OUTPATIENT_CLINIC_OR_DEPARTMENT_OTHER): Admitting: Anesthesiology

## 2024-09-17 ENCOUNTER — Encounter (HOSPITAL_BASED_OUTPATIENT_CLINIC_OR_DEPARTMENT_OTHER): Payer: Self-pay | Admitting: Orthopedic Surgery

## 2024-09-17 ENCOUNTER — Ambulatory Visit (HOSPITAL_BASED_OUTPATIENT_CLINIC_OR_DEPARTMENT_OTHER)
Admission: RE | Admit: 2024-09-17 | Discharge: 2024-09-17 | Disposition: A | Discharge status: Home / Self Care | Attending: Orthopedic Surgery | Admitting: Orthopedic Surgery

## 2024-09-17 ENCOUNTER — Encounter (HOSPITAL_BASED_OUTPATIENT_CLINIC_OR_DEPARTMENT_OTHER): Admitting: Anesthesiology

## 2024-09-17 ENCOUNTER — Other Ambulatory Visit: Payer: Self-pay

## 2024-09-17 ENCOUNTER — Ambulatory Visit (HOSPITAL_BASED_OUTPATIENT_CLINIC_OR_DEPARTMENT_OTHER)

## 2024-09-17 ENCOUNTER — Encounter (HOSPITAL_BASED_OUTPATIENT_CLINIC_OR_DEPARTMENT_OTHER)
Admission: RE | Disposition: A | Discharge status: Home / Self Care | Payer: Self-pay | Source: Home / Self Care | Attending: Orthopedic Surgery

## 2024-09-17 DIAGNOSIS — I252 Old myocardial infarction: Secondary | ICD-10-CM | POA: Insufficient documentation

## 2024-09-17 DIAGNOSIS — I10 Essential (primary) hypertension: Secondary | ICD-10-CM | POA: Diagnosis not present

## 2024-09-17 DIAGNOSIS — S93432A Sprain of tibiofibular ligament of left ankle, initial encounter: Secondary | ICD-10-CM | POA: Insufficient documentation

## 2024-09-17 DIAGNOSIS — S82852A Displaced trimalleolar fracture of left lower leg, initial encounter for closed fracture: Secondary | ICD-10-CM | POA: Diagnosis present

## 2024-09-17 DIAGNOSIS — I2510 Atherosclerotic heart disease of native coronary artery without angina pectoris: Secondary | ICD-10-CM

## 2024-09-17 DIAGNOSIS — X58XXXA Exposure to other specified factors, initial encounter: Secondary | ICD-10-CM | POA: Diagnosis not present

## 2024-09-17 DIAGNOSIS — K219 Gastro-esophageal reflux disease without esophagitis: Secondary | ICD-10-CM | POA: Diagnosis not present

## 2024-09-17 DIAGNOSIS — S82891A Other fracture of right lower leg, initial encounter for closed fracture: Secondary | ICD-10-CM | POA: Diagnosis not present

## 2024-09-17 DIAGNOSIS — S82892A Other fracture of left lower leg, initial encounter for closed fracture: Secondary | ICD-10-CM | POA: Diagnosis present

## 2024-09-17 DIAGNOSIS — Z01818 Encounter for other preprocedural examination: Secondary | ICD-10-CM

## 2024-09-17 MED ORDER — ONDANSETRON 4 MG PO TBDP
4.0000 mg | ORAL_TABLET | Freq: Three times a day (TID) | ORAL | 0 refills | Status: AC | PRN
  Filled 2024-09-17: qty 15, 5d supply, fill #0

## 2024-09-17 MED ORDER — OXYCODONE HCL 5 MG PO TABS
5.0000 mg | ORAL_TABLET | Freq: Four times a day (QID) | ORAL | 0 refills | Status: AC | PRN
  Filled 2024-09-17: qty 28, 7d supply, fill #0

## 2024-09-17 MED ORDER — CALCIUM CHLORIDE 10 % IV SOLN
INTRAVENOUS | Status: AC
  Filled 2024-09-17: qty 10

## 2024-09-17 MED ORDER — MIDAZOLAM HCL (PF) 2 MG/2ML IJ SOLN
2.0000 mg | Freq: Once | INTRAMUSCULAR | Status: AC
  Administered 2024-09-17: 2 mg via INTRAVENOUS

## 2024-09-17 MED ORDER — ONDANSETRON HCL 4 MG/2ML IJ SOLN: 4.0000 mg | Freq: Once | INTRAMUSCULAR | Status: DC | PRN

## 2024-09-17 MED ORDER — MELOXICAM 15 MG PO TABS
15.0000 mg | ORAL_TABLET | Freq: Every day | ORAL | 0 refills | Status: AC | PRN
  Filled 2024-09-17: qty 30, 30d supply, fill #0

## 2024-09-17 MED ORDER — MIDAZOLAM HCL 2 MG/2ML IJ SOLN
INTRAMUSCULAR | Status: AC
  Filled 2024-09-17: qty 2

## 2024-09-17 MED ORDER — ACETAMINOPHEN 500 MG PO TABS
1000.0000 mg | ORAL_TABLET | Freq: Once | ORAL | Status: AC
  Administered 2024-09-17: 1000 mg via ORAL

## 2024-09-17 MED ORDER — CEFAZOLIN SODIUM-DEXTROSE 2-4 GM/100ML-% IV SOLN
2.0000 g | INTRAVENOUS | Status: AC
  Administered 2024-09-17: 2 g via INTRAVENOUS

## 2024-09-17 MED ORDER — EPHEDRINE SULFATE (PRESSORS) 25 MG/5ML IV SOSY
PREFILLED_SYRINGE | INTRAVENOUS | Status: DC | PRN
  Administered 2024-09-17: 10 mg via INTRAVENOUS

## 2024-09-17 MED ORDER — FENTANYL CITRATE (PF) 100 MCG/2ML IJ SOLN
100.0000 ug | Freq: Once | INTRAMUSCULAR | Status: AC
  Administered 2024-09-17: 50 ug via INTRAVENOUS

## 2024-09-17 MED ORDER — DROPERIDOL 2.5 MG/ML IJ SOLN: 0.6250 mg | Freq: Once | INTRAMUSCULAR | Status: DC | PRN

## 2024-09-17 MED ORDER — BACLOFEN 10 MG PO TABS
10.0000 mg | ORAL_TABLET | Freq: Three times a day (TID) | ORAL | 0 refills | Status: AC | PRN
  Filled 2024-09-17: qty 21, 7d supply, fill #0

## 2024-09-17 MED ORDER — OXYCODONE HCL 5 MG PO TABS: 5.0000 mg | ORAL_TABLET | Freq: Once | ORAL | Status: DC | PRN

## 2024-09-17 MED ORDER — LACTATED RINGERS IV SOLN: INTRAVENOUS | Status: DC

## 2024-09-17 MED ORDER — ACETAMINOPHEN 10 MG/ML IV SOLN: 1000.0000 mg | Freq: Once | INTRAVENOUS | Status: DC | PRN

## 2024-09-17 MED ORDER — OXYCODONE HCL 5 MG/5ML PO SOLN: 5.0000 mg | Freq: Once | ORAL | Status: DC | PRN

## 2024-09-17 MED ORDER — ACETAMINOPHEN 500 MG PO TABS
1000.0000 mg | ORAL_TABLET | Freq: Four times a day (QID) | ORAL | 0 refills | Status: AC | PRN
  Filled 2024-09-17: qty 60, 8d supply, fill #0

## 2024-09-17 MED ORDER — ACETAMINOPHEN 500 MG PO TABS
ORAL_TABLET | ORAL | Status: AC
  Filled 2024-09-17: qty 2

## 2024-09-17 MED ORDER — ONDANSETRON HCL 4 MG/2ML IJ SOLN
INTRAMUSCULAR | Status: DC | PRN
  Administered 2024-09-17: 4 mg via INTRAVENOUS

## 2024-09-17 MED ORDER — MIDAZOLAM HCL (PF) 2 MG/2ML IJ SOLN
INTRAMUSCULAR | Status: DC | PRN
  Administered 2024-09-17: 1 mg via INTRAVENOUS

## 2024-09-17 MED ORDER — ONDANSETRON HCL 4 MG/2ML IJ SOLN
INTRAMUSCULAR | Status: AC
  Filled 2024-09-17: qty 2

## 2024-09-17 MED ORDER — PHENYLEPHRINE 80 MCG/ML (10ML) SYRINGE FOR IV PUSH (FOR BLOOD PRESSURE SUPPORT)
PREFILLED_SYRINGE | INTRAVENOUS | Status: DC | PRN
  Administered 2024-09-17: 80 ug via INTRAVENOUS
  Administered 2024-09-17 (×2): 160 ug via INTRAVENOUS
  Administered 2024-09-17 (×2): 80 ug via INTRAVENOUS

## 2024-09-17 MED ORDER — DEXAMETHASONE SOD PHOSPHATE PF 10 MG/ML IJ SOLN
INTRAMUSCULAR | Status: AC
  Filled 2024-09-17: qty 1

## 2024-09-17 MED ORDER — CEFAZOLIN SODIUM-DEXTROSE 2-4 GM/100ML-% IV SOLN
INTRAVENOUS | Status: AC
  Filled 2024-09-17: qty 100

## 2024-09-17 MED ORDER — VANCOMYCIN HCL 500 MG IV SOLR
INTRAVENOUS | Status: DC | PRN
  Administered 2024-09-17: 500 mg

## 2024-09-17 MED ORDER — DEXMEDETOMIDINE HCL IN NACL 80 MCG/20ML IV SOLN
INTRAVENOUS | Status: DC | PRN
  Administered 2024-09-17: 12 ug via INTRAVENOUS

## 2024-09-17 MED ORDER — HYDROMORPHONE HCL 1 MG/ML IJ SOLN: 0.2500 mg | INTRAMUSCULAR | Status: DC | PRN

## 2024-09-17 MED ORDER — FENTANYL CITRATE (PF) 100 MCG/2ML IJ SOLN
INTRAMUSCULAR | Status: AC
  Filled 2024-09-17: qty 2

## 2024-09-17 MED ORDER — LIDOCAINE 2% (20 MG/ML) 5 ML SYRINGE
INTRAMUSCULAR | Status: DC | PRN
  Administered 2024-09-17: 40 mg via INTRAVENOUS

## 2024-09-17 MED ORDER — EPHEDRINE 5 MG/ML INJ
INTRAVENOUS | Status: AC
  Filled 2024-09-17: qty 10

## 2024-09-17 MED ORDER — ALBUTEROL SULFATE HFA 108 (90 BASE) MCG/ACT IN AERS
INHALATION_SPRAY | RESPIRATORY_TRACT | Status: AC
  Filled 2024-09-17: qty 6.7

## 2024-09-17 MED ORDER — PROPOFOL 500 MG/50ML IV EMUL
INTRAVENOUS | Status: DC | PRN
  Administered 2024-09-17: 125 ug/kg/min via INTRAVENOUS

## 2024-09-17 MED ORDER — ASPIRIN 81 MG PO TBEC
81.0000 mg | DELAYED_RELEASE_TABLET | Freq: Two times a day (BID) | ORAL | 0 refills | Status: AC
  Filled 2024-09-17: qty 60, 30d supply, fill #0

## 2024-09-17 MED ORDER — PROPOFOL 10 MG/ML IV BOLUS
INTRAVENOUS | Status: AC
  Filled 2024-09-17: qty 20

## 2024-09-17 MED ORDER — LIDOCAINE 2% (20 MG/ML) 5 ML SYRINGE
INTRAMUSCULAR | Status: AC
  Filled 2024-09-17: qty 5

## 2024-09-17 NOTE — Anesthesia Procedure Notes (Addendum)
 Anesthesia Regional Block: Adductor canal block   Pre-Anesthetic Checklist: , timeout performed,  Correct Patient, Correct Site, Correct Laterality,  Correct Procedure, Correct Position, site marked,  Risks and benefits discussed,  Surgical consent,  Pre-op evaluation,  At surgeon's request and post-op pain management  Laterality: Lower and Left  Prep: chloraprep       Needles:  Injection technique: Single-shot  Needle Type: Echogenic Needle     Needle Length: 9cm  Needle Gauge: 22     Additional Needles:   Procedures:,,,, ultrasound used (permanent image in chart),,    Narrative:  Start time: 09/17/2024 9:42 AM End time: 09/17/2024 9:45 AM Injection made incrementally with aspirations every 5 mL.  Performed by: Personally  Anesthesiologist: Jefm Garnette LABOR, MD  Additional Notes: Block assessed prior to surgery. Pt tolerated procedure well.

## 2024-09-17 NOTE — Interval H&P Note (Signed)
 History and Physical Interval Note:  09/17/2024 9:47 AM  Jeff Barnes  has presented today for surgery, with the diagnosis of LEFT ANKLE FRACTURE, LIGAMENT DISRUPTION, HALLUX VALGUS.  The various methods of treatment have been discussed with the patient and family. After consideration of risks, benefits and other options for treatment, the patient has consented to  Procedures: OPEN REDUCTION INTERNAL FIXATION (ORIF) ANKLE FRACTURE (Left) as a surgical intervention.  The patient's history has been reviewed, patient examined, no change in status, stable for surgery.  I have reviewed the patient's chart and labs.  Questions were answered to the patient's satisfaction.     Jeff Barnes

## 2024-09-17 NOTE — Discharge Instructions (Addendum)
 POST-OPERATIVE OPIOID TAPER INSTRUCTIONS: It is important to wean off of your opioid medication as soon as possible. If you do not need pain medication after your surgery it is ok to stop day one. Opioids include: Codeine, Hydrocodone(Norco, Vicodin), Oxycodone (Percocet, oxycontin ) and hydromorphone  amongst others.  Long term and even short term use of opiods can cause: Increased pain response Dependence Constipation Depression Respiratory depression And more.  Withdrawal symptoms can include Flu like symptoms Nausea, vomiting And more Techniques to manage these symptoms Hydrate well Eat regular healthy meals Stay active Use relaxation techniques(deep breathing, meditating, yoga) Do Not substitute Alcohol to help with tapering If you have been on opioids for less than two weeks and do not have pain than it is ok to stop all together.  Plan to wean off of opioids This plan should start within one week post op of your joint replacement. Maintain the same interval or time between taking each dose and first decrease the dose.  Cut the total daily intake of opioids by one tablet each day Next start to increase the time between doses. The last dose that should be eliminated is the evening dose.      Post Anesthesia Home Care Instructions  Activity: Get plenty of rest for the remainder of the day. A responsible individual must stay with you for 24 hours following the procedure.  For the next 24 hours, DO NOT: -Drive a car -Advertising copywriter -Drink alcoholic beverages -Take any medication unless instructed by your physician -Make any legal decisions or sign important papers.  Meals: Start with liquid foods such as gelatin or soup. Progress to regular foods as tolerated. Avoid greasy, spicy, heavy foods. If nausea and/or vomiting occur, drink only clear liquids until the nausea and/or vomiting subsides. Call your physician if vomiting continues.  Special  Instructions/Symptoms: Your throat may feel dry or sore from the anesthesia or the breathing tube placed in your throat during surgery. If this causes discomfort, gargle with warm salt water. The discomfort should disappear within 24 hours.  If you had a scopolamine patch placed behind your ear for the management of post- operative nausea and/or vomiting:  1. The medication in the patch is effective for 72 hours, after which it should be removed.  Wrap patch in a tissue and discard in the trash. Wash hands thoroughly with soap and water. 2. You may remove the patch earlier than 72 hours if you experience unpleasant side effects which may include dry mouth, dizziness or visual disturbances. 3. Avoid touching the patch. Wash your hands with soap and water after contact with the patch.   Regional Anesthesia Blocks  1. You may not be able to move or feel the blocked extremity after a regional anesthetic block. This may last may last from 3-48 hours after placement, but it will go away. The length of time depends on the medication injected and your individual response to the medication. As the nerves start to wake up, you may experience tingling as the movement and feeling returns to your extremity. If the numbness and inability to move your extremity has not gone away after 48 hours, please call your surgeon.   2. The extremity that is blocked will need to be protected until the numbness is gone and the strength has returned. Because you cannot feel it, you will need to take extra care to avoid injury. Because it may be weak, you may have difficulty moving it or using it. You may not know what position it  is in without looking at it while the block is in effect.  3. For blocks in the legs and feet, returning to weight bearing and walking needs to be done carefully. You will need to wait until the numbness is entirely gone and the strength has returned. You should be able to move your leg and foot  normally before you try and bear weight or walk. You will need someone to be with you when you first try to ensure you do not fall and possibly risk injury.  4. Bruising and tenderness at the needle site are common side effects and will resolve in a few days.  5. Persistent numbness or new problems with movement should be communicated to the surgeon or the [facility] [phone] [facility] [phone]).     No Tylenol  until 3pm

## 2024-09-17 NOTE — Anesthesia Postprocedure Evaluation (Signed)
"   Anesthesia Post Note  Patient: Jeff Barnes  Procedure(s) Performed: OPEN REDUCTION INTERNAL FIXATION (ORIF) ANKLE FRACTURE (Left: Ankle)     Patient location during evaluation: PACU Anesthesia Type: Regional and MAC Level of consciousness: awake and alert Pain management: pain level controlled Vital Signs Assessment: post-procedure vital signs reviewed and stable Respiratory status: spontaneous breathing, nonlabored ventilation, respiratory function stable and patient connected to nasal cannula oxygen Cardiovascular status: stable and blood pressure returned to baseline Postop Assessment: no apparent nausea or vomiting Anesthetic complications: no   No notable events documented.  Last Vitals:  Vitals:   09/17/24 1300 09/17/24 1316  BP: 100/67 106/73  Pulse: 65 70  Resp: 13 16  Temp:  (!) 36.1 C  SpO2: 96% 97%    Last Pain:  Vitals:   09/17/24 1316  TempSrc:   PainSc: 0-No pain                 Jeff Barnes      "

## 2024-09-17 NOTE — Anesthesia Procedure Notes (Signed)
 Procedure Name: MAC Date/Time: 09/17/2024 10:40 AM  Performed by: Claudene Delon SQUIBB, CRNAPre-anesthesia Checklist: Patient identified, Emergency Drugs available, Suction available, Patient being monitored and Timeout performed Patient Re-evaluated:Patient Re-evaluated prior to induction Oxygen Delivery Method: Simple face mask Placement Confirmation: positive ETCO2 Dental Injury: Teeth and Oropharynx as per pre-operative assessment

## 2024-09-17 NOTE — Anesthesia Procedure Notes (Signed)
 Anesthesia Regional Block: Popliteal block   Pre-Anesthetic Checklist: , timeout performed,  Correct Patient, Correct Site, Correct Laterality,  Correct Procedure, Correct Position, site marked,  Risks and benefits discussed,  Pre-op evaluation,  At surgeon's request and post-op pain management  Laterality: Lower and Left  Prep: Maximum Sterile Barrier Precautions used, chloraprep       Needles:  Injection technique: Single-shot  Needle Type: Echogenic Needle     Needle Length: 9cm  Needle Gauge: 21     Additional Needles:   Procedures:,,,, ultrasound used (permanent image in chart),,    Narrative:  Start time: 09/17/2024 9:37 AM End time: 09/17/2024 9:41 AM Injection made incrementally with aspirations every 5 mL.  Performed by: Personally  Anesthesiologist: Jefm Garnette LABOR, MD  Additional Notes: Block assessed. Patient tolerated procedure well.

## 2024-09-17 NOTE — Op Note (Signed)
 09/17/2024  11:53 AM  PATIENT:  Jeff Barnes    PRE-OPERATIVE DIAGNOSIS: Tri mall equivalent ankle fracture deltoid ligament rupture.  Syndesmosis rupture.  Loose body at the medial aspect of the ankle  POST-OPERATIVE DIAGNOSIS:  Same  PROCEDURE:  OPEN REDUCTION INTERNAL FIXATION (ORIF) ANKLE FRACTURE  SURGEON:  Evalene JONETTA Chancy, MD  ASSISTANT: Gerard Large, PA-C, was present and scrubbed throughout the case, critical for completion in a timely fashion, and for retraction, instrumentation, and closure.   ANESTHESIA:   see record  PREOPERATIVE INDICATIONS:  Jeff Barnes is a  54 y.o. male with a diagnosis of LEFT ANKLE FRACTURE, LIGAMENT DISRUPTION, HALLUX VALGUS who failed conservative measures and elected for surgical management.    The risks benefits and alternatives were discussed with the patient preoperatively including but not limited to the risks of infection, bleeding, nerve injury, cardiopulmonary complications, the need for revision surgery, among others, and the patient was willing to proceed.  OPERATIVE IMPLANTS: see chart  OPERATIVE FINDINGS: Unstable ankle fracture. Stable syndesmosis post op  BLOOD LOSS: min  COMPLICATIONS: none  TOURNIQUET TIME: see record  OPERATIVE PROCEDURE:  Patient was identified in the preoperative holding area and site was marked by me He was transported to the operating theater and placed on the table in supine position taking care to pad all bony prominences. After a preincinduction time out anesthesia was induced. The left lower extremity was prepped and draped in normal sterile fashion and a pre-incision timeout was performed. Jeff Barnes received preoperative antibiotics.   I made a lateral incision of roughly 7 cm dissection was carried down sharply to the distal fibula and then spreading dissection was used proximally to protect the superficial peroneal nerve. I sharply incised the periosteum and took care to protect  the peroneal tendons. I then debrided the fracture site and performed a reduction maneuver which was held in place with a clamp.   I placed provision al fixation over the ankle fracture  I then selected an appropriate plate and placed in a neutralization fashion care was taken distally so as not to penetrate the joint with the cancellus screws.  Next I examined the posterior mall fracture I placed a K wire under fluoroscopic guidance and was happy with the position of this and reduction of the posterior mall.  I placed a partially-threaded cannulated screw and again was happy with the position and reduction of the fracture.  This provided internal fixation and stability of the Tri mall ankle fracture  I took 3+ x-rays stress views of the ankle these were reviewed by me and demonstrate instability of the syndesmosis  I then used fluoroscopic guidance to stressed the ankle which demonstrated instability of the syndesmosis I drilled a quad cortical path through the fibula then tibia and placed a tight rope which provided excellent stability of the syndesmosis  I then made a medial mall incision just distal to this protecting neurovascular structures and dissected down to the medial aspect of the ankle joint I was able to remove a small bone fragment from within the ankle joint through a medial arthrotomy I then placed a knotless  I then placed a knotless fiber tack anchor at the medial mall and secured the deltoid ligament to this repairing the deltoid ligament and providing stability on this medial aspect of the ankle  I took 4+ xrays as well as a 3+ stress xrays. These were reviewed by me and noted appropriate stability post op with good hardware  placement.   The wound was then thoroughly irrigated and closed using a 0 Vicryl and absorbable Monocryl sutures. He was placed in a short leg splint.   POST OPERATIVE PLAN: Non-weightbearing. DVT prophylaxis will consist of mobilization and chemical  px

## 2024-09-17 NOTE — Progress Notes (Signed)
Assisted Dr. Houser with left, adductor canal, popliteal, ultrasound guided block. Side rails up, monitors on throughout procedure. See vital signs in flow sheet. Tolerated Procedure well. 

## 2024-09-17 NOTE — Transfer of Care (Signed)
 Immediate Anesthesia Transfer of Care Note  Patient: Jeff Barnes  Procedure(s) Performed: OPEN REDUCTION INTERNAL FIXATION (ORIF) ANKLE FRACTURE (Left: Ankle)  Patient Location: PACU  Anesthesia Type:MAC  Level of Consciousness: drowsy  Airway & Oxygen Therapy: Patient Spontanous Breathing and Patient connected to face mask oxygen  Post-op Assessment: Report given to RN and Post -op Vital signs reviewed and stable  Post vital signs: Reviewed and stable  Last Vitals:  Vitals Value Taken Time  BP 102/68 09/17/24 12:22  Temp 36.2 C 09/17/24 12:22  Pulse 70 09/17/24 12:26  Resp 14 09/17/24 12:26  SpO2 99 % 09/17/24 12:26  Vitals shown include unfiled device data.  Last Pain:  Vitals:   09/17/24 1222  TempSrc:   PainSc: Asleep      Patients Stated Pain Goal: 4 (09/17/24 0854)  Complications: No notable events documented.

## 2024-09-27 ENCOUNTER — Encounter (HOSPITAL_BASED_OUTPATIENT_CLINIC_OR_DEPARTMENT_OTHER): Payer: Self-pay | Admitting: Orthopedic Surgery

## 2024-09-30 ENCOUNTER — Other Ambulatory Visit (HOSPITAL_COMMUNITY): Payer: Self-pay

## 2024-09-30 MED ORDER — SILDENAFIL CITRATE 100 MG PO TABS
100.0000 mg | ORAL_TABLET | ORAL | 3 refills | Status: AC | PRN
  Filled 2024-09-30: qty 30, 30d supply, fill #0

## 2024-09-30 MED ORDER — ASPIRIN 81 MG PO CHEW: 81.0000 mg | CHEWABLE_TABLET | Freq: Every day | ORAL | 3 refills | Status: AC

## 2024-09-30 MED ORDER — EZETIMIBE 10 MG PO TABS: 10.0000 mg | ORAL_TABLET | Freq: Every day | ORAL | 3 refills | Status: AC

## 2024-09-30 MED ORDER — ATORVASTATIN CALCIUM 80 MG PO TABS: 80.0000 mg | ORAL_TABLET | Freq: Every day | ORAL | 3 refills | Status: AC
